# Patient Record
Sex: Female | Born: 1960
Health system: Southern US, Community
[De-identification: ages and names within clinical notes are randomized; demographics above are authoritative.]

## PROBLEM LIST (undated history)

## (undated) DIAGNOSIS — IMO0002 Reserved for concepts with insufficient information to code with codable children: Secondary | ICD-10-CM

## (undated) DIAGNOSIS — F419 Anxiety disorder, unspecified: Secondary | ICD-10-CM

## (undated) DIAGNOSIS — E0520 Thyrotoxicosis with toxic multinodular goiter without thyrotoxic crisis or storm: Secondary | ICD-10-CM

## (undated) DIAGNOSIS — I82409 Acute embolism and thrombosis of unspecified deep veins of unspecified lower extremity: Secondary | ICD-10-CM

## (undated) DIAGNOSIS — T883XXA Malignant hyperthermia due to anesthesia, initial encounter: Secondary | ICD-10-CM

## (undated) HISTORY — PX: ADENOIDECTOMY: SHX5191

---

## 1982-09-20 HISTORY — PX: LAPAROTOMY: SHX154

## 2004-09-25 ENCOUNTER — Ambulatory Visit: Payer: Self-pay | Admitting: Internal Medicine

## 2005-11-01 ENCOUNTER — Ambulatory Visit: Payer: Self-pay | Admitting: Otolaryngology

## 2006-06-21 ENCOUNTER — Ambulatory Visit: Payer: Self-pay | Admitting: Family Medicine

## 2006-11-15 ENCOUNTER — Ambulatory Visit: Payer: Self-pay | Admitting: Internal Medicine

## 2007-10-04 DIAGNOSIS — R102 Pelvic and perineal pain unspecified side: Secondary | ICD-10-CM | POA: Insufficient documentation

## 2007-10-26 ENCOUNTER — Ambulatory Visit: Payer: Self-pay | Admitting: Urology

## 2007-11-01 ENCOUNTER — Ambulatory Visit: Payer: Self-pay | Admitting: Urology

## 2008-02-07 ENCOUNTER — Ambulatory Visit: Payer: Self-pay | Admitting: Family Medicine

## 2008-08-30 ENCOUNTER — Ambulatory Visit: Payer: Self-pay | Admitting: Family Medicine

## 2008-11-17 DIAGNOSIS — G4700 Insomnia, unspecified: Secondary | ICD-10-CM | POA: Insufficient documentation

## 2008-12-16 DIAGNOSIS — K219 Gastro-esophageal reflux disease without esophagitis: Secondary | ICD-10-CM | POA: Insufficient documentation

## 2009-04-07 ENCOUNTER — Ambulatory Visit: Payer: Self-pay | Admitting: Family Medicine

## 2009-06-17 ENCOUNTER — Ambulatory Visit: Payer: Self-pay

## 2009-07-14 ENCOUNTER — Ambulatory Visit: Payer: Self-pay | Admitting: Urology

## 2009-10-22 ENCOUNTER — Ambulatory Visit: Payer: Self-pay | Admitting: Internal Medicine

## 2009-11-23 ENCOUNTER — Emergency Department: Payer: Self-pay | Admitting: Emergency Medicine

## 2010-02-17 DIAGNOSIS — K5900 Constipation, unspecified: Secondary | ICD-10-CM | POA: Insufficient documentation

## 2010-03-24 ENCOUNTER — Ambulatory Visit: Payer: Self-pay | Admitting: Unknown Physician Specialty

## 2010-03-31 ENCOUNTER — Inpatient Hospital Stay: Payer: Self-pay | Admitting: Internal Medicine

## 2010-04-16 DIAGNOSIS — I744 Embolism and thrombosis of arteries of extremities, unspecified: Secondary | ICD-10-CM

## 2010-04-16 DIAGNOSIS — T883XXA Malignant hyperthermia due to anesthesia, initial encounter: Secondary | ICD-10-CM

## 2010-04-16 DIAGNOSIS — E559 Vitamin D deficiency, unspecified: Secondary | ICD-10-CM | POA: Insufficient documentation

## 2010-04-16 DIAGNOSIS — I742 Embolism and thrombosis of arteries of the upper extremities: Secondary | ICD-10-CM | POA: Insufficient documentation

## 2010-04-16 HISTORY — DX: Embolism and thrombosis of arteries of extremities, unspecified: I74.4

## 2010-04-16 HISTORY — DX: Malignant hyperthermia due to anesthesia, initial encounter: T88.3XXA

## 2010-04-20 ENCOUNTER — Emergency Department: Payer: Self-pay | Admitting: Emergency Medicine

## 2010-10-13 ENCOUNTER — Ambulatory Visit: Payer: Self-pay | Admitting: Internal Medicine

## 2010-10-13 ENCOUNTER — Ambulatory Visit: Payer: Self-pay | Admitting: Family Medicine

## 2010-10-29 ENCOUNTER — Ambulatory Visit: Payer: Self-pay | Admitting: Internal Medicine

## 2010-11-19 ENCOUNTER — Ambulatory Visit: Payer: Self-pay | Admitting: Internal Medicine

## 2010-12-05 LAB — CEA: CEA: 1 ng/mL (ref 0.0–4.7)

## 2010-12-05 LAB — CA 125: CA 125: 12.5 U/mL (ref 0.0–34.0)

## 2010-12-20 ENCOUNTER — Ambulatory Visit: Payer: Self-pay | Admitting: Internal Medicine

## 2011-01-13 ENCOUNTER — Ambulatory Visit: Payer: Self-pay | Admitting: Family Medicine

## 2011-01-25 ENCOUNTER — Ambulatory Visit: Payer: Self-pay

## 2012-03-15 ENCOUNTER — Ambulatory Visit: Payer: Self-pay | Admitting: Family Medicine

## 2012-03-15 DIAGNOSIS — M25559 Pain in unspecified hip: Secondary | ICD-10-CM | POA: Insufficient documentation

## 2012-09-06 LAB — HM PAP SMEAR: HM Pap smear: NEGATIVE

## 2012-09-18 ENCOUNTER — Other Ambulatory Visit: Payer: Self-pay | Admitting: Family Medicine

## 2012-09-18 NOTE — Telephone Encounter (Signed)
No paper chart °

## 2012-10-20 ENCOUNTER — Ambulatory Visit: Payer: Self-pay | Admitting: Family Medicine

## 2015-03-14 DIAGNOSIS — E0590 Thyrotoxicosis, unspecified without thyrotoxic crisis or storm: Secondary | ICD-10-CM | POA: Insufficient documentation

## 2015-03-14 DIAGNOSIS — R42 Dizziness and giddiness: Secondary | ICD-10-CM | POA: Insufficient documentation

## 2015-03-14 DIAGNOSIS — F419 Anxiety disorder, unspecified: Secondary | ICD-10-CM | POA: Insufficient documentation

## 2015-03-14 DIAGNOSIS — G7129 Other congenital myopathy: Secondary | ICD-10-CM | POA: Insufficient documentation

## 2015-03-14 DIAGNOSIS — G712 Congenital myopathies: Secondary | ICD-10-CM

## 2015-03-14 DIAGNOSIS — G7100 Muscular dystrophy, unspecified: Secondary | ICD-10-CM | POA: Insufficient documentation

## 2015-03-17 ENCOUNTER — Ambulatory Visit: Payer: Self-pay | Admitting: Physician Assistant

## 2015-03-17 ENCOUNTER — Other Ambulatory Visit: Payer: Self-pay | Admitting: Family Medicine

## 2015-03-17 DIAGNOSIS — F419 Anxiety disorder, unspecified: Secondary | ICD-10-CM

## 2015-03-17 NOTE — Telephone Encounter (Signed)
Printed, please fax or call in to pharmacy. Thank you.   

## 2015-03-25 ENCOUNTER — Telehealth: Payer: Self-pay | Admitting: Family Medicine

## 2015-03-25 DIAGNOSIS — J329 Chronic sinusitis, unspecified: Secondary | ICD-10-CM | POA: Insufficient documentation

## 2015-03-25 MED ORDER — AMOXICILLIN-POT CLAVULANATE 875-125 MG PO TABS: 1.0000 | ORAL_TABLET | Freq: Two times a day (BID) | ORAL | Status: DC

## 2015-03-25 NOTE — Telephone Encounter (Signed)
Pt advised.   Thanks,   -Kalif Kattner  

## 2015-03-25 NOTE — Telephone Encounter (Signed)
Pt is calling wanting something for sinus infection and chest cold.  Husband is at [facility] sick.  She wants to know if you can call her in something.  Her call back is [phone].  She uses [facility].  tp

## 2015-03-25 NOTE — Telephone Encounter (Signed)
Sent in rx. OV if not improved. Thanks.

## 2015-04-15 ENCOUNTER — Other Ambulatory Visit: Payer: Self-pay | Admitting: Family Medicine

## 2015-04-15 DIAGNOSIS — F419 Anxiety disorder, unspecified: Secondary | ICD-10-CM

## 2015-05-10 ENCOUNTER — Other Ambulatory Visit: Payer: Self-pay | Admitting: Family Medicine

## 2015-05-10 DIAGNOSIS — F341 Dysthymic disorder: Secondary | ICD-10-CM

## 2015-05-12 ENCOUNTER — Ambulatory Visit: Payer: Medicare Other | Admitting: Family Medicine

## 2015-05-12 NOTE — Telephone Encounter (Signed)
Last OV 04/2014 (No show 02/2015)  Thanks,   -Mickel Baas

## 2015-05-30 ENCOUNTER — Encounter: Payer: Self-pay | Admitting: Family Medicine

## 2015-05-30 ENCOUNTER — Ambulatory Visit (INDEPENDENT_AMBULATORY_CARE_PROVIDER_SITE_OTHER): Payer: Medicare Other | Admitting: Family Medicine

## 2015-05-30 ENCOUNTER — Ambulatory Visit: Payer: Medicare Other | Admitting: Family Medicine

## 2015-05-30 VITALS — BP 102/64 | HR 64 | Temp 98.4°F | Resp 16 | Ht 62.5 in | Wt 113.0 lb

## 2015-05-30 DIAGNOSIS — F419 Anxiety disorder, unspecified: Secondary | ICD-10-CM | POA: Diagnosis not present

## 2015-05-30 DIAGNOSIS — G4700 Insomnia, unspecified: Secondary | ICD-10-CM | POA: Diagnosis not present

## 2015-05-30 DIAGNOSIS — F341 Dysthymic disorder: Secondary | ICD-10-CM | POA: Diagnosis not present

## 2015-05-30 MED ORDER — CITALOPRAM HYDROBROMIDE 40 MG PO TABS: 40.0000 mg | ORAL_TABLET | Freq: Every day | ORAL | Status: DC

## 2015-05-30 MED ORDER — ALPRAZOLAM 0.5 MG PO TABS: 0.5000 mg | ORAL_TABLET | Freq: Every day | ORAL | Status: DC | PRN

## 2015-05-30 NOTE — Progress Notes (Signed)
Subjective:    Patient ID: Tara Scott, female    DOB: [birth year], 54 y.o.   MRN: [id number]  Depression      The patient presents with depression.  This is a chronic problem.  The current episode started more than 1 year ago.   The problem occurs daily.  The problem has been gradually worsening since onset.  Associated symptoms include decreased concentration and sad.  Associated symptoms include no suicidal ideas.     The symptoms are aggravated by family issues (Pt's husband recently passed away 7 weeks ago after battling liver dx).  Past treatments include SSRIs - Selective serotonin reuptake inhibitors (pt currently taking Celexa 40 mg and Xanax as noted. ).  Compliance with treatment is good.  Previous treatment provided mild relief.  Past medical history includes anxiety and depression.     Pertinent negatives include no bipolar disorder. Anxiety Presents for follow-up visit. Symptoms include decreased concentration. Patient reports no suicidal ideas. Symptoms occur constantly (pt reports she is having to take Xanax qd). The severity of symptoms is interfering with daily activities (pt reports she has not been able to go home after her husband's death. Pt currently staying with family). The symptoms are aggravated by family issues.   Her past medical history is significant for depression. Past treatments include benzodiazephines. The treatment provided mild relief. Compliance with prior treatments has been good. Compliance with medications is 76-100%.    Review of Systems  Psychiatric/Behavioral: Positive for depression and decreased concentration. Negative for suicidal ideas.   BP 102/64 mmHg  Pulse 64  Temp(Src) 98.4 F (36.9 C) (Oral)  Resp 16  Ht 5' 2.5" (1.588 m)  Wt 113 lb (51.256 kg)  BMI 20.33 kg/m2   Patient Active Problem List   Diagnosis Date Noted  . Sinusitis 03/25/2015  . Anxiety 03/14/2015  . 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 Dizziness 03/14/2015  . MD  (muscular dystrophy) 03/14/2015  . Hyperthyroidism, subclinical 03/14/2015  . Arthralgia of hip or thigh 03/15/2012  . Adverse effect of malignant hyperthermia 04/16/2010  . 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 Avitaminosis D 04/16/2010  . CN (constipation) 02/17/2010  . Goiter, nontoxic, multinodular 11/04/2009  . Absence of menstruation 08/18/2009  . Acid reflux 12/16/2008  . Cannot sleep 11/17/2008  . Adnexal pain 10/04/2007  . Depression, neurotic 09/01/2007   No past medical history on file. Current Outpatient Prescriptions on File Prior to Visit  Medication Sig  . ALPRAZolam (XANAX) 0.5 MG tablet TAKE 1 TABLET BY MOUTH EVERY DAY AS NEEDED  . citalopram (CELEXA) 40 MG tablet TAKE 1 TABLET BY MOUTH EVERY DAY FOR DEPRESSION   No current facility-administered medications on file prior to visit.   Allergies  Allergen Reactions  . Anesthetics, Halogenated Other (See Comments)  . Ciprofloxacin     swelling  . Methimazole     hives  . Penicillins Other (See Comments)    RXN AS A CHILD   Past Surgical History  Procedure Laterality Date  . Cesarean section    . Adenoidectomy    . Laparotomy  1984    OVARIAN CYST   Social History   Social History  . Marital Status: Widowed    Spouse Name: N/A  . Number of Children: N/A  . Years of Education: N/A   Occupational History  . Not on file.   Social History Main Topics  . Smoking status: Current Every Day Smoker -- 1.00 packs/day for 30  years    Types: Cigarettes  . Smokeless tobacco: Never Used  . Alcohol Use: No  . Drug Use: No  . Sexual Activity: Not on file   Other Topics Concern  . Not on file   Social History Narrative   Family History  Problem Relation Age of Onset  . CVA Mother   . Hypercholesterolemia Mother   . Depression Mother   . Rheum arthritis Mother   . Deep vein thrombosis Mother   . Bladder Cancer Father   . Diverticulitis Father   . Pulmonary embolism Father   .  Lung cancer Father   . Deep vein thrombosis Sister   . Colon polyps Sister   . Atrial fibrillation Sister       Objective:   Physical Exam  Constitutional: She is oriented to person, place, and time. She appears well-developed and well-nourished.  Neurological: She is alert and oriented to person, place, and time.  Psychiatric: She has a normal mood and affect. Her behavior is normal. Judgment and thought content normal.  BP 102/64 mmHg  Pulse 64  Temp(Src) 98.4 F (36.9 C) (Oral)  Resp 16  Ht 5' 2.5" (1.588 m)  Wt 113 lb (51.256 kg)  BMI 20.33 kg/m2     Assessment & Plan:  1. Anxiety Worsening since death of her husband. Will refill medication and monitor.  Still has not moved back home.  - ALPRAZolam (XANAX) 0.5 MG tablet; Take 1 tablet (0.5 mg total) by mouth daily as needed.  Dispense: 30 tablet; Refill: 5  2. Cannot sleep Will treat as above. Patient instructed to call back if condition worsens or does not improve.     3. Depression, neurotic Stable on current medication. Does not want to see a counselor at this time.  Stressed importance of not focusing on what ifs, but grieving the loss of her husband. Will work on this and on moving back home at some point.  Is currently staying with family. Call if does not continue to slowly improve. Also, consider a counselor.  - citalopram (CELEXA) 40 MG tablet; Take 1 tablet (40 mg total) by mouth daily.  Dispense: 30 tablet; Refill: 5  Margarita Rana, MD

## 2015-06-10 ENCOUNTER — Other Ambulatory Visit: Payer: Self-pay | Admitting: Family Medicine

## 2015-06-10 DIAGNOSIS — F419 Anxiety disorder, unspecified: Secondary | ICD-10-CM

## 2015-07-30 ENCOUNTER — Other Ambulatory Visit: Payer: Self-pay

## 2015-07-30 DIAGNOSIS — F419 Anxiety disorder, unspecified: Secondary | ICD-10-CM

## 2015-07-30 DIAGNOSIS — F341 Dysthymic disorder: Secondary | ICD-10-CM

## 2015-07-30 MED ORDER — CITALOPRAM HYDROBROMIDE 40 MG PO TABS: ORAL_TABLET | ORAL | Status: DC

## 2015-10-04 ENCOUNTER — Other Ambulatory Visit: Payer: Self-pay | Admitting: Family Medicine

## 2015-10-04 DIAGNOSIS — F419 Anxiety disorder, unspecified: Secondary | ICD-10-CM

## 2015-10-06 NOTE — Telephone Encounter (Signed)
Printed, please fax or call in to pharmacy. Thank you.   

## 2015-12-26 ENCOUNTER — Ambulatory Visit (INDEPENDENT_AMBULATORY_CARE_PROVIDER_SITE_OTHER): Payer: PPO | Admitting: Family Medicine

## 2015-12-26 ENCOUNTER — Encounter: Payer: Self-pay | Admitting: Family Medicine

## 2015-12-26 VITALS — BP 110/66 | HR 72 | Temp 98.0°F | Resp 16 | Ht 62.0 in | Wt 117.0 lb

## 2015-12-26 DIAGNOSIS — G250 Essential tremor: Secondary | ICD-10-CM | POA: Insufficient documentation

## 2015-12-26 DIAGNOSIS — Z1211 Encounter for screening for malignant neoplasm of colon: Secondary | ICD-10-CM | POA: Diagnosis not present

## 2015-12-26 DIAGNOSIS — N898 Other specified noninflammatory disorders of vagina: Secondary | ICD-10-CM

## 2015-12-26 DIAGNOSIS — F419 Anxiety disorder, unspecified: Secondary | ICD-10-CM | POA: Diagnosis not present

## 2015-12-26 DIAGNOSIS — L298 Other pruritus: Secondary | ICD-10-CM | POA: Diagnosis not present

## 2015-12-26 DIAGNOSIS — E0590 Thyrotoxicosis, unspecified without thyrotoxic crisis or storm: Secondary | ICD-10-CM

## 2015-12-26 DIAGNOSIS — Z1239 Encounter for other screening for malignant neoplasm of breast: Secondary | ICD-10-CM | POA: Diagnosis not present

## 2015-12-26 DIAGNOSIS — Z0000 Encounter for general adult medical examination without abnormal findings: Secondary | ICD-10-CM | POA: Diagnosis not present

## 2015-12-26 NOTE — Patient Instructions (Signed)
Essential Tremor A tremor is trembling or shaking that you cannot control. Most tremors affect the hands or arms. Tremors can also affect the head, vocal cords, face, and other parts of the body.  Essential tremor is a tremor without a known cause.  CAUSES Essential tremor has no known cause.  RISK FACTORS You may be at greater risk of essential tremor if:   You have a family member with essential tremor.   You are age 56 or older.   You take certain medicines. SIGNS AND SYMPTOMS The main sign of a tremor is uncontrolled and unintentional rhythmic shaking of a body part.  You may have difficulty eating with a spoon or fork.   You may have difficulty writing.   You may nod your head up and down or side to side.   You may have a quivering voice.  Your tremors:  May get worse over time.   May come and go.   May be more noticeable on one side of your body.   May get worse due to stress, fatigue, caffeine, and extreme heat or cold.  DIAGNOSIS Your health care provider can diagnose essential tremor based on your symptoms, medical history, and a physical examination. There is no single test to diagnose an essential tremor. However, your health care provider may perform a variety of tests to rule out other conditions. Tests may include:   Blood and urine tests.   Imaging studies of your brain, such as:   CT scan.   MRI.   A test that measures involuntary muscle movement (electromyogram). TREATMENT Your tremors may go away without treatment. Mild tremors may not need treatment if they do not affect your day-to-day life. Severe tremors may need to be treated using one or a combination of the following options:   Medicines. This may include medicine that is injected.  Lifestyle changes.   Physical therapy.  HOME CARE INSTRUCTIONS  Take medicines only as directed by your health care provider.   Limit alcohol intake to no more than 1 drink per day for  nonpregnant women and 2 drinks per day for men. One drink equals 12 oz of beer, 5 oz of wine, or 1 oz of hard liquor.  Do not use any tobacco products, including cigarettes, chewing tobacco, or electronic cigarettes. If you need help quitting, ask your health care provider.  Take medicines only as directed by your health care provider.   Avoid extreme heat or cold.   Limit the amount of caffeine you consumeas directed by your health care provider.   Try to get eight hours of sleep each night.  Find ways to manage your stress, such as meditation or yoga.  Keep all follow-up visits as directed by your health care provider. This is important. This includes any physical therapy visits. SEEK MEDICAL CARE IF:  You experience any changes in the location or intensity of your tremors.   You start having a tremor after starting a new medicine.   You have tremor with other symptoms such as:   Numbness.   Tingling.   Pain.   Weakness.   Your tremor gets worse.   Your tremor interferes with your daily life.    This information is not intended to replace advice given to you by your health care provider. Make sure you discuss any questions you have with your health care provider.   Document Released: 09/27/2014 Document Reviewed: 09/27/2014 Elsevier Interactive Patient Education 2016 Elsevier Inc.  

## 2015-12-26 NOTE — Progress Notes (Signed)
Patient ID: Tara Scott, female   DOB: [birth year], 55 y.o.   MRN: [id number]       Patient: Tara Scott, Female    DOB: 1961/02/23, 55 y.o.   MRN: [id number] Visit Date: 12/26/2015  Today's Provider: Margarita Rana, MD   Chief Complaint  Patient presents with  . Medicare Wellness   Subjective:    Annual wellness visit Tara Scott is a 55 y.o. female. She feels well. She reports exercising none. She reports she is sleeping poorly.  05/21/15 CPE 09/06/12 Pap-neg 01/13/11 Mammogram-BI-RADS 2  -----------------------------------------------------------   Review of Systems  Constitutional: Positive for activity change.       Crying  HENT: Negative.   Eyes: Positive for photophobia.  Respiratory: Negative.   Cardiovascular: Positive for leg swelling.  Gastrointestinal: Positive for constipation.  Endocrine: Negative.   Genitourinary: Negative.        Itching  Musculoskeletal: Positive for back pain, neck pain and neck stiffness.  Skin: Negative.   Allergic/Immunologic: Negative.   Neurological: Negative.   Hematological: Negative.   Psychiatric/Behavioral: Positive for decreased concentration.    Social History   Social History  . Marital Status: Widowed    Spouse Name: N/A  . Number of Children: N/A  . Years of Education: N/A   Occupational History  . Not on file.   Social History Main Topics  . Smoking status: Current Every Day Smoker -- 1.00 packs/day for 30 years    Types: Cigarettes  . Smokeless tobacco: Never Used  . Alcohol Use: No  . Drug Use: No  . Sexual Activity: Not on file   Other Topics Concern  . Not on file   Social History Narrative    History reviewed. No pertinent past medical history.   Patient Active Problem List   Diagnosis Date Noted  . Sinusitis 03/25/2015  . Anxiety 03/14/2015  . Central core disease ([place]) 03/14/2015  . Dizziness 03/14/2015  . MD (muscular dystrophy) ([place]) 03/14/2015  . Hyperthyroidism, subclinical  03/14/2015  . Arthralgia of hip or thigh 03/15/2012  . Adverse effect of malignant hyperthermia 04/16/2010  . 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 Avitaminosis D 04/16/2010  . CN (constipation) 02/17/2010  . Goiter, nontoxic, multinodular 11/04/2009  . Absence of menstruation 08/18/2009  . Acid reflux 12/16/2008  . Cannot sleep 11/17/2008  . Adnexal pain 10/04/2007  . Depression, neurotic 09/01/2007    Past Surgical History  Procedure Laterality Date  . Cesarean section    . Adenoidectomy    . Laparotomy  1984    OVARIAN CYST    Her family history includes Atrial fibrillation in her sister; Bladder Cancer in her father; CVA in her mother; Colon polyps in her sister; Deep vein thrombosis in her mother and sister; Depression in her mother; Diverticulitis in her father; Hypercholesterolemia in her mother; Lung cancer in her father; Pulmonary embolism in her father; Rheum arthritis in her mother.    Previous Medications   ALPRAZOLAM (XANAX) 0.5 MG TABLET    TAKE 1 TABLET BY MOUTH EVERY DAY AS NEEDED   CITALOPRAM (CELEXA) 40 MG TABLET    Take 1 tablet (40 mg total) by mouth daily.    Patient Care Team: Margarita Rana, MD as PCP - General (Family Medicine)     Objective:   Vitals: BP 110/66 mmHg  Pulse 72  Temp(Src) 98 F (36.7 C) (Oral)  Resp 16  Ht 5\' 2"  (1.575 m)  Wt 117 lb (53.071 kg)  BMI 21.39 kg/m2  SpO2 98%  Physical Exam  Constitutional: She is oriented to person, place, and time. She appears well-developed and well-nourished.  HENT:  Head: Normocephalic and atraumatic.  Right Ear: Tympanic membrane, external ear and ear canal normal.  Left Ear: Tympanic membrane, external ear and ear canal normal.  Nose: Nose normal.  Mouth/Throat: Uvula is midline, oropharynx is clear and moist and mucous membranes are normal.  Eyes: Conjunctivae, EOM and lids are normal. Pupils are equal, round, and reactive to light.  Neck: Trachea normal and normal  range of motion. Neck supple. Carotid bruit is not present. No thyroid mass and no thyromegaly present.  Cardiovascular: Normal rate, regular rhythm and normal heart sounds.   Pulmonary/Chest: Effort normal and breath sounds normal.  Abdominal: Soft. Normal appearance and bowel sounds are normal. There is no hepatosplenomegaly. There is no tenderness.  Genitourinary: No breast swelling, tenderness or discharge. There is erythema in the vagina.  Musculoskeletal: Normal range of motion.  Lymphadenopathy:    She has no cervical adenopathy.    She has no axillary adenopathy.  Neurological: She is alert and oriented to person, place, and time. She has normal strength. No cranial nerve deficit.  Skin: Skin is warm, dry and intact.  Psychiatric: She has a normal mood and affect. Her speech is normal and behavior is normal. Judgment and thought content normal. Cognition and memory are normal.    Activities of Daily Living In your present state of health, do you have any difficulty performing the following activities: 12/26/2015 12/26/2015  Hearing? N N  Vision? Y N  Difficulty concentrating or making decisions? Y N  Walking or climbing stairs? Y N  Dressing or bathing? N N  Doing errands, shopping? N N    Fall Risk Assessment Fall Risk  12/26/2015  Falls in the past year? No     Depression Screen PHQ 2/9 Scores 12/26/2015  PHQ - 2 Score 4  PHQ- 9 Score 13    Cognitive Testing - 6-CIT  Correct? Score   What year is it? yes 0 0 or 4  What month is it? yes 0 0 or 3  Memorize:    Pia Mau,  41,  [address],  [place],      What time is it? (within 1 hour) yes 0 0 or 3  Count backwards from 20 yes 0 0, 2, or 4  Name the months of the year yes 0 0, 2, or 4  Repeat name & address above no 4 0, 2, 4, 6, 8, or 10       TOTAL SCORE  4/28   Interpretation:  Normal  Normal (0-7) Abnormal (8-28)       Assessment & Plan:     Annual Wellness Visit  Reviewed patient's Family Medical  History Reviewed and updated list of patient's medical providers Assessment of cognitive impairment was done Assessed patient's functional ability Established a written schedule for health screening [place] Completed and Reviewed  Exercise Activities and Dietary recommendations Goals    None      Immunization History  Administered Date(s) Administered  . Pneumococcal Polysaccharide-23 07/27/2011       1. Medicare annual wellness visit, subsequent Stable. Patient advised to continue eating healthy and exercise daily.  2. Hyperthyroidism, subclinical - CBC with Differential/Platelet - Comprehensive metabolic panel - TSH  3. Anxiety Stable. Patient advised to continue current medication and plan of care.   4. Benign essential tremor New  problem. Improving.Suspect related to stress.  Patient advised she will follow-up with her neurologist to make sure is correct diagnosis.    5. Breast cancer screening - [facility] DIGITAL SCREENING BILATERAL; Future  6. Colon cancer screening - Ambulatory referral to Gastroenterology  7. Vaginal itching New problem. Worsening. F/U pending lab report. - NuSwab Vaginitis Plus (VG+)     Patient seen and examined by Dr. Jerrell Belfast, and note scribed by Philbert Riser. Dimas, CMA.  I have reviewed the document for accuracy and completeness and I agree with above. Jerrell Belfast, MD   Margarita Rana, MD   ------------------------------------------------------------------------------------------------------------

## 2015-12-27 LAB — COMPREHENSIVE METABOLIC PANEL
ALBUMIN: 4.4 g/dL (ref 3.5–5.5)
ALK PHOS: 80 IU/L (ref 39–117)
ALT: 20 IU/L (ref 0–32)
AST: 23 IU/L (ref 0–40)
Albumin/Globulin Ratio: 1.6 (ref 1.2–2.2)
BUN / CREAT RATIO: 32 — AB (ref 9–23)
BUN: 12 mg/dL (ref 6–24)
Bilirubin Total: 0.4 mg/dL (ref 0.0–1.2)
CO2: 27 mmol/L (ref 18–29)
CREATININE: 0.37 mg/dL — AB (ref 0.57–1.00)
Calcium: 9.5 mg/dL (ref 8.7–10.2)
Chloride: 98 mmol/L (ref 96–106)
GFR calc Af Amer: 139 mL/min/{1.73_m2} (ref >59)
GFR calc non Af Amer: 121 mL/min/{1.73_m2} (ref >59)
Globulin, Total: 2.8 g/dL (ref 1.5–4.5)
Glucose: 74 mg/dL (ref 65–99)
Potassium: 4.3 mmol/L (ref 3.5–5.2)
Sodium: 140 mmol/L (ref 134–144)
Total Protein: 7.2 g/dL (ref 6.0–8.5)

## 2015-12-27 LAB — CBC WITH DIFFERENTIAL/PLATELET
BASOS ABS: 0 10*3/uL (ref 0.0–0.2)
BASOS: 1 %
EOS (ABSOLUTE): 0 10*3/uL (ref 0.0–0.4)
EOS: 1 %
Hematocrit: 41.8 % (ref 34.0–46.6)
Hemoglobin: 14 g/dL (ref 11.1–15.9)
IMMATURE GRANS (ABS): 0 10*3/uL (ref 0.0–0.1)
Immature Granulocytes: 0 %
LYMPHS: 33 %
Lymphocytes Absolute: 1.8 10*3/uL (ref 0.7–3.1)
MCH: 32.3 pg (ref 26.6–33.0)
MCHC: 33.5 g/dL (ref 31.5–35.7)
MCV: 96 fL (ref 79–97)
MONOCYTES: 8 %
Monocytes Absolute: 0.4 10*3/uL (ref 0.1–0.9)
NEUTROS ABS: 3 10*3/uL (ref 1.4–7.0)
Neutrophils: 57 %
PLATELETS: 253 10*3/uL (ref 150–379)
RBC: 4.34 x10E6/uL (ref 3.77–5.28)
RDW: 13.1 % (ref 12.3–15.4)
WBC: 5.3 10*3/uL (ref 3.4–10.8)

## 2015-12-27 LAB — TSH: TSH: 0.016 u[IU]/mL — AB (ref 0.450–4.500)

## 2015-12-29 ENCOUNTER — Telehealth: Payer: Self-pay

## 2015-12-29 LAB — NUSWAB VAGINITIS PLUS (VG+)
Candida albicans, NAA: NEGATIVE
Candida glabrata, NAA: NEGATIVE
Chlamydia trachomatis, NAA: NEGATIVE
Neisseria gonorrhoeae, NAA: NEGATIVE
Trich vag by NAA: NEGATIVE

## 2015-12-29 NOTE — Telephone Encounter (Signed)
-----   Message from Margarita Rana, MD sent at 12/27/2015  2:03 PM EDT ----- Labs stable except for subclinical hyperthyroidism, would recommend recheck TSH and t4 in 6 weeks to make sure not contributing to her tremor.  Thanks.

## 2015-12-29 NOTE — Telephone Encounter (Signed)
-----   Message from Margarita Rana, MD sent at 12/29/2015 10:31 AM EDT ----- Nuswab negative. No cause for itching found. Gyn referral if patient would like. Thanks.

## 2015-12-29 NOTE — Telephone Encounter (Signed)
LMTCB. sd  

## 2015-12-29 NOTE — Telephone Encounter (Signed)
Patient advised as below. Patient reports she will endo and have them follow-up her thyroid and she will call back later on for GYN referral if symptoms persist.

## 2016-01-02 ENCOUNTER — Ambulatory Visit: Payer: Medicare Other | Admitting: Family Medicine

## 2016-01-20 DIAGNOSIS — Z1212 Encounter for screening for malignant neoplasm of rectum: Secondary | ICD-10-CM | POA: Diagnosis not present

## 2016-01-20 DIAGNOSIS — Z1211 Encounter for screening for malignant neoplasm of colon: Secondary | ICD-10-CM | POA: Diagnosis not present

## 2016-01-20 DIAGNOSIS — Z8371 Family history of colonic polyps: Secondary | ICD-10-CM | POA: Diagnosis not present

## 2016-02-13 ENCOUNTER — Encounter: Payer: Medicare Other | Admitting: Family Medicine

## 2016-03-01 DIAGNOSIS — H43813 Vitreous degeneration, bilateral: Secondary | ICD-10-CM | POA: Diagnosis not present

## 2016-03-04 DIAGNOSIS — H2513 Age-related nuclear cataract, bilateral: Secondary | ICD-10-CM | POA: Diagnosis not present

## 2016-03-04 DIAGNOSIS — H43813 Vitreous degeneration, bilateral: Secondary | ICD-10-CM | POA: Diagnosis not present

## 2016-03-04 DIAGNOSIS — H52223 Regular astigmatism, bilateral: Secondary | ICD-10-CM | POA: Diagnosis not present

## 2016-03-04 DIAGNOSIS — H43313 Vitreous membranes and strands, bilateral: Secondary | ICD-10-CM | POA: Diagnosis not present

## 2016-03-05 ENCOUNTER — Encounter: Payer: Self-pay | Admitting: *Deleted

## 2016-03-08 ENCOUNTER — Encounter: Payer: Self-pay | Admitting: Anesthesiology

## 2016-03-08 ENCOUNTER — Ambulatory Visit: Payer: PPO | Admitting: Anesthesiology

## 2016-03-08 ENCOUNTER — Ambulatory Visit
Admission: RE | Admit: 2016-03-08 | Discharge: 2016-03-08 | Disposition: A | Discharge status: Home Health | Payer: PPO | Source: Ambulatory Visit | Attending: Unknown Physician Specialty | Admitting: Unknown Physician Specialty

## 2016-03-08 ENCOUNTER — Encounter
Admission: RE | Disposition: A | Discharge status: Home Health | Payer: Self-pay | Source: Ambulatory Visit | Attending: Unknown Physician Specialty

## 2016-03-08 DIAGNOSIS — K5790 Diverticulosis of intestine, part unspecified, without perforation or abscess without bleeding: Secondary | ICD-10-CM | POA: Diagnosis not present

## 2016-03-08 DIAGNOSIS — Z86718 Personal history of other venous thrombosis and embolism: Secondary | ICD-10-CM | POA: Insufficient documentation

## 2016-03-08 DIAGNOSIS — D128 Benign neoplasm of rectum: Secondary | ICD-10-CM | POA: Diagnosis not present

## 2016-03-08 DIAGNOSIS — D123 Benign neoplasm of transverse colon: Secondary | ICD-10-CM | POA: Diagnosis not present

## 2016-03-08 DIAGNOSIS — F419 Anxiety disorder, unspecified: Secondary | ICD-10-CM | POA: Insufficient documentation

## 2016-03-08 DIAGNOSIS — F329 Major depressive disorder, single episode, unspecified: Secondary | ICD-10-CM | POA: Insufficient documentation

## 2016-03-08 DIAGNOSIS — K621 Rectal polyp: Secondary | ICD-10-CM | POA: Diagnosis not present

## 2016-03-08 DIAGNOSIS — K635 Polyp of colon: Secondary | ICD-10-CM | POA: Diagnosis not present

## 2016-03-08 DIAGNOSIS — F418 Other specified anxiety disorders: Secondary | ICD-10-CM | POA: Diagnosis not present

## 2016-03-08 DIAGNOSIS — K640 First degree hemorrhoids: Secondary | ICD-10-CM | POA: Insufficient documentation

## 2016-03-08 DIAGNOSIS — G709 Myoneural disorder, unspecified: Secondary | ICD-10-CM | POA: Diagnosis not present

## 2016-03-08 DIAGNOSIS — E0590 Thyrotoxicosis, unspecified without thyrotoxic crisis or storm: Secondary | ICD-10-CM | POA: Diagnosis not present

## 2016-03-08 DIAGNOSIS — K5730 Diverticulosis of large intestine without perforation or abscess without bleeding: Secondary | ICD-10-CM | POA: Diagnosis not present

## 2016-03-08 DIAGNOSIS — F17210 Nicotine dependence, cigarettes, uncomplicated: Secondary | ICD-10-CM | POA: Diagnosis not present

## 2016-03-08 DIAGNOSIS — Z1211 Encounter for screening for malignant neoplasm of colon: Secondary | ICD-10-CM | POA: Insufficient documentation

## 2016-03-08 DIAGNOSIS — K219 Gastro-esophageal reflux disease without esophagitis: Secondary | ICD-10-CM | POA: Insufficient documentation

## 2016-03-08 DIAGNOSIS — Z880 Allergy status to penicillin: Secondary | ICD-10-CM | POA: Diagnosis not present

## 2016-03-08 DIAGNOSIS — Z8371 Family history of colonic polyps: Secondary | ICD-10-CM | POA: Diagnosis not present

## 2016-03-08 DIAGNOSIS — Z79899 Other long term (current) drug therapy: Secondary | ICD-10-CM | POA: Insufficient documentation

## 2016-03-08 DIAGNOSIS — E0520 Thyrotoxicosis with toxic multinodular goiter without thyrotoxic crisis or storm: Secondary | ICD-10-CM | POA: Diagnosis not present

## 2016-03-08 HISTORY — DX: Malignant hyperthermia due to anesthesia, initial encounter: T88.3XXA

## 2016-03-08 HISTORY — DX: Reserved for concepts with insufficient information to code with codable children: IMO0002

## 2016-03-08 HISTORY — PX: COLONOSCOPY WITH PROPOFOL: SHX5780

## 2016-03-08 HISTORY — DX: Anxiety disorder, unspecified: F41.9

## 2016-03-08 HISTORY — DX: Acute embolism and thrombosis of unspecified deep veins of unspecified lower extremity: I82.409

## 2016-03-08 HISTORY — DX: Thyrotoxicosis with toxic multinodular goiter without thyrotoxic crisis or storm: E05.20

## 2016-03-08 LAB — SURGICAL PATHOLOGY

## 2016-03-08 SURGERY — COLONOSCOPY WITH PROPOFOL
Anesthesia: General

## 2016-03-08 MED ORDER — PROPOFOL 500 MG/50ML IV EMUL
INTRAVENOUS | Status: DC | PRN
  Administered 2016-03-08: 50 ug/kg/min via INTRAVENOUS

## 2016-03-08 MED ORDER — SODIUM CHLORIDE 0.9 % IV SOLN: INTRAVENOUS | Status: DC

## 2016-03-08 MED ORDER — FENTANYL CITRATE (PF) 100 MCG/2ML IJ SOLN
INTRAMUSCULAR | Status: DC | PRN
  Administered 2016-03-08: 50 ug via INTRAVENOUS

## 2016-03-08 MED ORDER — MIDAZOLAM HCL 5 MG/5ML IJ SOLN
INTRAMUSCULAR | Status: AC
  Administered 2016-03-08 (×2): 1 mg via INTRAVENOUS
  Filled 2016-03-08: qty 5

## 2016-03-08 MED ORDER — SODIUM CHLORIDE 0.9 % IV SOLN
INTRAVENOUS | Status: DC
Start: 2016-03-08 — End: 2016-03-08
  Administered 2016-03-08: 13:00:00 via INTRAVENOUS

## 2016-03-08 MED ORDER — PROPOFOL 10 MG/ML IV BOLUS
INTRAVENOUS | Status: DC | PRN
  Administered 2016-03-08: 10 mg via INTRAVENOUS
  Administered 2016-03-08: 30 mg via INTRAVENOUS

## 2016-03-08 MED ORDER — LIDOCAINE HCL (PF) 2 % IJ SOLN
INTRAMUSCULAR | Status: DC | PRN
  Administered 2016-03-08: 50 mg

## 2016-03-08 NOTE — Anesthesia Preprocedure Evaluation (Addendum)
Anesthesia Evaluation  Patient identified by MRN, date of birth, ID band Patient awake    Reviewed: Allergy & Precautions, NPO status , Patient's Chart, lab work & pertinent test results, reviewed documented beta blocker date and time   Airway Mallampati: II  TM Distance: >3 FB     Dental  (+) Chipped   Pulmonary Current Smoker,           Cardiovascular      Neuro/Psych PSYCHIATRIC DISORDERS Anxiety Depression  Neuromuscular disease    GI/Hepatic GERD  ,  Endo/Other  Hyperthyroidism   Renal/GU      Musculoskeletal   Abdominal   Peds  Hematology   Anesthesia Other Findings Neck movement OK. MH. Discussed with pt. Obviously will avoid triggering agents.  Reproductive/Obstetrics                            Anesthesia Physical Anesthesia Plan  ASA: III  Anesthesia Plan: General   Post-op Pain Management:    Induction: Intravenous  Airway Management Planned: Nasal Cannula  Additional Equipment:   Intra-op Plan:   Post-operative Plan:   Informed Consent: I have reviewed the patients History and Physical, chart, labs and discussed the procedure including the risks, benefits and alternatives for the proposed anesthesia with the patient or authorized representative who has indicated his/her understanding and acceptance.     Plan Discussed with: CRNA  Anesthesia Plan Comments:         Anesthesia Quick Evaluation

## 2016-03-08 NOTE — Transfer of Care (Signed)
Immediate Anesthesia Transfer of Care Note  Patient: Tara Scott  Procedure(s) Performed: Procedure(s): COLONOSCOPY WITH PROPOFOL (N/A)  Patient Location: PACU  Anesthesia Type:General  Level of Consciousness: sedated  Airway & Oxygen Therapy: Patient Spontanous Breathing and Patient connected to nasal cannula oxygen  Post-op Assessment: Report given to RN and Post -op Vital signs reviewed and stable  Post vital signs: Reviewed and stable  Last Vitals:  Filed Vitals:   03/08/16 1244  BP: 140/70  Pulse: 73  Temp: 36.4 C  Resp: 20    Last Pain: There were no vitals filed for this visit.       Complications: No apparent anesthesia complications

## 2016-03-08 NOTE — H&P (Signed)
   Primary Care Physician:  Margarita Rana, MD Primary Gastroenterologist:  Dr. Vira Agar  Pre-Procedure History & Physical: HPI:  Tara Scott is a 55 y.o. female is here for an colonoscopy.   Past Medical History  Diagnosis Date  . Anxiety   . Malignant hyperthermia   . DVT (deep venous thrombosis) ([place])   . Toxic multinodular goiter   . 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 Cesarean section    . Adenoidectomy    . Laparotomy  1984    OVARIAN CYST    Prior to Admission medications   Medication Sig Start Date End Date Taking? Authorizing Provider  ALPRAZolam Duanne Moron) 0.5 MG tablet TAKE 1 TABLET BY MOUTH EVERY DAY AS NEEDED 10/06/15  Yes Margarita Rana, MD  citalopram (CELEXA) 40 MG tablet Take 1 tablet (40 mg total) by mouth daily. 05/30/15  Yes Margarita Rana, MD    Allergies as of 02/19/2016 - Review Complete 12/27/2015  Allergen Reaction Noted  . Anesthetics, halogenated Other (See Comments) 05/30/2015  . Ciprofloxacin  03/14/2015  . Methimazole  03/14/2015  . Penicillins Other (See Comments) 05/30/2015    Family History  Problem Relation Age of Onset  . CVA Mother   . Hypercholesterolemia Mother   . Depression Mother   . Rheum arthritis Mother   . Deep vein thrombosis Mother   . Bladder Cancer Father   . Diverticulitis Father   . Pulmonary embolism Father   . Lung cancer Father   . Deep vein thrombosis Sister   . Colon polyps Sister   . Atrial fibrillation Sister     Social History   Social History  . Marital Status: Widowed    Spouse Name: N/A  . Number of Children: N/A  . Years of Education: N/A   Occupational History  . Not on file.   Social History Main Topics  . Smoking status: Current Every Day Smoker -- 1.00 packs/day for 30 years    Types: Cigarettes  . Smokeless tobacco: Never Used  . Alcohol Use: No  . Drug Use: No  . Sexual Activity: Not  on file   Other Topics Concern  . Not on file   Social History Narrative    Review of Systems: See HPI, otherwise negative ROS  Physical Exam: BP 140/70 mmHg  Pulse 73  Temp(Src) 97.6 F (36.4 C) (Tympanic)  Resp 20  Ht 5\' 2"  (1.575 m)  Wt 52.164 kg (115 lb)  BMI 21.03 kg/m2  SpO2 100% General:   Alert,  pleasant and cooperative in NAD Head:  Normocephalic and atraumatic. Neck:  Supple; no masses or thyromegaly. Lungs:  Clear throughout to auscultation.    Heart:  Regular rate and rhythm. Abdomen:  Soft, nontender and nondistended. Normal bowel sounds, without guarding, and without rebound.   Neurologic:  Alert and  oriented x4;  grossly normal neurologically.  Impression/Plan: Tara Scott is here for an colonoscopy to be performed for screening  Risks, benefits, limitations, and alternatives regarding  colonoscopy have been reviewed with the patient.  Questions have been answered.  All parties agreeable.   Gaylyn Cheers, MD  03/08/2016, 1:10 PM

## 2016-03-08 NOTE — Anesthesia Postprocedure Evaluation (Signed)
Anesthesia Post Note  Patient: Tara Scott  Procedure(s) Performed: Procedure(s) (LRB): COLONOSCOPY WITH PROPOFOL (N/A)  Patient location during evaluation: Endoscopy Anesthesia Type: General Level of consciousness: awake and alert Pain management: pain level controlled Vital Signs Assessment: post-procedure vital signs reviewed and stable Respiratory status: spontaneous breathing, nonlabored ventilation, respiratory function stable and patient connected to nasal cannula oxygen Cardiovascular status: blood pressure returned to baseline and stable Postop Assessment: no signs of nausea or vomiting Anesthetic complications: no    Last Vitals:  Filed Vitals:   03/08/16 1407 03/08/16 1417  BP: 137/96 135/64  Pulse: 51 55  Temp:    Resp: 16 14    Last Pain: There were no vitals filed for this visit.               Joyelle Siedlecki S

## 2016-03-08 NOTE — Op Note (Addendum)
[facility] Gastroenterology Patient Name: Tara Scott Procedure Date: 03/08/2016 1:14 PM MRN: [id number] Account #: [id number] Date of Birth: 1960-12-25 Admit Type: Outpatient Age: 55 Room: [facility] ENDO ROOM 1 Gender: Female Note Status: Finalized Procedure:            Colonoscopy Indications:          Colon cancer screening in patient at increased risk:                        Family history of 1st-degree relative with colon polyps Providers:            Manya Silvas, MD Referring MD:         Jerrell Belfast, MD (Referring MD) Medicines:            Propofol per Anesthesia Complications:        No immediate complications. Procedure:            Pre-Anesthesia Assessment:                       - After reviewing the risks and benefits, the patient                        was deemed in satisfactory condition to undergo the                        procedure.                       After obtaining informed consent, the colonoscope was                        passed under direct vision. Throughout the procedure,                        the patient's blood pressure, pulse, and oxygen                        saturations were monitored continuously. The                        Colonoscope was introduced through the anus and                        advanced to the the cecum, identified by appendiceal                        orifice and ileocecal valve. The colonoscopy was                        performed without difficulty. The patient tolerated the                        procedure well. The quality of the bowel preparation                        was excellent. Findings:      A diminutive polyp was found in the proximal transverse colon. The polyp       was sessile. The polyp was removed with a jumbo cold forceps. Resection       and retrieval were complete.  Three sessile polyps were found in the rectum. The polyps were       diminutive in size. These polyps were removed  with a jumbo cold forceps.       Resection and retrieval were complete.      A small polyp was found in the rectum. The polyp was sessile. The polyp       was removed with a cold snare. Resection and retrieval were complete.      Internal hemorrhoids were found during endoscopy. The hemorrhoids were       small and Grade I (internal hemorrhoids that do not prolapse).      A few small-mouthed diverticula were found in the sigmoid colon.      The exam was otherwise without abnormality. Impression:           - One diminutive polyp in the proximal transverse                        colon, removed with a jumbo cold forceps. Resected and                        retrieved.                       - Three diminutive polyps in the rectum, removed with a                        jumbo cold forceps. Resected and retrieved.                       - One small polyp in the rectum, removed with a cold                        snare. Resected and retrieved.                       - The examination was otherwise normal.                       - Internal hemorrhoids. Recommendation:       - Await pathology results. Manya Silvas, MD 03/08/2016 1:49:19 PM This report has been signed electronically. Number of Addenda: 0 Note Initiated On: 03/08/2016 1:14 PM Scope Withdrawal Time: 0 hours 19 minutes 42 seconds  Total Procedure Duration: 0 hours 27 minutes 27 seconds       [facility]

## 2016-03-09 ENCOUNTER — Encounter: Payer: Self-pay | Admitting: Unknown Physician Specialty

## 2016-03-15 ENCOUNTER — Other Ambulatory Visit: Payer: Self-pay | Admitting: Family Medicine

## 2016-03-15 DIAGNOSIS — F419 Anxiety disorder, unspecified: Secondary | ICD-10-CM

## 2016-05-18 DIAGNOSIS — H43813 Vitreous degeneration, bilateral: Secondary | ICD-10-CM | POA: Diagnosis not present

## 2016-05-22 ENCOUNTER — Other Ambulatory Visit: Payer: Self-pay | Admitting: Family Medicine

## 2016-05-22 DIAGNOSIS — F419 Anxiety disorder, unspecified: Secondary | ICD-10-CM

## 2016-05-25 NOTE — Telephone Encounter (Signed)
Called in to pharmacy. Emily Drozdowski, CMA  

## 2016-06-14 ENCOUNTER — Ambulatory Visit (INDEPENDENT_AMBULATORY_CARE_PROVIDER_SITE_OTHER): Payer: PPO | Admitting: Physician Assistant

## 2016-06-14 ENCOUNTER — Encounter: Payer: Self-pay | Admitting: Physician Assistant

## 2016-06-14 VITALS — BP 140/80 | HR 64 | Temp 97.9°F | Resp 16 | Wt 119.0 lb

## 2016-06-14 DIAGNOSIS — E042 Nontoxic multinodular goiter: Secondary | ICD-10-CM

## 2016-06-14 DIAGNOSIS — G7100 Muscular dystrophy, unspecified: Secondary | ICD-10-CM

## 2016-06-14 DIAGNOSIS — T883XXS Malignant hyperthermia due to anesthesia, sequela: Secondary | ICD-10-CM | POA: Diagnosis not present

## 2016-06-14 DIAGNOSIS — M545 Low back pain, unspecified: Secondary | ICD-10-CM

## 2016-06-14 DIAGNOSIS — E0590 Thyrotoxicosis, unspecified without thyrotoxic crisis or storm: Secondary | ICD-10-CM | POA: Diagnosis not present

## 2016-06-14 MED ORDER — TRAMADOL HCL 50 MG PO TABS: 50.0000 mg | ORAL_TABLET | Freq: Three times a day (TID) | ORAL | 3 refills | Status: DC | PRN

## 2016-06-14 NOTE — Progress Notes (Signed)
Patient: Tara Scott Female    DOB: [birth year]   55 y.o.   MRN: [id number] Visit Date: 06/14/2016  Today's Provider: Mar Daring, PA-C   Chief Complaint  Patient presents with  . Hypothyroidism   Subjective:    HPI  Hyperthyroid, follow-up:  TSH  Date Value Ref Range Status  12/26/2015 0.016 (L) 0.450 - 4.500 uIU/mL Final   Wt Readings from Last 3 Encounters:  06/14/16 119 lb (54 kg)  03/08/16 115 lb (52.2 kg)  12/26/15 117 lb (53.1 kg)    She was last seen for hypothyroid 5 months ago.  Management since that visit includes check labs. She reports no medication.  She is not exercising. She is experiencing change in energy level, heat / cold intolerance, nervousness and palpitations She denies diarrhea and weight changes Weight trend: increasing steadily Patient is requesting to be referred back to Dr. Gabriel Carina. She has seen her in the past, last seeing her in 2015. In 2011 she was diagnosed with malignant hyperthermia and hyperthyroid. They wanted to remove her thyroid at that time. She had been in the operating room to have her thyroid removed and she coded thus they were unable to remove. She was then followed until 2015.  ------------------------------------------------------------------------  Patient reports low back pain and upper thigh pain that is worsening with increasing weakness. She feels this is secondary to her MD. She had imaging of her lumbar and thoracic spine in 2014 and 2015 but none recently.  Patient is requesting to be referred to neurology in [place] for [facility] clinic. Her twin sister goes there and she has heard good things.     Allergies  Allergen Reactions  . Anesthetics, Halogenated Other (See Comments)  . Ciprofloxacin     swelling  . Methimazole     hives  . Penicillins Other (See Comments)    RXN AS A CHILD     Current Outpatient Prescriptions:  .  ALPRAZolam (XANAX) 0.5 MG tablet, TAKE 1 TABLET BY MOUTH EVERY DAY AS  NEEDED, Disp: 30 tablet, Rfl: 5 .  citalopram (CELEXA) 40 MG tablet, TAKE 1 TABLET BY MOUTH EVERY DAY FOR DEPRESSION, Disp: 90 tablet, Rfl: 1  Review of Systems  Constitutional: Positive for appetite change.  HENT: Negative.   Respiratory: Negative.   Cardiovascular: Positive for palpitations. Negative for chest pain and leg swelling.  Gastrointestinal: Negative.   Endocrine: Positive for heat intolerance.  Genitourinary: Negative.   Musculoskeletal: Positive for back pain and myalgias. Negative for gait problem (stiffness when going from sitting to standing), joint swelling, neck pain and neck stiffness.  Skin: Negative.   Neurological: Negative.   Psychiatric/Behavioral: Negative.     Social History  Substance Use Topics  . Smoking status: Current Every Day Smoker    Packs/day: 1.00    Years: 30.00    Types: Cigarettes  . Smokeless tobacco: Never Used  . Alcohol use No   Objective:   BP 140/80 (BP Location: Left Arm, Patient Position: Sitting, Cuff Size: Normal)   Pulse 64   Temp 97.9 F (36.6 C)   Resp 16   Wt 119 lb (54 kg)   SpO2 97%   BMI 21.77 kg/m   Physical Exam  Constitutional: She appears well-developed and well-nourished. No distress.  Neck: Normal range of motion. Neck supple. No JVD present. No tracheal deviation present. No thyromegaly present.  Cardiovascular: Normal rate, regular rhythm and normal heart sounds.  Exam reveals no gallop  and no friction rub.   No murmur heard. Pulmonary/Chest: Effort normal and breath sounds normal. No respiratory distress. She has no wheezes. She has no rales.  Musculoskeletal:       Right hip: She exhibits decreased range of motion, decreased strength and tenderness. She exhibits no bony tenderness, no swelling, no crepitus, no deformity and no laceration.       Left hip: She exhibits decreased range of motion, decreased strength and tenderness. She exhibits no bony tenderness, no swelling, no crepitus, no deformity and no  laceration.       Lumbar back: She exhibits decreased range of motion. She exhibits no tenderness, no bony tenderness and no spasm.  When doing a leg lift with knee bent at 90 degree she could barely raise them 10 degress off the bed. Strength 3/5 with hip flexion  Lymphadenopathy:    She has no cervical adenopathy.  Skin: She is not diaphoretic.  Vitals reviewed.     Assessment & Plan:     1. Hyperthyroidism, subclinical Previous thyroid was 0.016 in April 2017. She never returned to have her labs rechecked. She reports having increasing heat intolerance and would like to get reestablished with Dr. Gabriel Carina. Referral has been placed as below. I did advise her that if it is going to take her greater than 1 month to get in with Dr. Gabriel Carina for her to call the office and we will order labs and neck ultrasound. - Ambulatory referral to Endocrinology  2. Goiter, nontoxic, multinodular See above medical treatment plan. - Ambulatory referral to Endocrinology  3. Adverse effect of malignant hyperthermia, sequela See above medical treatment plan. - Ambulatory referral to Endocrinology  4. MD (muscular dystrophy) [facility]) Will refer to the neurology office that she requested at [facility] for the muscular dystrophy clinic. I will give tramadol as below for pain. She has been using Tylenol with some relief. She is to call if pain worsens in the meantime and we will consider obtaining lumbar spine x-rays. - Ambulatory referral to Neurology - traMADol (ULTRAM) 50 MG tablet; Take 1 tablet (50 mg total) by mouth every 8 (eight) hours as needed.  Dispense: 90 tablet; Refill: 3  5. Bilateral low back pain without sciatica See above medical treatment plan for # 4. - traMADol (ULTRAM) 50 MG tablet; Take 1 tablet (50 mg total) by mouth every 8 (eight) hours as needed.  Dispense: 90 tablet; Refill: [place], PA-C  [facility]

## 2016-06-14 NOTE — Patient Instructions (Addendum)
Back Exercises The following exercises strengthen the muscles that help to support the back. They also help to keep the lower back flexible. Doing these exercises can help to prevent back pain or lessen existing pain. If you have back pain or discomfort, try doing these exercises 2-3 times each day or as told by your health care provider. When the pain goes away, do them once each day, but increase the number of times that you repeat the steps for each exercise (do more repetitions). If you do not have back pain or discomfort, do these exercises once each day or as told by your health care provider. EXERCISES Single Knee to Chest Repeat these steps 3-5 times for each leg: 1. Lie on your back on a firm bed or the floor with your legs extended. 2. Bring one knee to your chest. Your other leg should stay extended and in contact with the floor. 3. Hold your knee in place by grabbing your knee or thigh. 4. Pull on your knee until you feel a gentle stretch in your lower back. 5. Hold the stretch for 10-30 seconds. 6. Slowly release and straighten your leg. Pelvic Tilt Repeat these steps 5-10 times: 1. Lie on your back on a firm bed or the floor with your legs extended. 2. Bend your knees so they are pointing toward the ceiling and your feet are flat on the floor. 3. Tighten your lower abdominal muscles to press your lower back against the floor. This motion will tilt your pelvis so your tailbone points up toward the ceiling instead of pointing to your feet or the floor. 4. With gentle tension and even breathing, hold this position for 5-10 seconds. Cat-Cow Repeat these steps until your lower back becomes more flexible: 1. Get into a hands-and-knees position on a firm surface. Keep your hands under your shoulders, and keep your knees under your hips. You may place padding under your knees for comfort. 2. Let your head hang down, and point your tailbone toward the floor so your lower back becomes  rounded like the back of a cat. 3. Hold this position for 5 seconds. 4. Slowly lift your head and point your tailbone up toward the ceiling so your back forms a sagging arch like the back of a cow. 5. Hold this position for 5 seconds. Press-Ups Repeat these steps 5-10 times: 1. Lie on your abdomen (face-down) on the floor. 2. Place your palms near your head, about shoulder-width apart. 3. While you keep your back as relaxed as possible and keep your hips on the floor, slowly straighten your arms to raise the top half of your body and lift your shoulders. Do not use your back muscles to raise your upper torso. You may adjust the placement of your hands to make yourself more comfortable. 4. Hold this position for 5 seconds while you keep your back relaxed. 5. Slowly return to lying flat on the floor. Bridges Repeat these steps 10 times: 1. Lie on your back on a firm surface. 2. Bend your knees so they are pointing toward the ceiling and your feet are flat on the floor. 3. Tighten your buttocks muscles and lift your buttocks off of the floor until your waist is at almost the same height as your knees. You should feel the muscles working in your buttocks and the back of your thighs. If you do not feel these muscles, slide your feet 1-2 inches farther away from your buttocks. 4. Hold this position for 3-5   seconds. 5. Slowly lower your hips to the starting position, and allow your buttocks muscles to relax completely. If this exercise is too easy, try doing it with your arms crossed over your chest. Abdominal Crunches Repeat these steps 5-10 times: 1. Lie on your back on a firm bed or the floor with your legs extended. 2. Bend your knees so they are pointing toward the ceiling and your feet are flat on the floor. 3. Cross your arms over your chest. 4. Tip your chin slightly toward your chest without bending your neck. 5. Tighten your abdominal muscles and slowly raise your trunk (torso) high  enough to lift your shoulder blades a tiny bit off of the floor. Avoid raising your torso higher than that, because it can put too much stress on your low back and it does not help to strengthen your abdominal muscles. 6. Slowly return to your starting position. Back Lifts Repeat these steps 5-10 times: 1. Lie on your abdomen (face-down) with your arms at your sides, and rest your forehead on the floor. 2. Tighten the muscles in your legs and your buttocks. 3. Slowly lift your chest off of the floor while you keep your hips pressed to the floor. Keep the back of your head in line with the curve in your back. Your eyes should be looking at the floor. 4. Hold this position for 3-5 seconds. 5. Slowly return to your starting position. SEEK MEDICAL CARE IF:  Your back pain or discomfort gets much worse when you do an exercise.  Your back pain or discomfort does not lessen within 2 hours after you exercise. If you have any of these problems, stop doing these exercises right away. Do not do them again unless your health care provider says that you can. SEEK IMMEDIATE MEDICAL CARE IF:  You develop sudden, severe back pain. If this happens, stop doing the exercises right away. Do not do them again unless your health care provider says that you can.   This information is not intended to replace advice given to you by your health care provider. Make sure you discuss any questions you have with your health care provider.  Tramadol tablets What is this medicine? TRAMADOL (TRA ma dole) is a pain reliever. It is used to treat moderate to severe pain in adults. This medicine may be used for other purposes; ask your health care provider or pharmacist if you have questions. What should I tell my health care provider before I take this medicine? They need to know if you have any of these conditions: -brain tumor -depression -drug abuse or addiction -head injury -if you frequently drink alcohol containing  drinks -kidney disease or trouble passing urine -liver disease -lung disease, asthma, or breathing problems -seizures or epilepsy -suicidal thoughts, plans, or attempt; a previous suicide attempt by you or a family member -an unusual or allergic reaction to tramadol, codeine, other medicines, foods, dyes, or preservatives -pregnant or trying to get pregnant -breast-feeding How should I use this medicine? Take this medicine by mouth with a full glass of water. Follow the directions on the prescription label. If the medicine upsets your stomach, take it with food or milk. Do not take more medicine than you are told to take. Talk to your pediatrician regarding the use of this medicine in children. Special care may be needed. Overdosage: If you think you have taken too much of this medicine contact a poison control center or emergency room at once. NOTE: This medicine  is only for you. Do not share this medicine with others. What if I miss a dose? If you miss a dose, take it as soon as you can. If it is almost time for your next dose, take only that dose. Do not take double or extra doses. What may interact with this medicine? Do not take this medicine with any of the following medications: -MAOIs like Carbex, Eldepryl, Marplan, Nardil, and Parnate This medicine may also interact with the following medications: -alcohol or medicines that contain alcohol -antihistamines -benzodiazepines -bupropion -carbamazepine or oxcarbazepine -clozapine -cyclobenzaprine -digoxin -furazolidone -linezolid -medicines for depression, anxiety, or psychotic disturbances -medicines for migraine headache like almotriptan, eletriptan, frovatriptan, naratriptan, rizatriptan, sumatriptan, zolmitriptan -medicines for pain like pentazocine, buprenorphine, butorphanol, meperidine, nalbuphine, and propoxyphene -medicines for sleep -muscle relaxants -naltrexone -phenobarbital -phenothiazines like perphenazine,  thioridazine, chlorpromazine, mesoridazine, fluphenazine, prochlorperazine, promazine, and trifluoperazine -procarbazine -warfarin This list may not describe all possible interactions. Give your health care provider a list of all the medicines, herbs, non-prescription drugs, or dietary supplements you use. Also tell them if you smoke, drink alcohol, or use illegal drugs. Some items may interact with your medicine. What should I watch for while using this medicine? Tell your doctor or health care professional if your pain does not go away, if it gets worse, or if you have new or a different type of pain. You may develop tolerance to the medicine. Tolerance means that you will need a higher dose of the medicine for pain relief. Tolerance is normal and is expected if you take this medicine for a long time. Do not suddenly stop taking your medicine because you may develop a severe reaction. Your body becomes used to the medicine. This does NOT mean you are addicted. Addiction is a behavior related to getting and using a drug for a non-medical reason. If you have pain, you have a medical reason to take pain medicine. Your doctor will tell you how much medicine to take. If your doctor wants you to stop the medicine, the dose will be slowly lowered over time to avoid any side effects. You may get drowsy or dizzy. Do not drive, use machinery, or do anything that needs mental alertness until you know how this medicine affects you. Do not stand or sit up quickly, especially if you are an older patient. This reduces the risk of dizzy or fainting spells. Alcohol can increase or decrease the effects of this medicine. Avoid alcoholic drinks. You may have constipation. Try to have a bowel movement at least every 2 to 3 days. If you do not have a bowel movement for 3 days, call your doctor or health care professional. Your mouth may get dry. Chewing sugarless gum or sucking hard candy, and drinking plenty of water may  help. Contact your doctor if the problem does not go away or is severe. What side effects may I notice from receiving this medicine? Side effects that you should report to your doctor or health care professional as soon as possible: -allergic reactions like skin rash, itching or hives, swelling of the face, lips, or tongue -breathing difficulties, wheezing -confusion -itching -light headedness or fainting spells -redness, blistering, peeling or loosening of the skin, including inside the mouth -seizures Side effects that usually do not require medical attention (report to your doctor or health care professional if they continue or are bothersome): -constipation -dizziness -drowsiness -headache -nausea, vomiting This list may not describe all possible side effects. Call your doctor for medical advice about  side effects. You may report side effects to FDA at 1-800-FDA-1088. Where should I keep my medicine? Keep out of the reach of children. This medicine may cause accidental overdose and death if it taken by other adults, children, or pets. Mix any unused medicine with a substance like cat litter or coffee grounds. Then throw the medicine away in a sealed container like a sealed bag or a coffee can with a lid. Do not use the medicine after the expiration date. Store at room temperature between 15 and 30 degrees C (59 and 86 degrees F). NOTE: This sheet is a summary. It may not cover all possible information. If you have questions about this medicine, talk to your doctor, pharmacist, or health care provider.    2016, Elsevier/Gold Standard. (2013-11-02 15:42:09)    Document Released: 10/14/2004 Document Revised: 05/28/2015 Document Reviewed: 10/31/2014 Elsevier Interactive Patient Education [employer].

## 2016-07-22 ENCOUNTER — Ambulatory Visit: Payer: PPO | Admitting: Physician Assistant

## 2016-07-23 ENCOUNTER — Telehealth: Payer: Self-pay

## 2016-07-23 ENCOUNTER — Ambulatory Visit
Admission: RE | Admit: 2016-07-23 | Discharge: 2016-07-23 | Disposition: A | Discharge status: Home / Self Care | Payer: PPO | Source: Ambulatory Visit | Attending: Physician Assistant | Admitting: Physician Assistant

## 2016-07-23 ENCOUNTER — Ambulatory Visit (INDEPENDENT_AMBULATORY_CARE_PROVIDER_SITE_OTHER): Payer: PPO | Admitting: Physician Assistant

## 2016-07-23 VITALS — BP 118/60 | HR 72 | Temp 98.5°F | Resp 14 | Wt 118.0 lb

## 2016-07-23 DIAGNOSIS — G7129 Other congenital myopathy: Secondary | ICD-10-CM

## 2016-07-23 DIAGNOSIS — M545 Low back pain: Secondary | ICD-10-CM

## 2016-07-23 DIAGNOSIS — G712 Congenital myopathies: Secondary | ICD-10-CM | POA: Diagnosis not present

## 2016-07-23 DIAGNOSIS — G8929 Other chronic pain: Secondary | ICD-10-CM

## 2016-07-23 DIAGNOSIS — Z23 Encounter for immunization: Secondary | ICD-10-CM | POA: Diagnosis not present

## 2016-07-23 DIAGNOSIS — M48061 Spinal stenosis, lumbar region without neurogenic claudication: Secondary | ICD-10-CM | POA: Insufficient documentation

## 2016-07-23 DIAGNOSIS — R1032 Left lower quadrant pain: Secondary | ICD-10-CM

## 2016-07-23 DIAGNOSIS — M5136 Other intervertebral disc degeneration, lumbar region: Secondary | ICD-10-CM | POA: Insufficient documentation

## 2016-07-23 DIAGNOSIS — E042 Nontoxic multinodular goiter: Secondary | ICD-10-CM | POA: Diagnosis not present

## 2016-07-23 DIAGNOSIS — E0590 Thyrotoxicosis, unspecified without thyrotoxic crisis or storm: Secondary | ICD-10-CM | POA: Diagnosis not present

## 2016-07-23 NOTE — Telephone Encounter (Signed)
[id number] 

## 2016-07-23 NOTE — Telephone Encounter (Signed)
-----   Message from Trinna Post, [place] sent at 07/23/2016  3:47 PM EDT ----- Lumbar spine xray came back revealing some constipation and disc space narrowing, but no acute bony abnormalities or severe degenerative disc disease. Patient should continue with OTC pain relief or if that is not helping she may try tramadol.

## 2016-07-23 NOTE — Progress Notes (Signed)
Patient: Tara Scott Female    DOB: Jan 10, 1961   55 y.o.   MRN: [id number] Visit Date: 07/23/2016  Today's Provider: Trinna Post, PA-C   Chief Complaint  Patient presents with  . Back Pain   Subjective:    HPI  Patient is a 55 y/o female here with history of central core myopathy seen in the past by [facility] neurology, history of bladder tumor removal, and chronic back pain ongoing for 15 years who is here to discuss back pain she is having. She reports a flare starting two 2 days ago with. Pain located in the lower back both sides, she is having tingling sensation in the left leg, some weakness, no radiation to her feet. She has muscular dystrophy and is not sure if some of this coming from this issue also. She denies bowel or bladder incontinence. She denies active cancer or IVDU. She has tried nothing for the pain. She did not take Tramadol that Fenton Malling, [place] gave her in September, she was nervous to take it and if she has a reaction. 3/10 today and at it's worst "off the chart." Patient has neurology appointment with [facility] on 09/21/2016. Patient refuses physical therapy.   She has also noticed pain in the LLQ of the abdomen/groin area ongoing for three weeks. She still has her uterus and ovaries. She denies vaginal bleeding. She denies nausea, diarrhea, vomiting. She denies melena or hematochezia. She denies dysuria, frequency, hematuria. She denies any trauma to the area. She does report that in the remote past she had a bladder tumor removed, and the doctor told her this was a "muscle tumor" and she wasn't sure if it was supposed to come back.  Additionally, patient was followed by Dr. Gabriel Carina in endocrinology for a multinodular goiter in 2015. She was lost to follow up due to illness and the death of her husband. Before that, she was unable to tolerate methimazole and PTU. Thyroid surgery removal was attempted but patient experienced malignant hyperthermia in the Or, and thus  attempt was abandoned. Per Dr. Joycie Peek last night, patient was supposed to follow up in one year to get Tsh, T4, T3 and thyroid ultrasound. Patient has not done this.     Allergies  Allergen Reactions  . Anesthetics, Halogenated Other (See Comments)  . Ciprofloxacin     swelling  . Methimazole     hives  . Penicillins Other (See Comments)    RXN AS A CHILD     Current Outpatient Prescriptions:  .  ALPRAZolam (XANAX) 0.5 MG tablet, TAKE 1 TABLET BY MOUTH EVERY DAY AS NEEDED, Disp: 30 tablet, Rfl: 5 .  citalopram (CELEXA) 40 MG tablet, TAKE 1 TABLET BY MOUTH EVERY DAY FOR DEPRESSION, Disp: 90 tablet, Rfl: 1 .  traMADol (ULTRAM) 50 MG tablet, Take 1 tablet (50 mg total) by mouth every 8 (eight) hours as needed. (Patient not taking: Reported on 07/23/2016), Disp: 90 tablet, Rfl: 3  Review of Systems  Respiratory: Negative.   Cardiovascular: Negative.   Musculoskeletal: Positive for arthralgias, back pain, gait problem, myalgias and neck stiffness.  Neurological: Positive for tremors (in her neck), weakness and numbness.    Social History  Substance Use Topics  . Smoking status: Current Every Day Smoker    Packs/day: 1.00    Years: 30.00    Types: Cigarettes  . Smokeless tobacco: Never Used  . Alcohol use No   Objective:   BP 118/60   Pulse  72   Temp 98.5 F (36.9 C)   Resp 14   Wt 118 lb (53.5 kg)   BMI 21.58 kg/m   Physical Exam  Constitutional: She is oriented to person, place, and time. She appears well-developed and well-nourished. No distress.  Eyes: Conjunctivae are normal. Right eye exhibits no discharge. Left eye exhibits no discharge.  Neck: Thyroid mass present.  Hard nodule palpated on left aspect of thyroid.   Cardiovascular: Normal rate and regular rhythm.   Pulmonary/Chest: Effort normal and breath sounds normal.  Abdominal: Soft. Bowel sounds are normal. She exhibits no distension and no mass. There is no tenderness. There is no rebound and no guarding.    Musculoskeletal:  Patient has marked difficulty standing up from seated position.   Neurological: She is alert and oriented to person, place, and time. She displays no tremor. No cranial nerve deficit or sensory deficit. Coordination normal.  4/5 strength in bilateral lower extremities: hip flexors, wuads, dorsiflexion, plantarflexion. There is weakness noted in her bilateral lower extremities. Able to move against resistance but less than normal. Patient needs to use side rails to stand up from seated position.  Skin: Skin is warm and dry. She is not diaphoretic.  Psychiatric: She has a normal mood and affect. Her behavior is normal.        Assessment & Plan:      Problem List Items Addressed This Visit      Endocrine   Goiter, nontoxic, multinodular - Primary   Relevant Orders   TSH+T4F+T3Free   US THYROID   Hyperthyroidism, subclinical     Musculoskeletal and Integument   Central core disease ([place])    Other Visit Diagnoses    Chronic low back pain, unspecified back pain laterality, with sciatica presence unspecified       Relevant Orders   DG Lumbar Spine Complete (Completed)   LLQ pain       Relevant Orders   US Pelvis Complete   US Transvaginal Non-OB   Need for influenza vaccination       Relevant Orders   Flu Vaccine QUAD 36+ mos PF IM (Fluarix & Fluzone Quad PF) (Completed)      Chronic Back Pain  Chronic ongoing for 15 years. Patient has not tried any sort of pain relief. L-spine xray showed mild degenerative disc disease. Advised patient to try Pt, patient refuses. Advised patient to try OTC medication, states she is scared of reactions. Advised patient to try OTC medications and tramadol if these don't work. Patient will see neurology in January 2018.   Goiter   History of multinodular goiter, lost to follow up with Dr. Gabriel Carina. Has been re-referred. Will get labs that Dr. Gabriel Carina wanted at follow up visit.   LLQ Pain  Counseled patient on benign causes of  abdominal pain, such as constipation, muscle strain, etc. Patient extremely concerned about recurrence of prior bladder tumor. Will get ultrasound to investigate.  I have spent 25 minutes with this patient, >50% of which was spent on counseling and coordination of care.  Return if symptoms worsen or fail to improve.   Patient Instructions  Back Pain, Adult Back pain is very common in adults.The cause of back pain is rarely dangerous and the pain often gets better over time.The cause of your back pain may not be known. Some common causes of back pain include:  Strain of the muscles or ligaments supporting the spine.  Wear and tear (degeneration) of the spinal disks.  Arthritis.  Direct injury to the back. For many people, back pain may return. Since back pain is rarely dangerous, most people can learn to manage this condition on their own. HOME CARE INSTRUCTIONS Watch your back pain for any changes. The following actions may help to lessen any discomfort you are feeling:  Remain active. It is stressful on your back to sit or stand in one place for long periods of time. Do not sit, drive, or stand in one place for more than 30 minutes at a time. Take short walks on even surfaces as soon as you are able.Try to increase the length of time you walk each day.  Exercise regularly as directed by your health care provider. Exercise helps your back heal faster. It also helps avoid future injury by keeping your muscles strong and flexible.  Do not stay in bed.Resting more than 1-2 days can delay your recovery.  Pay attention to your body when you bend and lift. The most comfortable positions are those that put less stress on your recovering back. Always use proper lifting techniques, including:  Bending your knees.  Keeping the load close to your body.  Avoiding twisting.  Find a comfortable position to sleep. Use a firm mattress and lie on your side with your knees slightly bent. If you  lie on your back, put a pillow under your knees.  Avoid feeling anxious or stressed.Stress increases muscle tension and can worsen back pain.It is important to recognize when you are anxious or stressed and learn ways to manage it, such as with exercise.  Take medicines only as directed by your health care provider. Over-the-counter medicines to reduce pain and inflammation are often the most helpful.Your health care provider may prescribe muscle relaxant drugs.These medicines help dull your pain so you can more quickly return to your normal activities and healthy exercise.  Apply ice to the injured area:  Put ice in a plastic bag.  Place a towel between your skin and the bag.  Leave the ice on for 20 minutes, 2-3 times a day for the first 2-3 days. After that, ice and heat may be alternated to reduce pain and spasms.  Maintain a healthy weight. Excess weight puts extra stress on your back and makes it difficult to maintain good posture. SEEK MEDICAL CARE IF:  You have pain that is not relieved with rest or medicine.  You have increasing pain going down into the legs or buttocks.  You have pain that does not improve in one week.  You have night pain.  You lose weight.  You have a fever or chills. SEEK IMMEDIATE MEDICAL CARE IF:   You develop new bowel or bladder control problems.  You have unusual weakness or numbness in your arms or legs.  You develop nausea or vomiting.  You develop abdominal pain.  You feel faint.   This information is not intended to replace advice given to you by your health care provider. Make sure you discuss any questions you have with your health care provider.   Document Released: 09/06/2005 Document Revised: 09/27/2014 Document Reviewed: 01/08/2014 Elsevier Interactive Patient Education [employer].   The entirety of the information documented in the History of Present Illness, Review of Systems and Physical Exam were personally  obtained by me. Portions of this information were initially documented by [facility] and reviewed by me for thoroughness and accuracy.            Trinna Post, PA-C  [facility]  [place]

## 2016-07-23 NOTE — Patient Instructions (Signed)

## 2016-07-24 LAB — TSH+T4F+T3FREE
Free T4: 1.4 ng/dL (ref 0.82–1.77)
T3, Free: 5.1 pg/mL — ABNORMAL HIGH (ref 2.0–4.4)
TSH: <0.006 u[IU]/mL — ABNORMAL LOW (ref 0.450–4.500)

## 2016-07-26 ENCOUNTER — Telehealth: Payer: Self-pay

## 2016-07-26 NOTE — Telephone Encounter (Signed)
-----   Message from Trinna Post, [place] sent at 07/26/2016  8:14 AM EST ----- TSH lower than last lab draw. T3 high, T4 normal. Patient has multinodular goiter and was lost to follow up with Dr. Gabriel Carina. Was recently re-referred. Please encourage patient to schedule her appointment with endocrinology to discuss treatment options.

## 2016-07-26 NOTE — Telephone Encounter (Signed)
[id number] 

## 2016-07-26 NOTE — Telephone Encounter (Signed)
Patient has been advised. KW 

## 2016-07-29 ENCOUNTER — Ambulatory Visit: Payer: PPO

## 2016-10-09 ENCOUNTER — Other Ambulatory Visit: Payer: Self-pay | Admitting: Family Medicine

## 2016-10-09 DIAGNOSIS — F419 Anxiety disorder, unspecified: Secondary | ICD-10-CM

## 2016-10-26 DIAGNOSIS — F419 Anxiety disorder, unspecified: Secondary | ICD-10-CM | POA: Diagnosis not present

## 2016-10-26 DIAGNOSIS — G712 Congenital myopathies: Secondary | ICD-10-CM | POA: Diagnosis not present

## 2016-10-26 DIAGNOSIS — E0590 Thyrotoxicosis, unspecified without thyrotoxic crisis or storm: Secondary | ICD-10-CM | POA: Diagnosis not present

## 2017-01-14 ENCOUNTER — Other Ambulatory Visit: Payer: Self-pay | Admitting: Physician Assistant

## 2017-01-14 DIAGNOSIS — F419 Anxiety disorder, unspecified: Secondary | ICD-10-CM

## 2017-01-14 NOTE — Telephone Encounter (Signed)
rx called in-aa 

## 2017-01-19 DIAGNOSIS — A499 Bacterial infection, unspecified: Secondary | ICD-10-CM | POA: Diagnosis not present

## 2017-01-19 DIAGNOSIS — L509 Urticaria, unspecified: Secondary | ICD-10-CM | POA: Diagnosis not present

## 2017-01-19 DIAGNOSIS — N390 Urinary tract infection, site not specified: Secondary | ICD-10-CM | POA: Diagnosis not present

## 2017-02-02 ENCOUNTER — Ambulatory Visit (INDEPENDENT_AMBULATORY_CARE_PROVIDER_SITE_OTHER): Payer: PPO

## 2017-02-02 VITALS — BP 122/60 | HR 72 | Temp 98.7°F | Ht 62.0 in | Wt 123.4 lb

## 2017-02-02 DIAGNOSIS — Z1159 Encounter for screening for other viral diseases: Secondary | ICD-10-CM | POA: Diagnosis not present

## 2017-02-02 DIAGNOSIS — Z114 Encounter for screening for human immunodeficiency virus [HIV]: Secondary | ICD-10-CM

## 2017-02-02 DIAGNOSIS — Z0000 Encounter for general adult medical examination without abnormal findings: Secondary | ICD-10-CM

## 2017-02-02 NOTE — Patient Instructions (Signed)
Tara Scott , Thank you for taking time to come for your Medicare Wellness Visit. I appreciate your ongoing commitment to your health goals. Please review the following plan we discussed and let me know if I can assist you in the future.   Screening recommendations/referrals: Colonoscopy: completed 03/11/16, due 02/2026 Mammogram: declined, would like to speak with PCP about this 02/03/17 Bone Density: N/A Recommended yearly ophthalmology/optometry visit for glaucoma screening and checkup Recommended yearly dental visit for hygiene and checkup  Vaccinations: Influenza vaccine: up to date, due 05/2017 Pneumococcal vaccine: N/A Tdap vaccine: declined Shingles vaccine: N/A  Advanced directives: Advance directive discussed with you today. I have provided a copy for you to complete at home and have notarized. Once this is complete please bring a copy in to our office so we can scan it into your chart.  Conditions/risks identified: Fall risk; Recommend increasing water intake to 4 glasses a day.  Next appointment: None, need to schedule follow up with PCP and 1 year AWV.  Preventive Care 40-64 Years, Female Preventive care refers to lifestyle choices and visits with your health care provider that can promote health and wellness. What does preventive care include?  A yearly physical exam. This is also called an annual well check.  Dental exams once or twice a year.  Routine eye exams. Ask your health care provider how often you should have your eyes checked.  Personal lifestyle choices, including:  Daily care of your teeth and gums.  Regular physical activity.  Eating a healthy diet.  Avoiding tobacco and drug use.  Limiting alcohol use.  Practicing safe sex.  Taking low-dose aspirin daily starting at age 17.  Taking vitamin and mineral supplements as recommended by your health care provider. What happens during an annual well check? The services and screenings done by your  health care provider during your annual well check will depend on your age, overall health, lifestyle risk factors, and family history of disease. Counseling  Your health care provider may ask you questions about your:  Alcohol use.  Tobacco use.  Drug use.  Emotional well-being.  Home and relationship well-being.  Sexual activity.  Eating habits.  Work and work Statistician.  Method of birth control.  Menstrual cycle.  Pregnancy history. Screening  You may have the following tests or measurements:  Height, weight, and BMI.  Blood pressure.  Lipid and cholesterol levels. These may be checked every 5 years, or more frequently if you are over 64 years old.  Skin check.  Lung cancer screening. You may have this screening every year starting at age 85 if you have a 30-pack-year history of smoking and currently smoke or have quit within the past 15 years.  Fecal occult blood test (FOBT) of the stool. You may have this test every year starting at age 74.  Flexible sigmoidoscopy or colonoscopy. You may have a sigmoidoscopy every 5 years or a colonoscopy every 10 years starting at age 10.  Hepatitis C blood test.  Hepatitis B blood test.  Sexually transmitted disease (STD) testing.  Diabetes screening. This is done by checking your blood sugar (glucose) after you have not eaten for a while (fasting). You may have this done every 1-3 years.  Mammogram. This may be done every 1-2 years. Talk to your health care provider about when you should start having regular mammograms. This may depend on whether you have a family history of breast cancer.  BRCA-related cancer screening. This may be done if you have  a family history of breast, ovarian, tubal, or peritoneal cancers.  Pelvic exam and Pap test. This may be done every 3 years starting at age 16. Starting at age 39, this may be done every 5 years if you have a Pap test in combination with an HPV test.  Bone density scan.  This is done to screen for osteoporosis. You may have this scan if you are at high risk for osteoporosis. Discuss your test results, treatment options, and if necessary, the need for more tests with your health care provider. Vaccines  Your health care provider may recommend certain vaccines, such as:  Influenza vaccine. This is recommended every year.  Tetanus, diphtheria, and acellular pertussis (Tdap, Td) vaccine. You may need a Td booster every 10 years.  Zoster vaccine. You may need this after age 68.  Pneumococcal 13-valent conjugate (PCV13) vaccine. You may need this if you have certain conditions and were not previously vaccinated.  Pneumococcal polysaccharide (PPSV23) vaccine. You may need one or two doses if you smoke cigarettes or if you have certain conditions. Talk to your health care provider about which screenings and vaccines you need and how often you need them. This information is not intended to replace advice given to you by your health care provider. Make sure you discuss any questions you have with your health care provider. Document Released: 10/03/2015 Document Revised: 05/26/2016 Document Reviewed: 07/08/2015 Elsevier Interactive Patient Education  2017 [place] Prevention in the Home Falls can cause injuries. They can happen to people of all ages. There are many things you can do to make your home safe and to help prevent falls. What can I do on the outside of my home?  Regularly fix the edges of walkways and driveways and fix any cracks.  Remove anything that might make you trip as you walk through a door, such as a raised step or threshold.  Trim any bushes or trees on the path to your home.  Use bright outdoor lighting.  Clear any walking paths of anything that might make someone trip, such as rocks or tools.  Regularly check to see if handrails are loose or broken. Make sure that both sides of any steps have handrails.  Any raised decks  and porches should have guardrails on the edges.  Have any leaves, snow, or ice cleared regularly.  Use sand or salt on walking paths during winter.  Clean up any spills in your garage right away. This includes oil or grease spills. What can I do in the bathroom?  Use night lights.  Install grab bars by the toilet and in the tub and shower. Do not use towel bars as grab bars.  Use non-skid mats or decals in the tub or shower.  If you need to sit down in the shower, use a plastic, non-slip stool.  Keep the floor dry. Clean up any water that spills on the floor as soon as it happens.  Remove soap buildup in the tub or shower regularly.  Attach bath mats securely with double-sided non-slip rug tape.  Do not have throw rugs and other things on the floor that can make you trip. What can I do in the bedroom?  Use night lights.  Make sure that you have a light by your bed that is easy to reach.  Do not use any sheets or blankets that are too big for your bed. They should not hang down onto the floor.  Have a  firm chair that has side arms. You can use this for support while you get dressed.  Do not have throw rugs and other things on the floor that can make you trip. What can I do in the kitchen?  Clean up any spills right away.  Avoid walking on wet floors.  Keep items that you use a lot in easy-to-reach places.  If you need to reach something above you, use a strong step stool that has a grab bar.  Keep electrical cords out of the way.  Do not use floor polish or wax that makes floors slippery. If you must use wax, use non-skid floor wax.  Do not have throw rugs and other things on the floor that can make you trip. What can I do with my stairs?  Do not leave any items on the stairs.  Make sure that there are handrails on both sides of the stairs and use them. Fix handrails that are broken or loose. Make sure that handrails are as long as the stairways.  Check any  carpeting to make sure that it is firmly attached to the stairs. Fix any carpet that is loose or worn.  Avoid having throw rugs at the top or bottom of the stairs. If you do have throw rugs, attach them to the floor with carpet tape.  Make sure that you have a light switch at the top of the stairs and the bottom of the stairs. If you do not have them, ask someone to add them for you. What else can I do to help prevent falls?  Wear shoes that:  Do not have high heels.  Have rubber bottoms.  Are comfortable and fit you well.  Are closed at the toe. Do not wear sandals.  If you use a stepladder:  Make sure that it is fully opened. Do not climb a closed stepladder.  Make sure that both sides of the stepladder are locked into place.  Ask someone to hold it for you, if possible.  Clearly mark and make sure that you can see:  Any grab bars or handrails.  First and last steps.  Where the edge of each step is.  Use tools that help you move around (mobility aids) if they are needed. These include:  Canes.  Walkers.  Scooters.  Crutches.  Turn on the lights when you go into a dark area. Replace any light bulbs as soon as they burn out.  Set up your furniture so you have a clear path. Avoid moving your furniture around.  If any of your floors are uneven, fix them.  If there are any pets around you, be aware of where they are.  Review your medicines with your doctor. Some medicines can make you feel dizzy. This can increase your chance of falling. Ask your doctor what other things that you can do to help prevent falls. This information is not intended to replace advice given to you by your health care provider. Make sure you discuss any questions you have with your health care provider. Document Released: 07/03/2009 Document Revised: 02/12/2016 Document Reviewed: 10/11/2014 Elsevier Interactive Patient Education  2017 [employer].

## 2017-02-02 NOTE — Progress Notes (Signed)
Subjective:   Tara Scott is a 56 y.o. female who presents for Medicare Annual (Subsequent) preventive examination.  Review of Systems:  N/A  Cardiac Risk Factors include: advanced age (>53men, >82 women);smoking/ tobacco exposure     Objective:     Vitals: BP 122/60 (BP Location: Right Arm)   Pulse 72   Temp 98.7 F (37.1 C) (Oral)   Ht 5\' 2"  (1.575 m)   Wt 123 lb 6.4 oz (56 kg)   BMI 22.57 kg/m   Body mass index is 22.57 kg/m.   Tobacco History  Smoking Status  . Current Every Day Smoker  . Packs/day: 1.00  . Years: 30.00  . Types: Cigarettes  Smokeless Tobacco  . Never Used     Ready to quit: Yes Counseling given: No   Past Medical History:  Diagnosis Date  . Anxiety   . 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 DVT (deep venous thrombosis) ([place])   . Malignant hyperthermia   . Toxic multinodular goiter    Past Surgical History:  Procedure Laterality Date  . ADENOIDECTOMY    . CESAREAN SECTION    . COLONOSCOPY WITH PROPOFOL N/A 03/08/2016   Procedure: COLONOSCOPY WITH PROPOFOL;  Surgeon: Manya Silvas, MD;  Location: [facility] ENDOSCOPY;  Service: Endoscopy;  Laterality: N/A;  . LAPAROTOMY  1984   OVARIAN CYST   Family History  Problem Relation Age of Onset  . CVA Mother   . Hypercholesterolemia Mother   . Depression Mother   . Rheum arthritis Mother   . Deep vein thrombosis Mother   . Bladder Cancer Father   . Diverticulitis Father   . Pulmonary embolism Father   . Lung cancer Father   . Deep vein thrombosis Sister   . Colon polyps Sister   . Atrial fibrillation Sister   . Muscular dystrophy Sister   . Diabetes Son        type 2   History  Sexual Activity  . Sexual activity: Not on file    Outpatient Encounter Prescriptions as of 02/02/2017  Medication Sig  . ALPRAZolam (XANAX) 0.5 MG tablet TAKE 1 TABLET BY MOUTH EVERY DAY AS NEEDED  . citalopram (CELEXA) 40 MG tablet TAKE 1 TABLET BY  MOUTH EVERY DAY FOR DEPRESSION  . traMADol (ULTRAM) 50 MG tablet Take 1 tablet (50 mg total) by mouth every 8 (eight) hours as needed. (Patient not taking: Reported on 07/23/2016)   No facility-administered encounter medications on file as of 02/02/2017.     Activities of Daily Living In your present state of health, do you have any difficulty performing the following activities: 02/02/2017  Hearing? Y  Vision? Y  Difficulty concentrating or making decisions? N  Walking or climbing stairs? Y  Dressing or bathing? N  Doing errands, shopping? Y  Preparing Food and eating ? N  Using the Toilet? N  In the past six months, have you accidently leaked urine? N  Managing your Medications? N  Managing your Finances? N  Housekeeping or managing your Housekeeping? N  Some recent data might be hidden    Patient Care Team: Rubye Beach as PCP - General (Family Medicine) Agapito Games as Consulting Physician (Optometry)    Assessment:     Exercise Activities and Dietary recommendations Current Exercise Habits: The patient does not participate in regular exercise at present, Exercise limited by: None identified  Goals    . Increase  water intake          Recommend increasing water intake to 4 glasses a day.      Fall Risk Fall Risk  02/02/2017 12/26/2015  Falls in the past year? Yes No  Number falls in past yr: 2 or more -  Injury with Fall? No -  Risk for fall due to : Other (Comment) -  Risk for fall due to (comments): muscular dystrophy -   Depression Screen PHQ 2/9 Scores 02/02/2017 12/26/2015  PHQ - 2 Score 6 4  PHQ- 9 Score 14 13     Cognitive Function     6CIT Screen 02/02/2017  What Year? 0 points  What month? 0 points  What time? 0 points  Count back from 20 0 points  Months in reverse 0 points  Repeat phrase 0 points  Total Score 0    Immunization History  Administered Date(s) Administered  . Influenza,inj,Quad PF,36+ Mos 07/23/2016  .  Pneumococcal Polysaccharide-23 07/27/2011   Screening Tests Health Maintenance  Topic Date Due  . MAMMOGRAM  09/11/2010  . PAP SMEAR  09/07/2015  . TETANUS/TDAP  09/20/2026 (Originally 09/12/1979)  . INFLUENZA VACCINE  04/20/2017  . COLONOSCOPY  03/11/2021  . Hepatitis C Screening  Completed  . HIV Screening  Completed      Plan:  I have personally reviewed and addressed the Medicare Annual Wellness questionnaire and have noted the following in the patient's chart:  A. Medical and social history B. Use of alcohol, tobacco or illicit drugs  C. Current medications and supplements D. Functional ability and status E.  Nutritional status F.  Physical activity G. Advance directives H. List of other physicians I.  Hospitalizations, surgeries, and ER visits in previous 12 months J.  [place] such as hearing and vision if needed, cognitive and depression L. Referrals and appointments - none  In addition, I have reviewed and discussed with patient certain preventive protocols, quality metrics, and best practice recommendations. A written personalized care plan for preventive services as well as general preventive health recommendations were provided to patient.  See attached scanned questionnaire for additional information.   Signed,  Fabio Neighbors, LPN Nurse Health Advisor   MD Recommendations: Pt declined tetanus vaccine today. Pt would like to speak with PCP tomorrow at [facility] about mammogram and pap smear.   I have reviewed the documentation and information obtained by Fabio Neighbors, LPN in the above chart and agree as above. I was available for consultation if any questions or issues arose.  Fenton Malling, PA-C

## 2017-02-03 ENCOUNTER — Ambulatory Visit: Payer: PPO | Admitting: Physician Assistant

## 2017-02-08 ENCOUNTER — Ambulatory Visit (INDEPENDENT_AMBULATORY_CARE_PROVIDER_SITE_OTHER): Payer: PPO | Admitting: Family Medicine

## 2017-02-08 ENCOUNTER — Encounter: Payer: Self-pay | Admitting: Family Medicine

## 2017-02-08 VITALS — BP 108/70 | HR 62 | Temp 98.3°F | Resp 16 | Wt 123.2 lb

## 2017-02-08 DIAGNOSIS — E042 Nontoxic multinodular goiter: Secondary | ICD-10-CM | POA: Diagnosis not present

## 2017-02-08 DIAGNOSIS — L509 Urticaria, unspecified: Secondary | ICD-10-CM

## 2017-02-08 NOTE — Progress Notes (Signed)
Subjective:     Patient ID: Tara Scott, female   DOB: 1961-06-29, 56 y.o.   MRN: [id number]  HPI  Chief Complaint  Patient presents with  . Urticaria    Patient comes in office today with concerns of hives on her body for the past 4 weeks. Patient states that she was previously seen and treated for hived at [facility] in. Patient does admit to being under emotional stress with the passing of her mother in Jan. and husband passing away. Patient reports that hives are on her arms, legs and trunk, patient stated that she does have a history of overactive thyroid and had read that it can cause hives as well.   Reports she took prednisone and hydroxyzine without much improvement. Remains compliant with citalopram and Xanax (one daily). States she thinks about her husband daily who died of liver failure last year. States she currently has resolving cold sx with clear sinus drainage.   Review of Systems     Objective:   Physical Exam  Constitutional: She appears well-developed and well-nourished. No distress.  Pulmonary/Chest: Breath sounds normal. She has no wheezes.  Skin:  A few scattered papules on her upper extremities.       Assessment:    1. Urticaria of unknown origin - Ambulatory referral to Allergy  2. Goiter, nontoxic, multinodular - Ambulatory referral to Endocrinology    Plan:    Start loratadine daily.

## 2017-02-08 NOTE — Patient Instructions (Signed)
Try Claritin daily. We will call you about the referrals.

## 2017-03-11 DIAGNOSIS — E0590 Thyrotoxicosis, unspecified without thyrotoxic crisis or storm: Secondary | ICD-10-CM | POA: Diagnosis not present

## 2017-03-28 ENCOUNTER — Other Ambulatory Visit: Payer: Self-pay | Admitting: Internal Medicine

## 2017-03-28 DIAGNOSIS — E0520 Thyrotoxicosis with toxic multinodular goiter without thyrotoxic crisis or storm: Secondary | ICD-10-CM

## 2017-03-28 DIAGNOSIS — E0590 Thyrotoxicosis, unspecified without thyrotoxic crisis or storm: Secondary | ICD-10-CM

## 2017-04-13 ENCOUNTER — Other Ambulatory Visit: Payer: Self-pay | Admitting: Physician Assistant

## 2017-04-13 DIAGNOSIS — F419 Anxiety disorder, unspecified: Secondary | ICD-10-CM

## 2017-04-28 DIAGNOSIS — R300 Dysuria: Secondary | ICD-10-CM | POA: Diagnosis not present

## 2017-04-28 DIAGNOSIS — N390 Urinary tract infection, site not specified: Secondary | ICD-10-CM | POA: Diagnosis not present

## 2017-06-21 ENCOUNTER — Encounter: Payer: Self-pay | Admitting: Physician Assistant

## 2017-06-21 ENCOUNTER — Telehealth: Payer: Self-pay

## 2017-06-21 NOTE — Telephone Encounter (Signed)
Patient contacted to schedule CPE Appt 07/15/17 @2pm 

## 2017-07-15 ENCOUNTER — Encounter: Payer: Self-pay | Admitting: Physician Assistant

## 2017-08-08 ENCOUNTER — Other Ambulatory Visit: Payer: Self-pay | Admitting: Physician Assistant

## 2017-08-08 DIAGNOSIS — F419 Anxiety disorder, unspecified: Secondary | ICD-10-CM

## 2017-08-08 NOTE — Telephone Encounter (Signed)
Pharmacy requesting refills. Thanks!  

## 2017-10-05 ENCOUNTER — Ambulatory Visit (INDEPENDENT_AMBULATORY_CARE_PROVIDER_SITE_OTHER): Payer: PPO | Admitting: Physician Assistant

## 2017-10-05 ENCOUNTER — Encounter: Payer: Self-pay | Admitting: Physician Assistant

## 2017-10-05 VITALS — BP 128/72 | HR 72 | Temp 98.2°F | Ht 62.0 in | Wt 129.6 lb

## 2017-10-05 DIAGNOSIS — F341 Dysthymic disorder: Secondary | ICD-10-CM | POA: Diagnosis not present

## 2017-10-05 DIAGNOSIS — Z01411 Encounter for gynecological examination (general) (routine) with abnormal findings: Secondary | ICD-10-CM | POA: Diagnosis not present

## 2017-10-05 DIAGNOSIS — Z23 Encounter for immunization: Secondary | ICD-10-CM | POA: Diagnosis not present

## 2017-10-05 DIAGNOSIS — Z124 Encounter for screening for malignant neoplasm of cervix: Secondary | ICD-10-CM

## 2017-10-05 DIAGNOSIS — E0590 Thyrotoxicosis, unspecified without thyrotoxic crisis or storm: Secondary | ICD-10-CM | POA: Diagnosis not present

## 2017-10-05 DIAGNOSIS — R102 Pelvic and perineal pain: Secondary | ICD-10-CM | POA: Diagnosis not present

## 2017-10-05 DIAGNOSIS — Z0000 Encounter for general adult medical examination without abnormal findings: Secondary | ICD-10-CM

## 2017-10-05 DIAGNOSIS — G7100 Muscular dystrophy, unspecified: Secondary | ICD-10-CM | POA: Diagnosis not present

## 2017-10-05 DIAGNOSIS — R350 Frequency of micturition: Secondary | ICD-10-CM | POA: Diagnosis not present

## 2017-10-05 DIAGNOSIS — E559 Vitamin D deficiency, unspecified: Secondary | ICD-10-CM | POA: Diagnosis not present

## 2017-10-05 DIAGNOSIS — E042 Nontoxic multinodular goiter: Secondary | ICD-10-CM

## 2017-10-05 DIAGNOSIS — Z1231 Encounter for screening mammogram for malignant neoplasm of breast: Secondary | ICD-10-CM | POA: Diagnosis not present

## 2017-10-05 DIAGNOSIS — Z1239 Encounter for other screening for malignant neoplasm of breast: Secondary | ICD-10-CM

## 2017-10-05 LAB — POCT URINALYSIS DIPSTICK
BILIRUBIN UA: NEGATIVE
Glucose, UA: NEGATIVE
Ketones, UA: NEGATIVE
Nitrite, UA: NEGATIVE
Protein, UA: NEGATIVE
UROBILINOGEN UA: 0.2 U/dL
pH, UA: 6 (ref 5.0–8.0)

## 2017-10-05 NOTE — Progress Notes (Signed)
Patient: Tara Scott, Female    DOB: October 17, 1960, 57 y.o.   MRN: [id number] Visit Date: 10/05/2017  Today's Provider: Mar Daring, PA-C   Chief Complaint  Patient presents with  . Annual Exam   Subjective:    Annual physical exam Tara Scott is a 57 y.o. female who presents today for health maintenance and complete physical. She feels fairly well. She reports exercising none. She reports she is sleeping well when she stays at your sister's house, but poorly when home alone. ----------------------------------------------------------------- Patient had a Medicare AWE on 02/02/17 Colonoscopy: 03/11/2016 Mammogram- Due- ordered today Flu vaccine- today    Review of Systems  HENT: Positive for ear pain and hearing loss.   Eyes: Negative.   Respiratory: Negative.   Cardiovascular: Negative.   Gastrointestinal: Positive for abdominal pain and constipation.  Endocrine: Negative.   Genitourinary: Positive for dysuria, frequency and pelvic pain.  Musculoskeletal: Positive for back pain and myalgias.  Skin: Negative.   Allergic/Immunologic: Negative.   Neurological: Positive for weakness.  Hematological: Negative.   Psychiatric/Behavioral: Positive for decreased concentration. The patient is nervous/anxious.     Social History      She  reports that she has been smoking cigarettes.  She has a 7.50 pack-year smoking history. she has never used smokeless tobacco. She reports that she does not drink alcohol or use drugs.       Social History   Socioeconomic History  . Marital status: Widowed    Spouse name: None  . Number of children: None  . Years of education: None  . Highest education level: None  Social Needs  . Financial resource strain: None  . Food insecurity - worry: None  . Food insecurity - inability: None  . Transportation needs - medical: None  . Transportation needs - non-medical: None  Occupational History  . None  Tobacco Use  . Smoking  status: Current Every Day Smoker    Packs/day: 0.25    Years: 30.00    Pack years: 7.50    Types: Cigarettes  . Smokeless tobacco: Never Used  Substance and Sexual Activity  . Alcohol use: No    Alcohol/week: 0.0 oz  . Drug use: No  . Sexual activity: None  Other Topics Concern  . None  Social History Narrative  . None    Past Medical History:  Diagnosis Date  . Anxiety   . 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 DVT (deep venous thrombosis) ([place])   . Malignant hyperthermia   . Toxic multinodular goiter      Patient Active Problem List   Diagnosis Date Noted  . Benign essential tremor 12/26/2015  . Sinusitis 03/25/2015  . Anxiety 03/14/2015  . 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 Dizziness 03/14/2015  . MD (muscular dystrophy) 03/14/2015  . Hyperthyroidism 03/14/2015  . Arthralgia of hip or thigh 03/15/2012  . Adverse effect of malignant hyperthermia 04/16/2010  . 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 Avitaminosis D 04/16/2010  . Goiter, nontoxic, multinodular 11/04/2009  . Absence of menstruation 08/18/2009  . Acid reflux 12/16/2008  . Cannot sleep 11/17/2008  . Adnexal pain 10/04/2007  . Depression, neurotic 09/01/2007    Past Surgical History:  Procedure Laterality Date  . ADENOIDECTOMY    . CESAREAN SECTION    . COLONOSCOPY WITH PROPOFOL N/A 03/08/2016   Procedure: COLONOSCOPY WITH PROPOFOL;  Surgeon: Herbie Baltimore  Federico Flake, MD;  Location: [facility] ENDOSCOPY;  Service: Endoscopy;  Laterality: N/A;  . LAPAROTOMY  1984   OVARIAN CYST    Family History        Family Status  Relation Name Status  . Mother  Alive  . Father  Deceased  . Sister  Alive  . Brother  Alive  . Son  Alive        Her family history includes Atrial fibrillation in her sister; Bladder Cancer in her father; CVA in her mother; Colon polyps in her sister; Deep vein thrombosis in her mother and sister; Depression in  her mother; Diabetes in her son; Diverticulitis in her father; Hypercholesterolemia in her mother; Lung cancer in her father; Muscular dystrophy in her sister; Pulmonary embolism in her father; Rheum arthritis in her mother.     Allergies  Allergen Reactions  . Anesthetics, Halogenated Other (See Comments)    Other reaction(s): Other (See Comments)  . Ciprofloxacin     swelling  . Methimazole     hives  . Penicillins Other (See Comments)    RXN AS A CHILD     Current Outpatient Medications:  .  ALPRAZolam (XANAX) 0.5 MG tablet, TAKE 1 TABLET BY MOUTH EVERY DAY AS NEEDED, Disp: 30 tablet, Rfl: 5 .  citalopram (CELEXA) 40 MG tablet, TAKE 1 TABLET BY MOUTH EVERY DAY FOR DEPRESSION, Disp: 90 tablet, Rfl: 1   Patient Care Team: Mar Daring, PA-C as PCP - General (Family Medicine) Agapito Games as Consulting Physician (Optometry)      Objective:   Vitals: BP 128/72 (BP Location: Left Arm, Patient Position: Sitting, Cuff Size: Normal)   Pulse 72   Temp 98.2 F (36.8 C) (Oral)   Ht 5\' 2"  (1.575 m)   Wt 129 lb 9.6 oz (58.8 kg)   SpO2 99%   BMI 23.70 kg/m     Physical Exam  Constitutional: She is oriented to person, place, and time. She appears well-developed and well-nourished. No distress.  HENT:  Head: Normocephalic and atraumatic.  Right Ear: Hearing, tympanic membrane, external ear and ear canal normal.  Left Ear: Hearing, tympanic membrane, external ear and ear canal normal.  Nose: Nose normal.  Mouth/Throat: Uvula is midline, oropharynx is clear and moist and mucous membranes are normal. No oropharyngeal exudate.  Eyes: Conjunctivae and EOM are normal. Pupils are equal, round, and reactive to light. Right eye exhibits no discharge. Left eye exhibits no discharge. No scleral icterus.  Neck: Normal range of motion. Neck supple. No JVD present. Carotid bruit is not present. No tracheal deviation present. No thyromegaly present.  Cardiovascular: Normal rate,  regular rhythm, normal heart sounds and intact distal pulses. Exam reveals no gallop and no friction rub.  No murmur heard. Pulmonary/Chest: Effort normal and breath sounds normal. No respiratory distress. She has no wheezes. She has no rales. She exhibits no tenderness. Right breast exhibits no inverted nipple, no mass, no nipple discharge, no skin change and no tenderness. Left breast exhibits no inverted nipple, no mass, no nipple discharge, no skin change and no tenderness. Breasts are symmetrical.  Abdominal: Soft. Bowel sounds are normal. She exhibits no distension and no mass. There is no tenderness. There is no rebound and no guarding. Hernia confirmed negative in the right inguinal area and confirmed negative in the left inguinal area.  Genitourinary: Rectum normal, vagina normal and uterus normal. No breast swelling, tenderness, discharge or bleeding. Pelvic exam was performed with patient supine. There is no  rash, tenderness, lesion or injury on the right labia. There is no rash, tenderness, lesion or injury on the left labia. Cervix exhibits no motion tenderness, no discharge and no friability. Right adnexum displays no mass, no tenderness and no fullness. Left adnexum displays no mass, no tenderness and no fullness. No erythema, tenderness or bleeding in the vagina. No signs of injury around the vagina. No vaginal discharge found.  Musculoskeletal: Normal range of motion. She exhibits no edema or tenderness.  Lymphadenopathy:    She has no cervical adenopathy.       Right: No inguinal adenopathy present.       Left: No inguinal adenopathy present.  Neurological: She is alert and oriented to person, place, and time. She has normal reflexes. No cranial nerve deficit. Coordination normal.  Skin: Skin is warm and dry. No rash noted. She is not diaphoretic.  Psychiatric: She has a normal mood and affect. Her behavior is normal. Judgment and thought content normal.  Vitals  reviewed.    Depression Screen PHQ 2/9 Scores 10/05/2017 02/02/2017 12/26/2015  PHQ - 2 Score 5 6 4   PHQ- 9 Score 14 14 13       Assessment & Plan:     Routine Health Maintenance and Physical Exam  Exercise Activities and Dietary recommendations Goals    None      Immunization History  Administered Date(s) Administered  . Influenza Split 07/22/2008, 08/18/2009, 07/27/2011, 08/02/2012  . Influenza,inj,Quad PF,6+ Mos 09/05/2013, 07/23/2016, 10/05/2017  . Pneumococcal Polysaccharide-23 07/27/2011    Health Maintenance  Topic Date Due  . MAMMOGRAM  09/11/2010  . PAP SMEAR  09/07/2015  . TETANUS/TDAP  09/20/2026 (Originally 09/12/1979)  . COLONOSCOPY  03/11/2021  . INFLUENZA VACCINE  Completed  . Hepatitis C Screening  Completed  . HIV Screening  Completed     Discussed health benefits of physical activity, and encouraged her to engage in regular exercise appropriate for her age and condition.   1. Annual physical exam Normal physical exam today. Will check labs as below and f/u pending lab results. If labs are stable and WNL she will not need to have these rechecked for one year at her next annual physical exam. She is to call the office in the meantime if she has any acute issue, questions or concerns. - Lipid Profile  2. Breast cancer screening Breast exam today was normal. There is no family history of breast cancer. She does perform regular self breast exams. Mammogram was ordered as below. Information for [facility] Breast clinic was given to patient so she may schedule her mammogram at her convenience. - MM Digital Screening; Future  3. Cervical cancer screening Pap collected today. Will send as below and f/u pending results. - Pap IG and HPV (high risk) DNA detection  4. Urinary frequency UA normal. Will send for culture as below to see if anything grows. If so I will treat.  - POCT Urinalysis Dipstick - CBC w/Diff/Platelet - Comprehensive Metabolic Panel  (CMET) - Urine Culture  5. Goiter, nontoxic, multinodular H/O this. Reports had attempted thyroidectomy but patient coded due to complications from anesthesia with her MD. She has not had thyroid checked since that time. Patient also reports to being intolerant to methimazole. Will check labs as below and f/u pending results. May consider [place] to recheck nodules if labs abnormal.  - Lipid Profile - Comprehensive Metabolic Panel (CMET) - TSH  6. Hyperthyroidism See above medical treatment plan. - Lipid Profile - Comprehensive Metabolic Panel (CMET) -  TSH  7. Avitaminosis D H/O this. Will check labs as below and f/u pending results. Labs were significantly low. Will start high dose Rx supplement as below.  - CBC w/Diff/Platelet - Vitamin D (25 hydroxy) - Vitamin D, Ergocalciferol, (DRISDOL) 50000 units CAPS capsule; Take 1 capsule (50,000 Units total) by mouth every 7 (seven) days.  Dispense: 12 capsule; Refill: 1  8. Depression, neurotic Stable. Continue citalopram 40mg . Xanax 0.5mg  prn.   9. MD (muscular dystrophy) Stable. Will check labs as below and f/u pending results. - Lipid Profile - CBC w/Diff/Platelet - Comprehensive Metabolic Panel (CMET)  10. Pelvic pain Patient does have h/o having an external bladder tumor that was found on pelvic US remotely in the past. She states pelvic pain feels similar and is interested in [place] to see if any tumors have returned. I feel her pelvic pain is from pelvic floor dysfunction as patient was extremely tight and had a lot of pain on pelvic exam even with pediatric speculum. She is however not interested in pelvic floor therapy at this time.  - US Transvaginal Non-OB; Future - US Pelvis Complete; Future  11. Need for influenza vaccination Flu vaccine given today without complication. Patient sat upright for 15 minutes to check for adverse reaction before being released. - Flu Vaccine QUAD 6+ mos PF IM (Fluarix Quad  PF)  --------------------------------------------------------------------    Mar Daring, PA-C  [facility]

## 2017-10-05 NOTE — Patient Instructions (Signed)
The Female Pelvic Floor  The pelvic floor consists of several layers of muscles that cover the bottom of the pelvic cavity. These muscles have several distinct roles:  1. To support the pelvic organs, the bladder, uterus and colon within the pelvis. 2. To assist in stopping and starting the flow of urine or the passage of gas or stool.To aid in sexual appreciation.  How to Locate the Pelvic Floor Muscles  The Urine Stop Test . At the midstream of your urine flow, squeeze the pelvic floor muscles. You should feel the sensation of the openings close and the muscles pulling up and into the pelvic cavity.  If you have strong muscles you will slow or stop the stream of urine. . Stop or slow the flow of urine without tensing the muscles of your legs, buttocks. . Do this only to locate the muscles, not as a daily exercise. Feeling the Muscle . Place a fingertip on the anal opening. Contract and lift the muscles as though you are holding back gas or stool. You will feel your anal opening tighten. . Insert 1 or 2 fingers into the vagina to feel the contraction and lifting of the muscles. You should feel the opening of the vagina tighten around your finger. Watching the Muscle Contract . Begin by lying on a flat surface.  Position yourself with your knees apart and bent with your head elevated and supported on several pillows. Use a mirror to look at the anal and vaginal openings and the perineal body (the area between the two openings).  . Contract or tighten the muscles around the openings and watch for a lifting of the perineal body and closure of the openings.   . If you see a bulge or feel tissues coming out of your openings, this is an incorrect contraction and you should notify your health care provider for more instructions.  2007, [employer] Doc.11  

## 2017-10-06 ENCOUNTER — Telehealth: Payer: Self-pay

## 2017-10-06 LAB — COMPREHENSIVE METABOLIC PANEL
ALT: 31 IU/L (ref 0–32)
AST: 25 IU/L (ref 0–40)
Albumin/Globulin Ratio: 1.7 (ref 1.2–2.2)
Albumin: 4.5 g/dL (ref 3.5–5.5)
Alkaline Phosphatase: 88 IU/L (ref 39–117)
BUN / CREAT RATIO: 38 — AB (ref 9–23)
BUN: 15 mg/dL (ref 6–24)
Bilirubin Total: <0.2 mg/dL (ref 0.0–1.2)
CO2: 26 mmol/L (ref 20–29)
Calcium: 9.6 mg/dL (ref 8.7–10.2)
Chloride: 102 mmol/L (ref 96–106)
Creatinine, Ser: 0.4 mg/dL — ABNORMAL LOW (ref 0.57–1.00)
GFR calc Af Amer: 134 mL/min/{1.73_m2} (ref >59)
GFR calc non Af Amer: 116 mL/min/{1.73_m2} (ref >59)
GLOBULIN, TOTAL: 2.7 g/dL (ref 1.5–4.5)
Glucose: 73 mg/dL (ref 65–99)
Potassium: 4.1 mmol/L (ref 3.5–5.2)
SODIUM: 142 mmol/L (ref 134–144)
Total Protein: 7.2 g/dL (ref 6.0–8.5)

## 2017-10-06 LAB — CBC WITH DIFFERENTIAL/PLATELET
BASOS: 1 %
Basophils Absolute: 0 10*3/uL (ref 0.0–0.2)
EOS (ABSOLUTE): 0.1 10*3/uL (ref 0.0–0.4)
Eos: 2 %
HEMATOCRIT: 42 % (ref 34.0–46.6)
HEMOGLOBIN: 13.7 g/dL (ref 11.1–15.9)
IMMATURE GRANULOCYTES: 0 %
Immature Grans (Abs): 0 10*3/uL (ref 0.0–0.1)
Lymphocytes Absolute: 2 10*3/uL (ref 0.7–3.1)
Lymphs: 34 %
MCH: 31.5 pg (ref 26.6–33.0)
MCHC: 32.6 g/dL (ref 31.5–35.7)
MCV: 97 fL (ref 79–97)
MONOCYTES: 11 %
Monocytes Absolute: 0.7 10*3/uL (ref 0.1–0.9)
Neutrophils Absolute: 3.1 10*3/uL (ref 1.4–7.0)
Neutrophils: 52 %
Platelets: 274 10*3/uL (ref 150–379)
RBC: 4.35 x10E6/uL (ref 3.77–5.28)
RDW: 13.1 % (ref 12.3–15.4)
WBC: 5.9 10*3/uL (ref 3.4–10.8)

## 2017-10-06 LAB — LIPID PANEL
CHOLESTEROL TOTAL: 197 mg/dL (ref 100–199)
Chol/HDL Ratio: 3 ratio (ref 0.0–4.4)
HDL: 65 mg/dL (ref >39)
LDL Calculated: 108 mg/dL — ABNORMAL HIGH (ref 0–99)
Triglycerides: 120 mg/dL (ref 0–149)
VLDL Cholesterol Cal: 24 mg/dL (ref 5–40)

## 2017-10-06 LAB — VITAMIN D 25 HYDROXY (VIT D DEFICIENCY, FRACTURES): Vit D, 25-Hydroxy: 13.6 ng/mL — ABNORMAL LOW (ref 30.0–100.0)

## 2017-10-06 LAB — TSH: TSH: <0.006 u[IU]/mL — ABNORMAL LOW (ref 0.450–4.500)

## 2017-10-06 MED ORDER — VITAMIN D (ERGOCALCIFEROL) 1.25 MG (50000 UNIT) PO CAPS: 50000.0000 [IU] | ORAL_CAPSULE | ORAL | 1 refills | Status: DC

## 2017-10-06 NOTE — Telephone Encounter (Signed)
Advised patient of results. Patient reports that she is going to call her "thyriod doctor" and schedule and appt. She was supposed to have several tests done and she did not go. She will keep [place] updated. Just FYI. She also had several questions about her pelvic pain. She scheduled another appt to discuss with you on 10/10/17.

## 2017-10-06 NOTE — Telephone Encounter (Signed)
-----   Message from Mar Daring, PA-C sent at 10/06/2017  8:10 AM EST ----- Vit D is low at 13.6. Would recommend to start a high dose prescription strength Vit D that you take once weekly that I will send in for you. Thyroid hormone is still overactive. We could get another US of the nodules since it has been a while to see if they are stable then go from there. Cholesterol WNL. Blood count normal. Kidney and liver function normal.

## 2017-10-06 NOTE — Telephone Encounter (Signed)
Noted  

## 2017-10-07 LAB — URINE CULTURE: ORGANISM ID, BACTERIA: NO GROWTH

## 2017-10-07 LAB — PAP IG AND HPV HIGH-RISK
HPV, high-risk: NEGATIVE
PAP Smear Comment: 0

## 2017-10-10 ENCOUNTER — Other Ambulatory Visit: Payer: Self-pay | Admitting: Physician Assistant

## 2017-10-10 ENCOUNTER — Ambulatory Visit: Payer: PPO | Admitting: Physician Assistant

## 2017-10-10 ENCOUNTER — Telehealth: Payer: Self-pay

## 2017-10-10 ENCOUNTER — Ambulatory Visit
Admission: RE | Admit: 2017-10-10 | Discharge: 2017-10-10 | Disposition: A | Discharge status: Home / Self Care | Payer: PPO | Source: Ambulatory Visit | Attending: Physician Assistant | Admitting: Physician Assistant

## 2017-10-10 DIAGNOSIS — F419 Anxiety disorder, unspecified: Secondary | ICD-10-CM

## 2017-10-10 DIAGNOSIS — R102 Pelvic and perineal pain: Secondary | ICD-10-CM | POA: Insufficient documentation

## 2017-10-10 MED ORDER — CITALOPRAM HYDROBROMIDE 40 MG PO TABS: ORAL_TABLET | ORAL | 1 refills | Status: DC

## 2017-10-10 NOTE — Telephone Encounter (Signed)
[facility] pharmacy faxed a refill request for a 90-days supply for the following medication. Thanks CC ° °citalopram (CELEXA) 40 MG tablet  ° °

## 2017-10-10 NOTE — Telephone Encounter (Signed)
-----   Message from Mar Daring, [place] sent at 10/10/2017  3:02 PM EST ----- Urine culture was negative. Pap unfortunately was unsatisfactory but HPV negative. Can repeat at her convenience at no charge, or we can get next year at CPE since HPV negative. Patient preference.

## 2017-10-10 NOTE — Telephone Encounter (Signed)
LMTCB  Thanks,  -Joseline 

## 2017-10-10 NOTE — Progress Notes (Deleted)
       Patient: Tara Scott Female    DOB: October 01, 1960   57 y.o.   MRN: [id number] Visit Date: 10/10/2017  Today's Provider: Mar Daring, PA-C   No chief complaint on file.  Subjective:    HPI Patient is here today with c/o pelvic pain     Allergies  Allergen Reactions  . Anesthetics, Halogenated Other (See Comments)    Other reaction(s): Other (See Comments)  . Ciprofloxacin     swelling  . Methimazole     hives  . Penicillins Other (See Comments)    RXN AS A CHILD     Current Outpatient Medications:  .  ALPRAZolam (XANAX) 0.5 MG tablet, TAKE 1 TABLET BY MOUTH EVERY DAY AS NEEDED, Disp: 30 tablet, Rfl: 5 .  citalopram (CELEXA) 40 MG tablet, TAKE 1 TABLET BY MOUTH EVERY DAY FOR DEPRESSION, Disp: 90 tablet, Rfl: 1 .  Vitamin D, Ergocalciferol, (DRISDOL) 50000 units CAPS capsule, Take 1 capsule (50,000 Units total) by mouth every 7 (seven) days., Disp: 12 capsule, Rfl: 1  Review of Systems  Social History   Tobacco Use  . Smoking status: Current Every Day Smoker    Packs/day: 0.25    Years: 30.00    Pack years: 7.50    Types: Cigarettes  . Smokeless tobacco: Never Used  Substance Use Topics  . Alcohol use: No    Alcohol/week: 0.0 oz   Objective:   There were no vitals taken for this visit.   Physical Exam      Assessment & Plan:           Mar Daring, PA-C  [facility]

## 2017-10-10 NOTE — Telephone Encounter (Signed)
-----   Message from Mar Daring, [place] sent at 10/10/2017  3:01 PM EST ----- Pelvic US was normal.

## 2017-10-11 NOTE — Telephone Encounter (Signed)
Patient advised as below.  

## 2017-11-30 DIAGNOSIS — J019 Acute sinusitis, unspecified: Secondary | ICD-10-CM | POA: Diagnosis not present

## 2018-04-05 ENCOUNTER — Telehealth: Payer: Self-pay

## 2018-04-05 ENCOUNTER — Ambulatory Visit: Payer: PPO

## 2018-04-05 NOTE — Telephone Encounter (Signed)
LM for pt to CB. Pt n/s 2:00 AWV today. -MM

## 2018-04-13 ENCOUNTER — Other Ambulatory Visit: Payer: Self-pay | Admitting: Physician Assistant

## 2018-04-13 DIAGNOSIS — E559 Vitamin D deficiency, unspecified: Secondary | ICD-10-CM

## 2018-04-14 ENCOUNTER — Other Ambulatory Visit: Payer: Self-pay | Admitting: Physician Assistant

## 2018-04-14 DIAGNOSIS — F419 Anxiety disorder, unspecified: Secondary | ICD-10-CM

## 2018-04-26 NOTE — Telephone Encounter (Signed)
Note left on upcoming apt on 04/26/18, requesting someone to schedule an AWV for her next apt.

## 2018-04-28 ENCOUNTER — Ambulatory Visit (INDEPENDENT_AMBULATORY_CARE_PROVIDER_SITE_OTHER): Payer: PPO | Admitting: Physician Assistant

## 2018-04-28 ENCOUNTER — Encounter: Payer: Self-pay | Admitting: Physician Assistant

## 2018-04-28 ENCOUNTER — Other Ambulatory Visit: Payer: Self-pay | Admitting: Physician Assistant

## 2018-04-28 VITALS — BP 118/66 | HR 70 | Temp 98.1°F | Resp 16 | Ht 62.0 in | Wt 127.0 lb

## 2018-04-28 DIAGNOSIS — E0590 Thyrotoxicosis, unspecified without thyrotoxic crisis or storm: Secondary | ICD-10-CM

## 2018-04-28 DIAGNOSIS — E042 Nontoxic multinodular goiter: Secondary | ICD-10-CM

## 2018-04-28 DIAGNOSIS — F4381 Prolonged grief disorder: Secondary | ICD-10-CM | POA: Insufficient documentation

## 2018-04-28 DIAGNOSIS — F4321 Adjustment disorder with depressed mood: Secondary | ICD-10-CM | POA: Diagnosis not present

## 2018-04-28 DIAGNOSIS — E559 Vitamin D deficiency, unspecified: Secondary | ICD-10-CM

## 2018-04-28 DIAGNOSIS — F4329 Adjustment disorder with other symptoms: Secondary | ICD-10-CM

## 2018-04-28 DIAGNOSIS — G7100 Muscular dystrophy, unspecified: Secondary | ICD-10-CM

## 2018-04-28 DIAGNOSIS — G479 Sleep disorder, unspecified: Secondary | ICD-10-CM | POA: Diagnosis not present

## 2018-04-28 DIAGNOSIS — F419 Anxiety disorder, unspecified: Secondary | ICD-10-CM

## 2018-04-28 MED ORDER — TRAZODONE HCL 50 MG PO TABS: 25.0000 mg | ORAL_TABLET | Freq: Every evening | ORAL | 1 refills | Status: DC | PRN

## 2018-04-28 NOTE — Progress Notes (Signed)
Patient: Tara Scott Female    DOB: 1961-08-22   57 y.o.   MRN: [id number] Visit Date: 04/28/2018  Today's Provider: Mar Daring, PA-C   Chief Complaint  Patient presents with  . Alopecia   Subjective:    HPI Patient here today requesting to have labs checked for Vitamin D and thyroid checked. Patient reports worsening concentration, hair loss and loss off appetite. Patient reports that symptoms have been going one since her husbands death, about 3 years ago. Patient is here today with her sister. Patient reports she misses some days taking citalopram.   Hyperthyroid: Patient has known hyperthyroidism. Her mother had underwent an ablation therapy and then had a stroke following so they have always been worried of adverse events. Her sister also has hyperthyroidism and is currently treated with methimazole. She has previously had work up in 2011 initially and they attempted a thyroidectomy after failure of methimazole and PTU (developed severe rash after both per patient). During initial anesthesia she developed malignant hyperthermia and the procedure was stopped. She was then re-evaluated last year by Dr. Gabriel Carina and it was recommended to undergo iodine ablation. She had said she would call to schedule but never called back.   Depression screen [facility] 2/9 04/28/2018 10/05/2017 02/02/2017 12/26/2015  Decreased Interest 3 3 3 2   Down, Depressed, Hopeless 3 2 3 2   PHQ - 2 Score 6 5 6 4   Altered sleeping 2 2 2 3   Tired, decreased energy 2 1 2 2   Change in appetite 3 2 1  0  Feeling bad or failure about yourself  3 2 1  0  Trouble concentrating 2 2 0 3  Moving slowly or fidgety/restless 1 0 0 1  Suicidal thoughts 0 0 2 0  PHQ-9 Score 19 14 14 13   Difficult doing work/chores Somewhat difficult Very difficult Very difficult Somewhat difficult      Allergies  Allergen Reactions  . Anesthetics, Halogenated Other (See Comments)    Other reaction(s): Other (See Comments)  .  Ciprofloxacin     swelling  . Methimazole     hives  . Penicillins Other (See Comments)    RXN AS A CHILD     Current Outpatient Medications:  .  ALPRAZolam (XANAX) 0.5 MG tablet, TAKE 1 TABLET BY MOUTH EVERY DAY AS NEEDED, Disp: 30 tablet, Rfl: 5 .  citalopram (CELEXA) 40 MG tablet, TAKE 1 TABLET BY MOUTH EVERY DAY FOR DEPRESSION, Disp: 90 tablet, Rfl: 1 .  Vitamin D, Ergocalciferol, (DRISDOL) 50000 units CAPS capsule, TAKE 1 CAPSULE (50,000 UNITS TOTAL) BY MOUTH EVERY 7 (SEVEN) DAYS., Disp: 12 capsule, Rfl: 1  Review of Systems  Constitutional: Positive for activity change, appetite change and fatigue.  Respiratory: Negative.   Cardiovascular: Negative.   Gastrointestinal: Positive for nausea.  Neurological: Positive for weakness (MD).  Psychiatric/Behavioral: Positive for decreased concentration, dysphoric mood and sleep disturbance. The patient is nervous/anxious.     Social History   Tobacco Use  . Smoking status: Current Every Day Smoker    Packs/day: 0.25    Years: 30.00    Pack years: 7.50    Types: Cigarettes  . Smokeless tobacco: Never Used  Substance Use Topics  . Alcohol use: No    Alcohol/week: 0.0 standard drinks   Objective:   BP 118/66 (BP Location: Left Arm, Patient Position: Sitting, Cuff Size: Normal)   Pulse 70   Temp 98.1 F (36.7 C) (Oral)   Resp 16  Ht 5\' 2"  (1.575 m)   Wt 127 lb (57.6 kg)   SpO2 98%   BMI 23.23 kg/m  Vitals:   04/28/18 1341  BP: 118/66  Pulse: 70  Resp: 16  Temp: 98.1 F (36.7 C)  TempSrc: Oral  SpO2: 98%  Weight: 127 lb (57.6 kg)  Height: 5\' 2"  (1.575 m)     Physical Exam  Constitutional: She appears well-developed and well-nourished. No distress.  Neck: Normal range of motion. Neck supple. No JVD present. No tracheal deviation present. Thyromegaly present.  Cardiovascular: Normal rate, regular rhythm and normal heart sounds. Exam reveals no gallop and no friction rub.  No murmur heard. Pulmonary/Chest:  Effort normal and breath sounds normal. No respiratory distress. She has no wheezes. She has no rales.  Lymphadenopathy:    She has no cervical adenopathy.  Skin: She is not diaphoretic.  Psychiatric: Her speech is normal. Judgment normal. Her mood appears anxious. She is slowed. Cognition and memory are normal. She exhibits a depressed mood. She expresses no homicidal and no suicidal ideation.  Vitals reviewed.     Assessment & Plan:     1. Hyperthyroidism I do feel most of the patient's symptoms are secondary to her uncontrolled anxiety and depression from the prolonged bereavement she has had for her husband that passed 3 years ago, but also likely that symptoms are being exacerbated by uncontrolled hyperthyroidism. Patient feels she is in a better place at this time and is willing to want to get better, where before she had no desire. Will check labs as below and f/u pending results. Referral placed back to Dr. Gabriel Carina to see if iodine ablation is still an option for her and to schedule. She is in agreement to go. She is to call the office in the meantime if symptoms worsen.  - CBC w/Diff/Platelet - T4 AND TSH - Ambulatory referral to Endocrinology  2. Goiter, nontoxic, multinodular See above medical treatment plan. - CBC w/Diff/Platelet - T4 AND TSH - Ambulatory referral to Endocrinology  3. Avitaminosis D Patient does have vitamin d def and is suppose to be taking vit d 50,000IU weekly. She is unsure if she is truly taking this medication regularly. Will check labs as below and f/u pending results. - CBC w/Diff/Platelet - T4 AND TSH - Vitamin D (25 hydroxy)  4. MD (muscular dystrophy) ([place]) Mild. Stable.  - CBC w/Diff/Platelet - T4 AND TSH  5. Difficulty sleeping Patient reports having a hard time falling asleep. She will lie down and lie awake for hours, sometimes until dawn before falling asleep. Then she will sleep all day. Discussed sleep hygiene and sleep meditation  techniques. Also will add trazodone as below for her to try. I will see her back in 4-6 weeks to see how she is doing with this medication.  - traZODone (DESYREL) 50 MG tablet; Take 0.5-1 tablets (25-50 mg total) by mouth at bedtime as needed for sleep.  Dispense: 30 tablet; Refill: 1  6. Grief reaction with prolonged bereavement Not taking citalopram regularly. Advised to get a pill organizer and place in so she can keep up with whether she has taken the medication or not. After she has started to take this more regularly we will see how her symptoms are doing at the 4-6 week f/u. If still no improvement may change therapy as she has been on this for many years. Also discussed counseling but patient declines, stating "I have done this already."  Mar Daring, PA-C  [facility]

## 2018-04-28 NOTE — Patient Instructions (Signed)
Trazodone tablets What is this medicine? TRAZODONE (TRAZ oh done) is used to treat depression. This medicine may be used for other purposes; ask your health care provider or pharmacist if you have questions. COMMON BRAND NAME(S): Desyrel What should I tell my health care provider before I take this medicine? They need to know if you have any of these conditions: -attempted suicide or thinking about it -bipolar disorder -bleeding problems -glaucoma -heart disease, or previous heart attack -irregular heart beat -kidney or liver disease -low levels of sodium in the blood -an unusual or allergic reaction to trazodone, other medicines, foods, dyes or preservatives -pregnant or trying to get pregnant -breast-feeding How should I use this medicine? Take this medicine by mouth with a glass of water. Follow the directions on the prescription label. Take this medicine shortly after a meal or a light snack. Take your medicine at regular intervals. Do not take your medicine more often than directed. Do not stop taking this medicine suddenly except upon the advice of your doctor. Stopping this medicine too quickly may cause serious side effects or your condition may worsen. A special MedGuide will be given to you by the pharmacist with each prescription and refill. Be sure to read this information carefully each time. Talk to your pediatrician regarding the use of this medicine in children. Special care may be needed. Overdosage: If you think you have taken too much of this medicine contact a poison control center or emergency room at once. NOTE: This medicine is only for you. Do not share this medicine with others. What if I miss a dose? If you miss a dose, take it as soon as you can. If it is almost time for your next dose, take only that dose. Do not take double or extra doses. What may interact with this medicine? Do not take this medicine with any of the following medications: -certain medicines  for fungal infections like fluconazole, itraconazole, ketoconazole, posaconazole, voriconazole -cisapride -dofetilide -dronedarone -linezolid -MAOIs like Carbex, Eldepryl, Marplan, Nardil, and Parnate -mesoridazine -methylene blue (injected into a vein) -pimozide -saquinavir -thioridazine -ziprasidone This medicine may also interact with the following medications: -alcohol -antiviral medicines for HIV or AIDS -aspirin and aspirin-like medicines -barbiturates like phenobarbital -certain medicines for blood pressure, heart disease, irregular heart beat -certain medicines for depression, anxiety, or psychotic disturbances -certain medicines for migraine headache like almotriptan, eletriptan, frovatriptan, naratriptan, rizatriptan, sumatriptan, zolmitriptan -certain medicines for seizures like carbamazepine and phenytoin -certain medicines for sleep -certain medicines that treat or prevent blood clots like dalteparin, enoxaparin, warfarin -digoxin -fentanyl -lithium -NSAIDS, medicines for pain and inflammation, like ibuprofen or naproxen -other medicines that prolong the QT interval (cause an abnormal heart rhythm) -rasagiline -supplements like St. John's wort, kava kava, valerian -tramadol -tryptophan This list may not describe all possible interactions. Give your health care provider a list of all the medicines, herbs, non-prescription drugs, or dietary supplements you use. Also tell them if you smoke, drink alcohol, or use illegal drugs. Some items may interact with your medicine. What should I watch for while using this medicine? Tell your doctor if your symptoms do not get better or if they get worse. Visit your doctor or health care professional for regular checks on your progress. Because it may take several weeks to see the full effects of this medicine, it is important to continue your treatment as prescribed by your doctor. Patients and their families should watch out for new  or worsening thoughts of suicide or depression. Also   watch out for sudden changes in feelings such as feeling anxious, agitated, panicky, irritable, hostile, aggressive, impulsive, severely restless, overly excited and hyperactive, or not being able to sleep. If this happens, especially at the beginning of treatment or after a change in dose, call your health care professional. You may get drowsy or dizzy. Do not drive, use machinery, or do anything that needs mental alertness until you know how this medicine affects you. Do not stand or sit up quickly, especially if you are an older patient. This reduces the risk of dizzy or fainting spells. Alcohol may interfere with the effect of this medicine. Avoid alcoholic drinks. This medicine may cause dry eyes and blurred vision. If you wear contact lenses you may feel some discomfort. Lubricating drops may help. See your eye doctor if the problem does not go away or is severe. Your mouth may get dry. Chewing sugarless gum, sucking hard candy and drinking plenty of water may help. Contact your doctor if the problem does not go away or is severe. What side effects may I notice from receiving this medicine? Side effects that you should report to your doctor or health care professional as soon as possible: -allergic reactions like skin rash, itching or hives, swelling of the face, lips, or tongue -elevated mood, decreased need for sleep, racing thoughts, impulsive behavior -confusion -fast, irregular heartbeat -feeling faint or lightheaded, falls -feeling agitated, angry, or irritable -loss of balance or coordination -painful or prolonged erections -restlessness, pacing, inability to keep still -suicidal thoughts or other mood changes -tremors -trouble sleeping -seizures -unusual bleeding or bruising Side effects that usually do not require medical attention (report to your doctor or health care professional if they continue or are bothersome): -change in  sex drive or performance -change in appetite or weight -constipation -headache -muscle aches or pains -nausea This list may not describe all possible side effects. Call your doctor for medical advice about side effects. You may report side effects to FDA at 1-800-FDA-1088. Where should I keep my medicine? Keep out of the reach of children. Store at room temperature between 15 and 30 degrees C (59 to 86 degrees F). Protect from light. Keep container tightly closed. Throw away any unused medicine after the expiration date. NOTE: This sheet is a summary. It may not cover all possible information. If you have questions about this medicine, talk to your doctor, pharmacist, or health care provider.  2018 Elsevier/Gold Standard (2016-02-05 16:57:05)  

## 2018-04-29 LAB — T4 AND TSH
T4, Total: 9.6 ug/dL (ref 4.5–12.0)
TSH: <0.006 u[IU]/mL — ABNORMAL LOW (ref 0.450–4.500)

## 2018-04-29 LAB — CBC WITH DIFFERENTIAL/PLATELET
BASOS: 0 %
Basophils Absolute: 0 10*3/uL (ref 0.0–0.2)
EOS (ABSOLUTE): 0.1 10*3/uL (ref 0.0–0.4)
Eos: 2 %
Hematocrit: 40.2 % (ref 34.0–46.6)
Hemoglobin: 13.6 g/dL (ref 11.1–15.9)
IMMATURE GRANS (ABS): 0 10*3/uL (ref 0.0–0.1)
Immature Granulocytes: 0 %
LYMPHS ABS: 1.6 10*3/uL (ref 0.7–3.1)
LYMPHS: 35 %
MCH: 32.4 pg (ref 26.6–33.0)
MCHC: 33.8 g/dL (ref 31.5–35.7)
MCV: 96 fL (ref 79–97)
Monocytes Absolute: 0.4 10*3/uL (ref 0.1–0.9)
Monocytes: 9 %
NEUTROS ABS: 2.5 10*3/uL (ref 1.4–7.0)
Neutrophils: 54 %
Platelets: 247 10*3/uL (ref 150–450)
RBC: 4.2 x10E6/uL (ref 3.77–5.28)
RDW: 13 % (ref 12.3–15.4)
WBC: 4.5 10*3/uL (ref 3.4–10.8)

## 2018-04-29 LAB — VITAMIN D 25 HYDROXY (VIT D DEFICIENCY, FRACTURES): Vit D, 25-Hydroxy: 46.2 ng/mL (ref 30.0–100.0)

## 2018-05-01 ENCOUNTER — Telehealth: Payer: Self-pay

## 2018-05-01 ENCOUNTER — Telehealth: Payer: Self-pay | Admitting: Family Medicine

## 2018-05-01 DIAGNOSIS — R112 Nausea with vomiting, unspecified: Secondary | ICD-10-CM

## 2018-05-01 MED ORDER — ONDANSETRON HCL 4 MG PO TABS: 4.0000 mg | ORAL_TABLET | Freq: Three times a day (TID) | ORAL | 0 refills | Status: DC | PRN

## 2018-05-01 NOTE — Telephone Encounter (Signed)
lmtcb

## 2018-05-01 NOTE — Telephone Encounter (Signed)
LMTCB  Thanks,  -Joseline 

## 2018-05-01 NOTE — Telephone Encounter (Signed)
I can send in some medication for her for nausea and vomiting. Does she have a fever or any blood noted in the emesis? If so she may need to be seen sooner.  If no improvements she should call the office.

## 2018-05-01 NOTE — Telephone Encounter (Signed)
-----   Message from Mar Daring, [place] sent at 05/01/2018 12:59 PM EDT ----- Vit D is normal. Blood count is normal. Thyroid however continues to be in an overactive pattern as expected. Continue with referral to endocrinology for further treatment options as discussed.

## 2018-05-01 NOTE — Telephone Encounter (Signed)
Pt started getting sick this morning with nausea, throwing up and stomach pains.  She thinks it may be food poison but not sure.  Please advise   CB # [phone]  thanks Con Memos

## 2018-05-02 NOTE — Telephone Encounter (Signed)
Wonderful. 

## 2018-05-02 NOTE — Telephone Encounter (Signed)
Patient reports she is feeling much better today. Patient has been able to eat and drink this morning.

## 2018-05-02 NOTE — Telephone Encounter (Signed)
Patient advised as below.  

## 2018-05-10 DIAGNOSIS — E0520 Thyrotoxicosis with toxic multinodular goiter without thyrotoxic crisis or storm: Secondary | ICD-10-CM | POA: Diagnosis not present

## 2018-05-15 ENCOUNTER — Other Ambulatory Visit: Payer: Self-pay | Admitting: Internal Medicine

## 2018-05-15 DIAGNOSIS — E0520 Thyrotoxicosis with toxic multinodular goiter without thyrotoxic crisis or storm: Secondary | ICD-10-CM

## 2018-05-16 ENCOUNTER — Encounter
Admission: RE | Admit: 2018-05-16 | Discharge: 2018-05-16 | Disposition: A | Discharge status: Home / Self Care | Payer: PPO | Source: Ambulatory Visit | Attending: Internal Medicine | Admitting: Internal Medicine

## 2018-05-16 DIAGNOSIS — E0520 Thyrotoxicosis with toxic multinodular goiter without thyrotoxic crisis or storm: Secondary | ICD-10-CM

## 2018-05-16 MED ORDER — SODIUM IODIDE I-123 7.4 MBQ CAPS
135.6830 | ORAL_CAPSULE | Freq: Once | ORAL | Status: AC
  Administered 2018-05-16: 135.683 via ORAL

## 2018-05-17 ENCOUNTER — Encounter
Admission: RE | Admit: 2018-05-17 | Discharge: 2018-05-17 | Disposition: A | Discharge status: Home / Self Care | Payer: PPO | Source: Ambulatory Visit | Attending: Internal Medicine | Admitting: Internal Medicine

## 2018-05-17 DIAGNOSIS — E0520 Thyrotoxicosis with toxic multinodular goiter without thyrotoxic crisis or storm: Secondary | ICD-10-CM | POA: Diagnosis not present

## 2018-05-18 ENCOUNTER — Other Ambulatory Visit: Payer: Self-pay | Admitting: Internal Medicine

## 2018-05-18 DIAGNOSIS — E0520 Thyrotoxicosis with toxic multinodular goiter without thyrotoxic crisis or storm: Secondary | ICD-10-CM

## 2018-05-19 ENCOUNTER — Encounter
Admission: RE | Admit: 2018-05-19 | Discharge: 2018-05-19 | Disposition: A | Discharge status: Home / Self Care | Payer: PPO | Source: Ambulatory Visit | Attending: Internal Medicine | Admitting: Internal Medicine

## 2018-05-19 DIAGNOSIS — E0520 Thyrotoxicosis with toxic multinodular goiter without thyrotoxic crisis or storm: Secondary | ICD-10-CM | POA: Diagnosis not present

## 2018-05-19 DIAGNOSIS — E042 Nontoxic multinodular goiter: Secondary | ICD-10-CM | POA: Diagnosis not present

## 2018-05-19 MED ORDER — SODIUM IODIDE I 131 CAPSULE
18.0000 | Freq: Once | INTRAVENOUS | Status: AC | PRN
  Administered 2018-05-19: 18.1 via ORAL

## 2018-05-20 ENCOUNTER — Other Ambulatory Visit: Payer: Self-pay | Admitting: Physician Assistant

## 2018-05-20 DIAGNOSIS — G479 Sleep disorder, unspecified: Secondary | ICD-10-CM

## 2018-05-23 ENCOUNTER — Other Ambulatory Visit: Payer: PPO

## 2018-05-24 ENCOUNTER — Other Ambulatory Visit: Payer: PPO

## 2018-05-24 NOTE — Telephone Encounter (Signed)
Tried to contact pt on 5 different occasions and pt has not CB. Note left on 05/26/18 apt requesting pt to schedule the AWV at that apt. FYI.[name] -MM

## 2018-05-26 ENCOUNTER — Ambulatory Visit: Payer: Self-pay | Admitting: Physician Assistant

## 2018-06-16 DIAGNOSIS — E0520 Thyrotoxicosis with toxic multinodular goiter without thyrotoxic crisis or storm: Secondary | ICD-10-CM | POA: Diagnosis not present

## 2018-07-28 DIAGNOSIS — E0520 Thyrotoxicosis with toxic multinodular goiter without thyrotoxic crisis or storm: Secondary | ICD-10-CM | POA: Diagnosis not present

## 2018-08-24 ENCOUNTER — Ambulatory Visit: Payer: Self-pay | Admitting: Family Medicine

## 2018-08-25 ENCOUNTER — Ambulatory Visit: Payer: Self-pay | Admitting: Family Medicine

## 2018-08-30 ENCOUNTER — Emergency Department
Admission: EM | Admit: 2018-08-30 | Discharge: 2018-08-31 | Disposition: A | Discharge status: Home / Self Care | Payer: PPO | Attending: Emergency Medicine | Admitting: Emergency Medicine

## 2018-08-30 ENCOUNTER — Other Ambulatory Visit: Payer: Self-pay

## 2018-08-30 ENCOUNTER — Emergency Department: Payer: PPO

## 2018-08-30 DIAGNOSIS — G501 Atypical facial pain: Secondary | ICD-10-CM | POA: Diagnosis not present

## 2018-08-30 DIAGNOSIS — R51 Headache: Secondary | ICD-10-CM

## 2018-08-30 DIAGNOSIS — Z79899 Other long term (current) drug therapy: Secondary | ICD-10-CM | POA: Diagnosis not present

## 2018-08-30 DIAGNOSIS — R519 Headache, unspecified: Secondary | ICD-10-CM

## 2018-08-30 DIAGNOSIS — F17210 Nicotine dependence, cigarettes, uncomplicated: Secondary | ICD-10-CM | POA: Diagnosis not present

## 2018-08-30 DIAGNOSIS — H9202 Otalgia, left ear: Secondary | ICD-10-CM | POA: Diagnosis not present

## 2018-08-30 DIAGNOSIS — R6884 Jaw pain: Secondary | ICD-10-CM | POA: Diagnosis not present

## 2018-08-30 MED ORDER — DEXAMETHASONE SODIUM PHOSPHATE 10 MG/ML IJ SOLN
10.0000 mg | Freq: Once | INTRAMUSCULAR | Status: AC
  Administered 2018-08-30: 10 mg via INTRAVENOUS
  Filled 2018-08-30: qty 1

## 2018-08-30 MED ORDER — KETOROLAC TROMETHAMINE 30 MG/ML IJ SOLN
15.0000 mg | Freq: Once | INTRAMUSCULAR | Status: AC
  Administered 2018-08-30: 15 mg via INTRAVENOUS
  Filled 2018-08-30: qty 1

## 2018-08-30 MED ORDER — OXYCODONE-ACETAMINOPHEN 5-325 MG PO TABS
1.0000 | ORAL_TABLET | Freq: Once | ORAL | Status: AC
  Administered 2018-08-30: 1 via ORAL
  Filled 2018-08-30: qty 1

## 2018-08-30 NOTE — ED Provider Notes (Signed)
[facility] Emergency Department Provider Note   ____________________________________________   First MD Initiated Contact with Patient 08/30/18 2304     (approximate)  I have reviewed the triage vital signs and the nursing notes.   HISTORY  Chief Complaint Headache    HPI Tara Scott is a 57 y.o. female who presents to the ED from home with a chief complaint of left ear/facial pain.  Patient reports onset of symptoms 5 days ago.  Complains of sporadic and irregular sharp pain mostly to her left ear but also to her left teeth and jaw.  Denies headache.  Denies feeling of lightning bolts or electric shocks.  Denies vision changes or neck pain.  She was seen at acute care this morning and prescribed prednisone, tramadol and doxycycline.  Reports no relief from tramadol.  Has not yet started her prednisone.  Denies associated fever, chills, chest pain, shortness of breath, abdominal pain, nausea or vomiting.  Denies recent travel or trauma.   Past Medical History:  Diagnosis Date  . Anxiety   . 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 DVT (deep venous thrombosis) ([place])   . Malignant hyperthermia   . Toxic multinodular goiter     Patient Active Problem List   Diagnosis Date Noted  . Grief reaction with prolonged bereavement 04/28/2018  . Difficulty sleeping 04/28/2018  . Benign essential tremor 12/26/2015  . Sinusitis 03/25/2015  . Anxiety 03/14/2015  . 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 Dizziness 03/14/2015  . MD (muscular dystrophy) ([place]) 03/14/2015  . Hyperthyroidism 03/14/2015  . Arthralgia of hip or thigh 03/15/2012  . Adverse effect of malignant hyperthermia 04/16/2010  . 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 Avitaminosis D 04/16/2010  . Goiter, nontoxic, multinodular 11/04/2009  . Absence of menstruation 08/18/2009  . Acid reflux 12/16/2008  . Cannot sleep  11/17/2008  . Adnexal pain 10/04/2007  . Depression, neurotic 09/01/2007    Past Surgical History:  Procedure Laterality Date  . ADENOIDECTOMY    . CESAREAN SECTION    . COLONOSCOPY WITH PROPOFOL N/A 03/08/2016   Procedure: COLONOSCOPY WITH PROPOFOL;  Surgeon: Manya Silvas, MD;  Location[facility] ENDOSCOPY;  Service: Endoscopy;  Laterality: N/A;  . LAPAROTOMY  1984   OVARIAN CYST    Prior to Admission medications   Medication Sig Start Date End Date Taking? Authorizing Provider  ALPRAZolam Duanne Moron) 0.5 MG tablet TAKE 1 TABLET BY MOUTH EVERY DAY AS NEEDED 04/28/18   Mar Daring, PA-C  citalopram (CELEXA) 40 MG tablet TAKE 1 TABLET BY MOUTH EVERY DAY FOR DEPRESSION 04/14/18   Mar Daring, PA-C  ondansetron (ZOFRAN) 4 MG tablet Take 1 tablet (4 mg total) by mouth every 8 (eight) hours as needed for nausea or vomiting. 05/01/18   Mar Daring, PA-C  traZODone (DESYREL) 50 MG tablet TAKE 1/2 TO 1 TABLET BY MOUTH AT BEDTIME AS NEEDED FOR SLEEP 05/23/18   Mar Daring, PA-C  Vitamin D, Ergocalciferol, (DRISDOL) 50000 units CAPS capsule TAKE 1 CAPSULE (50,000 UNITS TOTAL) BY MOUTH EVERY 7 (SEVEN) DAYS. 04/13/18   Mar Daring, PA-C    Allergies Anesthetics, halogenated; Ciprofloxacin; Methimazole; and Penicillins  Family History  Problem Relation Age of Onset  . CVA Mother   . Hypercholesterolemia Mother   . Depression Mother   . Rheum arthritis Mother   . Deep vein thrombosis Mother   . Bladder  Cancer Father   . Diverticulitis Father   . Pulmonary embolism Father   . Lung cancer Father   . Deep vein thrombosis Sister   . Colon polyps Sister   . Atrial fibrillation Sister   . Muscular dystrophy Sister   . Diabetes Son        type 2    Social History Social History   Tobacco Use  . Smoking status: Current Every Day Smoker    Packs/day: 0.25    Years: 30.00    Pack years: 7.50    Types: Cigarettes  . Smokeless tobacco: Never Used    Substance Use Topics  . Alcohol use: No    Alcohol/week: 0.0 standard drinks  . Drug use: No    Review of Systems  Constitutional: No fever/chills Eyes: No visual changes. ENT: Positive for left ear, face and jaw pain.  No sore throat. Cardiovascular: Denies chest pain. Respiratory: Denies shortness of breath. Gastrointestinal: No abdominal pain.  No nausea, no vomiting.  No diarrhea.  No constipation. Genitourinary: Negative for dysuria. Musculoskeletal: Negative for back pain. Skin: Negative for rash. Neurological: Negative for headaches, focal weakness or numbness.   ____________________________________________   PHYSICAL EXAM:  VITAL SIGNS: ED Triage Vitals [08/30/18 2119]  Enc Vitals Group     BP (!) 152/73     Pulse Rate 78     Resp 18     Temp 98 F (36.7 C)     Temp Source Oral     SpO2 100 %     Weight 130 lb (59 kg)     Height 5\' 2"  (1.575 m)     Head Circumference      Peak Flow      Pain Score 8     Pain Loc      Pain Edu?      Excl. in [place]?     Constitutional: Alert and oriented. Well appearing and in mild acute distress.  Grimaces with waves of pain. Eyes: Conjunctivae are normal. PERRL. EOMI. Head: Atraumatic. Ears: Right TM within normal limits.  Left mastoid not tender to palpation.  Left outer ear is sensitive to touch.  Left external canal within normal limits.  Left TM mildly erythematous with mild fluid. Nose: No congestion/rhinnorhea. Mouth/Throat: Mucous membranes are moist.  Oropharynx non-erythematous.  Left upper and lower teeth not tender to palpation with tongue blade.  No intra-or extraoral swelling.  TMJ not tender to palpation.  No trismus. Neck: No stridor.  Supple neck without meningismus. Cardiovascular: Normal rate, regular rhythm. Grossly normal heart sounds.  Good peripheral circulation. Respiratory: Normal respiratory effort.  No retractions. Lungs CTAB. Gastrointestinal: Soft and nontender. No distention. No abdominal  bruits. No CVA tenderness. Musculoskeletal: No lower extremity tenderness nor edema.  No joint effusions. Neurologic:  Normal speech and language. No gross focal neurologic deficits are appreciated. No gait instability. Skin:  Skin is warm, dry and intact. No rash noted.  No petechiae. Psychiatric: Mood and affect are normal. Speech and behavior are normal.  ____________________________________________   LABS (all labs ordered are listed, but only abnormal results are displayed)  Labs Reviewed  BASIC METABOLIC PANEL - Abnormal; Notable for the following components:      Result Value   Creatinine, Ser 0.37 (*)    All other components within normal limits  CBC WITH DIFFERENTIAL/PLATELET  SEDIMENTATION RATE   ____________________________________________  EKG  None ____________________________________________  RADIOLOGY  ED MD interpretation: Unremarkable CT temporal bone  Official radiology report(s):  Ct Temporal Bones Wo Contrast  Result Date: 08/31/2018 CLINICAL DATA:  57 y/o  F; left ear and jaw pain. EXAM: CT TEMPORAL BONES WITHOUT CONTRAST TECHNIQUE: Axial and coronal plane CT imaging of the petrous temporal bones was performed with thin-collimation image reconstruction. No intravenous contrast was administered. Multiplanar CT image reconstructions were also generated. COMPARISON:  None. FINDINGS: Right ear: Normal external auditory canal. Mild thickening of the tympanic membrane. No blunting of the scutum. The ossicles are intact without gross erosion. Normal course of the facial nerve. Normal semicircular canals and vestibulocochlear apparatus. Normally aerated mastoid air cells and middle ear. Left ear: Normal external auditory canal. Normal tympanic membrane. No blunting of the scutum. The ossicles are intact without gross erosion. Normal course of the facial nerve. Normal semicircular canals and vestibulocochlear apparatus. Normally aerated mastoid air cells and middle ear.  Other: Paranasal sinuses are normally aerated. No acute process identified in the intracranial compartment. Visualized orbits are unremarkable. 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 IMPRESSION: No acute process identified. Mild thickening of the right tympanic membrane. Otherwise normal CT of the temporal bones. Electronically Signed   By: Kristine Garbe M.D.   On: 08/31/2018 00:14    ____________________________________________   PROCEDURES  Procedure(s) performed: None  Procedures  Critical Care performed: No  ____________________________________________   INITIAL IMPRESSION / ASSESSMENT AND PLAN / ED COURSE  As part of my medical decision making, I reviewed the following data within the [place] History obtained from family, Nursing notes reviewed and incorporated, Labs reviewed, Radiograph reviewed and Notes from prior ED visits   57 year old female who presents with left ear/face pain.  Differential diagnosis includes but is not limited to otitis media, mastoiditis, TMJ, dental infection/abscess, trigeminal neuralgia, etc.  Will obtain basic lab work including sed rate, administer IV Toradol and Percocet for pain, IV Decadron for inflammation and reassess.  Clinical Course as of Aug 31 53  Thu Aug 31, 2018  0052 Patient improved after medicines.  Strict return precautions given.  Patient and sister verbalized understanding and agree with plan of care.   [JS]    Clinical Course User Index [JS] Paulette Blanch, MD     ____________________________________________   FINAL CLINICAL IMPRESSION(S) / ED DIAGNOSES  Final diagnoses:  Left ear pain  Left facial pressure and pain     ED Discharge Orders    None       Note:  This document was prepared using Dragon voice recognition software and may include unintentional dictation errors.    Paulette Blanch, MD 08/31/18 [phone]

## 2018-08-30 NOTE — ED Triage Notes (Signed)
Pt in with co left earache that has spread to left side of face and head. Went to walk in clinic was told it was not earache.

## 2018-08-30 NOTE — ED Notes (Signed)
Pt leaving for CT.  

## 2018-08-30 NOTE — ED Notes (Signed)
Pt c/o L jaw/ear/tooth pain. Denies CP, weakness outside of baseline, dizziness, HA.

## 2018-08-31 DIAGNOSIS — R6884 Jaw pain: Secondary | ICD-10-CM | POA: Diagnosis not present

## 2018-08-31 LAB — BASIC METABOLIC PANEL
Anion gap: 7 (ref 5–15)
BUN: 17 mg/dL (ref 6–20)
CHLORIDE: 104 mmol/L (ref 98–111)
CO2: 28 mmol/L (ref 22–32)
Calcium: 9.4 mg/dL (ref 8.9–10.3)
Creatinine, Ser: 0.37 mg/dL — ABNORMAL LOW (ref 0.44–1.00)
GLUCOSE: 91 mg/dL (ref 70–99)
Potassium: 3.9 mmol/L (ref 3.5–5.1)
Sodium: 139 mmol/L (ref 135–145)

## 2018-08-31 LAB — CBC WITH DIFFERENTIAL/PLATELET
Abs Immature Granulocytes: 0.01 10*3/uL (ref 0.00–0.07)
Basophils Absolute: 0.1 10*3/uL (ref 0.0–0.1)
Basophils Relative: 1 %
EOS ABS: 0.1 10*3/uL (ref 0.0–0.5)
EOS PCT: 2 %
HEMATOCRIT: 42.6 % (ref 36.0–46.0)
Hemoglobin: 13.9 g/dL (ref 12.0–15.0)
Immature Granulocytes: 0 %
LYMPHS PCT: 26 %
Lymphs Abs: 1.7 10*3/uL (ref 0.7–4.0)
MCH: 32.1 pg (ref 26.0–34.0)
MCHC: 32.6 g/dL (ref 30.0–36.0)
MCV: 98.4 fL (ref 80.0–100.0)
Monocytes Absolute: 0.6 10*3/uL (ref 0.1–1.0)
Monocytes Relative: 9 %
NEUTROS ABS: 4.2 10*3/uL (ref 1.7–7.7)
Neutrophils Relative %: 62 %
Platelets: 260 10*3/uL (ref 150–400)
RBC: 4.33 MIL/uL (ref 3.87–5.11)
RDW: 12.6 % (ref 11.5–15.5)
WBC: 6.6 10*3/uL (ref 4.0–10.5)
nRBC: 0 % (ref 0.0–0.2)

## 2018-08-31 LAB — SEDIMENTATION RATE: Sed Rate: 19 mm/hr (ref 0–30)

## 2018-08-31 MED ORDER — OXYCODONE-ACETAMINOPHEN 5-325 MG PO TABS: 1.0000 | ORAL_TABLET | ORAL | 0 refills | Status: DC | PRN

## 2018-08-31 MED ORDER — LIDOCAINE HCL (PF) 1 % IJ SOLN
2.0000 mL | Freq: Once | INTRAMUSCULAR | Status: AC
  Administered 2018-08-31: 2 mL
  Filled 2018-08-31: qty 5

## 2018-08-31 NOTE — Discharge Instructions (Signed)
Continue and finish steroid and antibiotic as directed by your doctor.  Please follow-up with the ENT specialist.  You may also take Percocet as needed for more severe pain.  Do not take this at the same time as tramadol.  Return to the ER for worsening symptoms, persistent vomiting, fever, lethargy, difficulty breathing or other concerns.

## 2018-09-04 ENCOUNTER — Telehealth: Payer: Self-pay | Admitting: Family Medicine

## 2018-09-04 NOTE — Telephone Encounter (Signed)
I called and left a message asking the pt to call and schedule AWV w/ [facility] McKenzie. VDM (DD)

## 2018-09-08 ENCOUNTER — Encounter: Payer: Self-pay | Admitting: Family Medicine

## 2018-09-08 ENCOUNTER — Ambulatory Visit (INDEPENDENT_AMBULATORY_CARE_PROVIDER_SITE_OTHER): Payer: PPO | Admitting: Family Medicine

## 2018-09-08 VITALS — BP 130/78 | HR 79 | Temp 98.3°F | Resp 16 | Wt 129.0 lb

## 2018-09-08 DIAGNOSIS — H9202 Otalgia, left ear: Secondary | ICD-10-CM | POA: Diagnosis not present

## 2018-09-08 MED ORDER — OXYCODONE-ACETAMINOPHEN 5-325 MG PO TABS: 1.0000 | ORAL_TABLET | Freq: Four times a day (QID) | ORAL | 0 refills | Status: DC | PRN

## 2018-09-08 NOTE — Progress Notes (Signed)
Patient: Tara Scott Female    DOB: 11-08-1960   57 y.o.   MRN: [id number] Visit Date: 09/08/2018  Today's Provider: Vernie Murders, PA   Chief Complaint  Patient presents with  . Hospitalization Follow-up   Subjective:     HPI    Follow up ER visit  Patient was seen in ER for left ear/facial pain on 08/30/2018. She was treated for left ear/facial pain. Treatment for this included labs and CT scan done. Given IV Toradol, Percocet, and Decadron. Also given rx for doxycycline.  She reports good compliance with treatment. She reports this condition is Unchanged.  ---------------------------------------------------------------  Patient states she has completed medications given to her at ER visit. Patient states she is still in severe pain. Patient has scheduled an appointment with a Dentist however she cannot be seen until September 27, 2018.   Past Medical History:  Diagnosis Date  . Anxiety   . 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 DVT (deep venous thrombosis) ([place])   . Malignant hyperthermia   . Toxic multinodular goiter    Past Surgical History:  Procedure Laterality Date  . ADENOIDECTOMY    . CESAREAN SECTION    . COLONOSCOPY WITH PROPOFOL N/A 03/08/2016   Procedure: COLONOSCOPY WITH PROPOFOL;  Surgeon: Manya Silvas, MD;  Location: [facility] ENDOSCOPY;  Service: Endoscopy;  Laterality: N/A;  . LAPAROTOMY  1984   OVARIAN CYST   Family History  Problem Relation Age of Onset  . CVA Mother   . Hypercholesterolemia Mother   . Depression Mother   . Rheum arthritis Mother   . Deep vein thrombosis Mother   . Bladder Cancer Father   . Diverticulitis Father   . Pulmonary embolism Father   . Lung cancer Father   . Deep vein thrombosis Sister   . Colon polyps Sister   . Atrial fibrillation Sister   . Muscular dystrophy Sister   . Diabetes Son        type 2   Allergies  Allergen Reactions  . Anesthetics,  Halogenated Other (See Comments)    Other reaction(s): Other (See Comments)  . Ciprofloxacin     swelling  . Methimazole     hives  . Penicillins Other (See Comments)    RXN AS A CHILD    Current Outpatient Medications:  .  ALPRAZolam (XANAX) 0.5 MG tablet, TAKE 1 TABLET BY MOUTH EVERY DAY AS NEEDED, Disp: 30 tablet, Rfl: 5 .  citalopram (CELEXA) 40 MG tablet, TAKE 1 TABLET BY MOUTH EVERY DAY FOR DEPRESSION, Disp: 90 tablet, Rfl: 1 .  levothyroxine (SYNTHROID, LEVOTHROID) 50 MCG tablet, Take by mouth., Disp: , Rfl:  .  ondansetron (ZOFRAN) 4 MG tablet, Take 1 tablet (4 mg total) by mouth every 8 (eight) hours as needed for nausea or vomiting. (Patient not taking: Reported on 09/08/2018), Disp: 20 tablet, Rfl: 0 .  oxyCODONE-acetaminophen (PERCOCET/ROXICET) 5-325 MG tablet, Take 1 tablet by mouth every 4 (four) hours as needed for severe pain. (Patient not taking: Reported on 09/08/2018), Disp: 20 tablet, Rfl: 0 .  traZODone (DESYREL) 50 MG tablet, TAKE 1/2 TO 1 TABLET BY MOUTH AT BEDTIME AS NEEDED FOR SLEEP (Patient not taking: Reported on 09/08/2018), Disp: 90 tablet, Rfl: 1 .  Vitamin D, Ergocalciferol, (DRISDOL) 50000 units CAPS capsule, TAKE 1 CAPSULE (50,000 UNITS TOTAL) BY MOUTH EVERY 7 (SEVEN) DAYS. (Patient not taking: Reported on 09/08/2018), Disp: 12  capsule, Rfl: 1  Review of Systems  Constitutional: Negative for appetite change, chills, fatigue and fever.  Respiratory: Negative for chest tightness and shortness of breath.   Cardiovascular: Negative for chest pain and palpitations.  Gastrointestinal: Negative for abdominal pain, nausea and vomiting.  Neurological: Negative for dizziness and weakness.   Social History   Tobacco Use  . Smoking status: Current Every Day Smoker    Packs/day: 0.25    Years: 30.00    Pack years: 7.50    Types: Cigarettes  . Smokeless tobacco: Never Used  Substance Use Topics  . Alcohol use: No    Alcohol/week: 0.0 standard drinks       Objective:   BP 130/78 (BP Location: Left Arm, Patient Position: Sitting, Cuff Size: Normal)   Pulse 79   Temp 98.3 F (36.8 C) (Oral)   Resp 16   Wt 129 lb (58.5 kg)   SpO2 95%   BMI 23.59 kg/m  Vitals:   09/08/18 1421  BP: 130/78  Pulse: 79  Resp: 16  Temp: 98.3 F (36.8 C)  TempSrc: Oral  SpO2: 95%  Weight: 129 lb (58.5 kg)   Physical Exam Constitutional:      General: She is not in acute distress.    Appearance: She is well-developed.  HENT:     Head: Normocephalic and atraumatic.     Comments: Severe pain in front of the left ear radiating along the jaw. No click or locking of TMJ. Some tenderness and swollen sensation without actual edema. No hearing changes or drainage from left ear.     Right Ear: Hearing, tympanic membrane and ear canal normal.     Left Ear: Hearing, tympanic membrane and ear canal normal.     Nose: Nose normal.  Eyes:     General: Lids are normal. No scleral icterus.       Right eye: No discharge.        Left eye: No discharge.     Conjunctiva/sclera: Conjunctivae normal.  Pulmonary:     Effort: Pulmonary effort is normal. No respiratory distress.  Musculoskeletal: Normal range of motion.  Skin:    Findings: No lesion or rash.  Neurological:     Mental Status: She is alert and oriented to person, place, and time.  Psychiatric:        Speech: Speech normal.        Behavior: Behavior normal.        Thought Content: Thought content normal.       Assessment & Plan    1. Left ear pain Onset 08-26-18 without any injury. Went to [facility] and was given Tramadol without relief on 08-30-18. Pain intensified and she went to the ER. CT scan was negative for ear infection or mastoiditis. Was given Percocet which helped take the edge off the severe sharp pain. Continues to have intense and persistent pain return when Percocet wears off. Scheduled to see a dentis to evaluate for abscess or TMJ syndrome. Refilled the Percocet and she will  contact dentist Monday. May apply moist heat or ice packs prn. Symptoms seem similar to Trigeminal Neuralgia but the CT scan showed normal facial nerves. May need referral to a neurologist and trial of Neurontin. - oxyCODONE-acetaminophen (PERCOCET/ROXICET) 5-325 MG tablet; Take 1 tablet by mouth every 6 (six) hours as needed for severe pain.  Dispense: 30 tablet; Refill: [place], PA  [facility]

## 2018-09-18 ENCOUNTER — Telehealth: Payer: Self-pay

## 2018-09-18 NOTE — Telephone Encounter (Signed)
Patient advised. She states the medication was Trazodone.

## 2018-09-18 NOTE — Telephone Encounter (Signed)
Patient reports that she accidentally took one of her "sleeping pills' instead of her thyroid medication earlier today. She is wanting to know if she can take her pain medication tonight? Or does she have to wait? Please advise. Contact info is correct. Thanks!

## 2018-09-18 NOTE — Telephone Encounter (Signed)
By sleeping pill I assume she needs trazodone.  If that is the case, it is fine for her to take this with pain medication later in the day.

## 2018-09-21 ENCOUNTER — Encounter: Payer: Self-pay | Admitting: Physician Assistant

## 2018-09-21 ENCOUNTER — Ambulatory Visit (INDEPENDENT_AMBULATORY_CARE_PROVIDER_SITE_OTHER): Payer: PPO | Admitting: Physician Assistant

## 2018-09-21 VITALS — BP 136/73 | HR 61 | Temp 98.1°F | Resp 16 | Wt 131.0 lb

## 2018-09-21 DIAGNOSIS — H9202 Otalgia, left ear: Secondary | ICD-10-CM

## 2018-09-21 DIAGNOSIS — R51 Headache: Secondary | ICD-10-CM

## 2018-09-21 DIAGNOSIS — L0291 Cutaneous abscess, unspecified: Secondary | ICD-10-CM | POA: Diagnosis not present

## 2018-09-21 DIAGNOSIS — R6884 Jaw pain: Secondary | ICD-10-CM

## 2018-09-21 DIAGNOSIS — R519 Headache, unspecified: Secondary | ICD-10-CM

## 2018-09-21 MED ORDER — OXYCODONE-ACETAMINOPHEN 5-325 MG PO TABS: 1.0000 | ORAL_TABLET | Freq: Four times a day (QID) | ORAL | 0 refills | Status: DC | PRN

## 2018-09-21 NOTE — Patient Instructions (Signed)

## 2018-09-21 NOTE — Progress Notes (Signed)
Patient: Tara Scott Female    DOB: 1960-10-22   58 y.o.   MRN: [id number] Visit Date: 09/29/2018  Today's Provider: Trinna Post, PA-C   Chief Complaint  Patient presents with  . Facial Pain  . Rash   Subjective:     HPI  Patient here today c/o recurrent persistent ear pain and facial pain. She had episode of left sided facial pain and was seen in the ED on 08/30/2018. CT temporal bones showed mild thickening of right tympanic membranes but otherwise normal. She was treated with pain medicine with improvement. She describes pain as sometimes on her left, sometimes on her right side of her face. She describes the pain as severe and constant. Denies headache, vision loss. Patient reports that pain is on her right side and reports that pain medication helps some with pain. Patient reports that she has an appt with dentist on 09/27/18 as Vernie Murders, PA-C advised. She was seen in this clinic for the same issue on 09/08/2018. Patient reports that she would like to be seen by them before going to neurology.   Patient also c/o lesion on side of vaginal area x's 6 days. Patient reports the lesion was very painful and swollen. Patient reports that she did have some discharge from the lesion and its not as painful since then. Denies new sexual partners.    Allergies  Allergen Reactions  . Anesthetics, Halogenated Other (See Comments)    Other reaction(s): Other (See Comments)  . Ciprofloxacin     swelling  . Methimazole     hives  . Penicillins Other (See Comments)    RXN AS A CHILD     Current Outpatient Medications:  .  ALPRAZolam (XANAX) 0.5 MG tablet, TAKE 1 TABLET BY MOUTH EVERY DAY AS NEEDED, Disp: 30 tablet, Rfl: 5 .  citalopram (CELEXA) 40 MG tablet, TAKE 1 TABLET BY MOUTH EVERY DAY FOR DEPRESSION, Disp: 90 tablet, Rfl: 1 .  levothyroxine (SYNTHROID, LEVOTHROID) 50 MCG tablet, Take by mouth., Disp: , Rfl:  .  oxyCODONE-acetaminophen (PERCOCET/ROXICET) 5-325 MG  tablet, Take 1 tablet by mouth every 6 (six) hours as needed for severe pain., Disp: 20 tablet, Rfl: 0  Review of Systems  Constitutional: Negative.   HENT: Positive for ear pain.   Respiratory: Negative.   Cardiovascular: Negative.   Skin: Positive for rash.    Social History   Tobacco Use  . Smoking status: Current Every Day Smoker    Packs/day: 0.25    Years: 30.00    Pack years: 7.50    Types: Cigarettes  . Smokeless tobacco: Never Used  Substance Use Topics  . Alcohol use: No    Alcohol/week: 0.0 standard drinks      Objective:   BP 136/73 (BP Location: Left Arm, Patient Position: Sitting, Cuff Size: Normal)   Pulse 61   Temp 98.1 F (36.7 C) (Oral)   Resp 16   Wt 131 lb (59.4 kg)   BMI 23.96 kg/m  Vitals:   09/21/18 1122  BP: 136/73  Pulse: 61  Resp: 16  Temp: 98.1 F (36.7 C)  TempSrc: Oral  Weight: 131 lb (59.4 kg)     Physical Exam Constitutional:      Appearance: Normal appearance.  Cardiovascular:     Rate and Rhythm: Normal rate and regular rhythm.     Heart sounds: Normal heart sounds.  Genitourinary:   Skin:    General: Skin is warm and dry.  Neurological:     General: No focal deficit present.     Mental Status: She is alert and oriented to person, place, and time. Mental status is at baseline.     Cranial Nerves: No cranial nerve deficit.     Motor: Weakness present.     Comments: Baseline weakness from muscular dystrophy.   Psychiatric:        Mood and Affect: Mood normal.        Behavior: Behavior normal.         Assessment & Plan    1. Abscess  Healing abscess on left labia majora. Does not need any intervention. If cyst, make reoccur and may need referral to OBGYN for removal.   2. Jaw pain  Unclear etiology. She still describes extremely painful episodes of pain. Could be trigeminal neuralgia, CT scan was negative in ER. I have reviewed the CT scan personally and agree with findings. She has an upcoming dentist  appointment but we discussed referral to neurology as this may take some days to get in. She is agreeable to this. Do not intend to refill pain medication. Have reviewed [place].   3. Facial pain  - Ambulatory referral to Neurology  4. Left ear pain  - oxyCODONE-acetaminophen (PERCOCET/ROXICET) 5-325 MG tablet; Take 1 tablet by mouth every 6 (six) hours as needed for severe pain.  Dispense: 20 tablet; Refill: 0  Return if symptoms worsen or fail to improve.  The entirety of the information documented in the History of Present Illness, Review of Systems and Physical Exam were personally obtained by me. Portions of this information were initially documented by Lynford Humphrey, CMA and reviewed by me for thoroughness and accuracy.   I have spent 25 minutes with this patient, >50% of which was spent on counseling and coordination of care.      Trinna Post, PA-C  [facility]

## 2018-10-09 ENCOUNTER — Ambulatory Visit (INDEPENDENT_AMBULATORY_CARE_PROVIDER_SITE_OTHER): Payer: PPO | Admitting: Physician Assistant

## 2018-10-09 DIAGNOSIS — Z23 Encounter for immunization: Secondary | ICD-10-CM

## 2018-10-09 DIAGNOSIS — R6884 Jaw pain: Secondary | ICD-10-CM | POA: Diagnosis not present

## 2018-10-09 DIAGNOSIS — E890 Postprocedural hypothyroidism: Secondary | ICD-10-CM | POA: Diagnosis not present

## 2018-11-14 ENCOUNTER — Telehealth: Payer: Self-pay | Admitting: Family Medicine

## 2018-11-14 NOTE — Telephone Encounter (Signed)
If she needs a root canal, this is a very specific infection deep inside of her teeth due to years of decay and disease. This is different from the type infection like an abscess. Her specific infection would not be helped by antibiotics that we usually give. It likely needs dental intervention. She can of course have an appointment so we can assess for abscesses and need for antibiotics, but she needs a dentist. If she is having trouble affording the procedure, she might try contacting [employer] to see if she can get the procedure there at a lower cost in the student run clinic.

## 2018-11-14 NOTE — Telephone Encounter (Signed)
Pt is needing something to get rid of infection in gums and dentist is telling her is needing a root canal.  However, pt doesn't have dental insurance to have this done or can afford the treatment. Pt is in a lot of pain and not able to eat.   [id number] - [place], [place] - [address] [phone] (Phone) [phone] (Fax)   Please advise.  Thanks, [employer]

## 2018-11-15 NOTE — Telephone Encounter (Signed)
Agree with Adriana.

## 2018-11-15 NOTE — Telephone Encounter (Signed)
Patient advised. She has a appointment on Tuesday for a extraction.

## 2018-12-07 ENCOUNTER — Other Ambulatory Visit: Payer: Self-pay | Admitting: Physician Assistant

## 2018-12-07 DIAGNOSIS — F419 Anxiety disorder, unspecified: Secondary | ICD-10-CM

## 2018-12-14 DIAGNOSIS — E890 Postprocedural hypothyroidism: Secondary | ICD-10-CM | POA: Diagnosis not present

## 2019-01-15 ENCOUNTER — Encounter: Payer: Self-pay | Admitting: Family Medicine

## 2019-01-15 ENCOUNTER — Other Ambulatory Visit: Payer: Self-pay

## 2019-01-15 ENCOUNTER — Ambulatory Visit (INDEPENDENT_AMBULATORY_CARE_PROVIDER_SITE_OTHER): Payer: PPO | Admitting: Family Medicine

## 2019-01-15 VITALS — BP 124/72 | HR 70 | Temp 99.6°F | Wt 132.0 lb

## 2019-01-15 DIAGNOSIS — K047 Periapical abscess without sinus: Secondary | ICD-10-CM

## 2019-01-15 MED ORDER — OXYCODONE-ACETAMINOPHEN 5-325 MG PO TABS: 1.0000 | ORAL_TABLET | Freq: Four times a day (QID) | ORAL | 0 refills | Status: DC | PRN

## 2019-01-15 MED ORDER — CLINDAMYCIN HCL 300 MG PO CAPS: 300.0000 mg | ORAL_CAPSULE | Freq: Three times a day (TID) | ORAL | 0 refills | Status: DC

## 2019-01-15 NOTE — Progress Notes (Signed)
Tara Scott  MRN: [id number] DOB: 1961/01/03  Subjective:  HPI   The patient is a 58 year old female who presents for evaluation of jaw pain.  The patient states that since December she has been having bilateral jaw pain.  She was seen by a dentist in early March and told that she needed to have a root canal on each side. She has not been able to get this done due to the COVID 19 closures. The patient states she has been tolerating the pain until last night.  Her right side became significantly more painfrul and today her right jaw is extremely swollen.    Patient Active Problem List   Diagnosis Date Noted  . Grief reaction with prolonged bereavement 04/28/2018  . Difficulty sleeping 04/28/2018  . Benign essential tremor 12/26/2015  . Sinusitis 03/25/2015  . Anxiety 03/14/2015  . 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 Dizziness 03/14/2015  . MD (muscular dystrophy) ([place]) 03/14/2015  . Hyperthyroidism 03/14/2015  . Arthralgia of hip or thigh 03/15/2012  . Adverse effect of malignant hyperthermia 04/16/2010  . 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 Avitaminosis D 04/16/2010  . Goiter, nontoxic, multinodular 11/04/2009  . Absence of menstruation 08/18/2009  . Acid reflux 12/16/2008  . Cannot sleep 11/17/2008  . Adnexal pain 10/04/2007  . Depression, neurotic 09/01/2007   Past Medical History:  Diagnosis Date  . Anxiety   . 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 DVT (deep venous thrombosis) ([place])   . Malignant hyperthermia   . Toxic multinodular goiter    Past Surgical History:  Procedure Laterality Date  . ADENOIDECTOMY    . CESAREAN SECTION    . COLONOSCOPY WITH PROPOFOL N/A 03/08/2016   Procedure: COLONOSCOPY WITH PROPOFOL;  Surgeon: Manya Silvas, MD;  Location: [facility] ENDOSCOPY;  Service: Endoscopy;  Laterality: N/A;  . LAPAROTOMY  1984   OVARIAN CYST   Family History  Problem Relation Age  of Onset  . CVA Mother   . Hypercholesterolemia Mother   . Depression Mother   . Rheum arthritis Mother   . Deep vein thrombosis Mother   . Bladder Cancer Father   . Diverticulitis Father   . Pulmonary embolism Father   . Lung cancer Father   . Deep vein thrombosis Sister   . Colon polyps Sister   . Atrial fibrillation Sister   . Muscular dystrophy Sister   . Diabetes Son        type 2   Social History   Socioeconomic History  . Marital status: Widowed    Spouse name: Not on file  . Number of children: Not on file  . Years of education: Not on file  . Highest education level: Not on file  Occupational History  . Not on file  Social Needs  . Financial resource strain: Not on file  . Food insecurity:    Worry: Not on file    Inability: Not on file  . Transportation needs:    Medical: Not on file    Non-medical: Not on file  Tobacco Use  . Smoking status: Current Every Day Smoker    Packs/day: 0.25    Years: 30.00    Pack years: 7.50    Types: Cigarettes  . Smokeless tobacco: Never Used  Substance and Sexual Activity  . Alcohol use: No    Alcohol/week: 0.0 standard drinks  . Drug use: No  .  Sexual activity: Not on file  Lifestyle  . Physical activity:    Days per week: Not on file    Minutes per session: Not on file  . Stress: Not on file  Relationships  . Social connections:    Talks on phone: Not on file    Gets together: Not on file    Attends religious service: Not on file    Active member of club or organization: Not on file    Attends meetings of clubs or organizations: Not on file    Relationship status: Not on file  . Intimate partner violence:    Fear of current or ex partner: Not on file    Emotionally abused: Not on file    Physically abused: Not on file    Forced sexual activity: Not on file  Other Topics Concern  . Not on file  Social History Narrative  . Not on file    Outpatient Encounter Medications as of 01/15/2019  Medication Sig   . ALPRAZolam (XANAX) 0.5 MG tablet TAKE 1 TABLET BY MOUTH EVERY DAY AS NEEDED  . citalopram (CELEXA) 40 MG tablet TAKE 1 TABLET BY MOUTH EVERY DAY FOR DEPRESSION  . Levothyroxine Sodium 75 MCG CAPS Take 75 mcg by mouth. Patient takes 75 mcg daily except on Wednesday she takes 172mcg.  . [DISCONTINUED] oxyCODONE-acetaminophen (PERCOCET/ROXICET) 5-325 MG tablet Take 1 tablet by mouth every 6 (six) hours as needed for severe pain.   No facility-administered encounter medications on file as of 01/15/2019.    Allergies  Allergen Reactions  . Anesthetics, Halogenated Other (See Comments)    Other reaction(s): Other (See Comments)  . Ciprofloxacin     swelling  . Methimazole     hives  . Penicillins Other (See Comments)    RXN AS A CHILD   Review of Systems  Constitutional: Negative for chills and fever.  HENT: Negative for congestion, ear pain, sinus pain, sore throat and tinnitus.     Objective:  BP 124/72 (BP Location: Right Arm, Patient Position: Sitting, Cuff Size: Normal)   Pulse 70   Temp 99.6 F (37.6 C) (Oral)   Wt 132 lb (59.9 kg)   SpO2 99%   BMI 24.14 kg/m   Physical Exam  Constitutional: She is oriented to person, place, and time and well-developed, well-nourished, and in no distress.  HENT:  Head: Normocephalic.  Very swollen right lower jaw and painful to palpate. Unable to open mouth much due to pain.  Eyes: Conjunctivae are normal.  Neck: Neck supple.  Pulmonary/Chest: Effort normal.  Abdominal: Soft.  Musculoskeletal: Normal range of motion.  Neurological: She is alert and oriented to person, place, and time.  Skin: No rash noted.  Psychiatric: Mood, affect and judgment normal.    Assessment and Plan :   1. Tooth abscess Has had intermittent pain in the right lower jaw since December 2019. Has been seen in [facility] and [facility] 08-30-18. Was evaluated her 09-21-18 with treatment for an abscess and improved for a while. Has had dental evaluation and  tried to schedule oral surgeon referral for root canal procedures. Had another recurrence last night with progressive swelling in the right lower jaw. Will treat with Clindamycin and Percocet (no pain relief from Tylenol now and difficulty eating). Rinse with warm saltwater and apply warm compresses externally. She will contact dentist again to see if they can contact her oral surgeon. Advised to the ER if fever goes over 100.5 and swelling worsens after  getting on the antibiotic. - clindamycin (CLEOCIN) 300 MG capsule; Take 1 capsule (300 mg total) by mouth 3 (three) times daily.  Dispense: 21 capsule; Refill: 0 - oxyCODONE-acetaminophen (PERCOCET/ROXICET) 5-325 MG tablet; Take 1 tablet by mouth every 6 (six) hours as needed for severe pain.  Dispense: 20 tablet; Refill: 0

## 2019-02-14 ENCOUNTER — Ambulatory Visit (INDEPENDENT_AMBULATORY_CARE_PROVIDER_SITE_OTHER): Payer: PPO | Admitting: Family Medicine

## 2019-02-14 DIAGNOSIS — K0889 Other specified disorders of teeth and supporting structures: Secondary | ICD-10-CM

## 2019-02-14 DIAGNOSIS — K047 Periapical abscess without sinus: Secondary | ICD-10-CM | POA: Diagnosis not present

## 2019-02-14 MED ORDER — OXYCODONE-ACETAMINOPHEN 5-325 MG PO TABS: 1.0000 | ORAL_TABLET | Freq: Four times a day (QID) | ORAL | 0 refills | Status: DC | PRN

## 2019-02-14 MED ORDER — CLINDAMYCIN HCL 300 MG PO CAPS: 300.0000 mg | ORAL_CAPSULE | Freq: Three times a day (TID) | ORAL | 0 refills | Status: DC

## 2019-02-14 NOTE — Progress Notes (Signed)
Patient: Tara Scott Female    DOB: [birth year]   58 y.o.   MRN: [id number] Visit Date: 02/15/2019  Today's Provider: Lavon Paganini, MD   Chief Complaint  Patient presents with  . Dental Pain   Subjective:    I, Porsha McClurkin CMA, am acting as a scribe for Lavon Paganini, MD.   Virtual Visit via Telephone Note  I connected with Aleda Grana on 02/15/19 at  1:40 PM EDT by telephone and verified that I am speaking with the correct person using two identifiers.  Patient location: home Provider location: [place] involved in the visit: patient, provider   I discussed the limitations, risks, security and privacy concerns of performing an evaluation and management service by telephone and the availability of in person appointments. I also discussed with the patient that there may be a patient responsible charge related to this service. The patient expressed understanding and agreed to proceed.    Dental Pain   This is a recurrent problem. The current episode started yesterday. The problem occurs constantly. The problem has been gradually worsening. The pain is at a severity of 8/10. The pain is severe. Pertinent negatives include no facial pain, oral bleeding or sinus pressure. She has tried acetaminophen for the symptoms. The treatment provided no relief.   On Left side now Tooth needs to be pulled Unable to find a dentist due to [place] closures. Was see nfor dental abscess 01/15/2019 on R side which is better, but now on L side lower jaw    Allergies  Allergen Reactions  . Anesthetics, Halogenated Other (See Comments)    Other reaction(s): Other (See Comments)  . Ciprofloxacin     swelling  . Methimazole     hives  . Penicillins Other (See Comments)    RXN AS A CHILD     Current Outpatient Medications:  .  ALPRAZolam (XANAX) 0.5 MG tablet, TAKE 1 TABLET BY MOUTH EVERY DAY AS NEEDED, Disp: 30 tablet, Rfl: 5 .  citalopram (CELEXA)  40 MG tablet, TAKE 1 TABLET BY MOUTH EVERY DAY FOR DEPRESSION, Disp: 90 tablet, Rfl: 1 .  clindamycin (CLEOCIN) 300 MG capsule, Take 1 capsule (300 mg total) by mouth 3 (three) times daily., Disp: 21 capsule, Rfl: 0 .  Levothyroxine Sodium 75 MCG CAPS, Take 75 mcg by mouth. Patient takes 75 mcg daily except on Wednesday she takes 161mcg., Disp: , Rfl:  .  oxyCODONE-acetaminophen (PERCOCET/ROXICET) 5-325 MG tablet, Take 1 tablet by mouth every 6 (six) hours as needed for severe pain., Disp: 20 tablet, Rfl: 0  Review of Systems  HENT: Negative for sinus pressure.     Social History   Tobacco Use  . Smoking status: Current Every Day Smoker    Packs/day: 0.25    Years: 30.00    Pack years: 7.50    Types: Cigarettes  . Smokeless tobacco: Never Used  Substance Use Topics  . Alcohol use: No    Alcohol/week: 0.0 standard drinks      Objective:   There were no vitals taken for this visit. There were no vitals filed for this visit.   Physical Exam Constitutional:      Comments: tearful  Pulmonary:     Effort: Pulmonary effort is normal. No respiratory distress.         Assessment & Plan   Follow Up Instructions: I discussed the assessment and treatment plan with the patient. The patient was provided an opportunity to  ask questions and all were answered. The patient agreed with the plan and demonstrated an understanding of the instructions.   The patient was advised to call back or seek an in-person evaluation if the symptoms worsen or if the condition fails to improve as anticipated.  1. Pain, dental 2. Tooth abscess - concern for poissible dental abscess - has poor dentition and was already told that she needed a root canal and a tooth pulled - will treat with Clindamycin, give small amount of Percocet for pain control, and refer to a dentist as she has had difficulty finding a dentist herself - clindamycin (CLEOCIN) 300 MG capsule; Take 1 capsule (300 mg total) by mouth 3  (three) times daily.  Dispense: 21 capsule; Refill: 0 - oxyCODONE-acetaminophen (PERCOCET/ROXICET) 5-325 MG tablet; Take 1 tablet by mouth every 6 (six) hours as needed for severe pain.  Dispense: 20 tablet; Refill: 0 - Ambulatory referral to Dentistry    Meds ordered this encounter  Medications  . clindamycin (CLEOCIN) 300 MG capsule    Sig: Take 1 capsule (300 mg total) by mouth 3 (three) times daily.    Dispense:  21 capsule    Refill:  0  . oxyCODONE-acetaminophen (PERCOCET/ROXICET) 5-325 MG tablet    Sig: Take 1 tablet by mouth every 6 (six) hours as needed for severe pain.    Dispense:  20 tablet    Refill:  0     Return if symptoms worsen or fail to improve.   The entirety of the information documented in the History of Present Illness, Review of Systems and Physical Exam were personally obtained by me. Portions of this information were initially documented by [facility], CMA and reviewed by me for thoroughness and accuracy.    Jacoba Cherney, Dionne Bucy, MD MPH [facility]

## 2019-02-28 DIAGNOSIS — H40039 Anatomical narrow angle, unspecified eye: Secondary | ICD-10-CM | POA: Diagnosis not present

## 2019-03-27 DIAGNOSIS — H40033 Anatomical narrow angle, bilateral: Secondary | ICD-10-CM | POA: Diagnosis not present

## 2019-04-02 ENCOUNTER — Other Ambulatory Visit: Payer: Self-pay | Admitting: Physician Assistant

## 2019-04-02 DIAGNOSIS — F419 Anxiety disorder, unspecified: Secondary | ICD-10-CM

## 2019-04-10 DIAGNOSIS — E890 Postprocedural hypothyroidism: Secondary | ICD-10-CM | POA: Diagnosis not present

## 2019-04-16 DIAGNOSIS — E890 Postprocedural hypothyroidism: Secondary | ICD-10-CM | POA: Diagnosis not present

## 2019-04-16 DIAGNOSIS — F17200 Nicotine dependence, unspecified, uncomplicated: Secondary | ICD-10-CM | POA: Diagnosis not present

## 2019-06-05 DIAGNOSIS — H40039 Anatomical narrow angle, unspecified eye: Secondary | ICD-10-CM | POA: Diagnosis not present

## 2019-07-03 ENCOUNTER — Ambulatory Visit: Payer: PPO | Admitting: Family Medicine

## 2019-07-03 NOTE — Progress Notes (Deleted)
       Patient: Tara Scott Female    DOB: 04-01-1961   58 y.o.   MRN: [id number] Visit Date: 07/03/2019  Today's Provider: Lavon Paganini, MD   No chief complaint on file.  Subjective:     Back Pain    Allergies  Allergen Reactions  . Anesthetics, Halogenated Other (See Comments)    Other reaction(s): Other (See Comments)  . Ciprofloxacin     swelling  . Methimazole     hives  . Penicillins Other (See Comments)    RXN AS A CHILD     Current Outpatient Medications:  .  ALPRAZolam (XANAX) 0.5 MG tablet, TAKE 1 TABLET BY MOUTH EVERY DAY AS NEEDED, Disp: 30 tablet, Rfl: 5 .  citalopram (CELEXA) 40 MG tablet, TAKE 1 TABLET BY MOUTH EVERY DAY FOR DEPRESSION, Disp: 90 tablet, Rfl: 1 .  clindamycin (CLEOCIN) 300 MG capsule, Take 1 capsule (300 mg total) by mouth 3 (three) times daily., Disp: 21 capsule, Rfl: 0 .  Levothyroxine Sodium 75 MCG CAPS, Take 75 mcg by mouth. Patient takes 75 mcg daily except on Wednesday she takes 111mcg., Disp: , Rfl:  .  oxyCODONE-acetaminophen (PERCOCET/ROXICET) 5-325 MG tablet, Take 1 tablet by mouth every 6 (six) hours as needed for severe pain., Disp: 20 tablet, Rfl: 0  Review of Systems  Constitutional: Negative.   Respiratory: Negative.   Cardiovascular: Negative.   Musculoskeletal: Positive for back pain.    Social History   Tobacco Use  . Smoking status: Current Every Day Smoker    Packs/day: 0.25    Years: 30.00    Pack years: 7.50    Types: Cigarettes  . Smokeless tobacco: Never Used  Substance Use Topics  . Alcohol use: No    Alcohol/week: 0.0 standard drinks      Objective:   There were no vitals taken for this visit. There were no vitals filed for this visit.There is no height or weight on file to calculate BMI.   Physical Exam   No results found for any visits on 07/03/19.     Assessment & Plan        Lavon Paganini, MD  [facility]

## 2019-07-04 ENCOUNTER — Ambulatory Visit: Payer: PPO | Admitting: Physician Assistant

## 2019-07-11 ENCOUNTER — Telehealth: Payer: Self-pay | Admitting: Family Medicine

## 2019-07-11 NOTE — Chronic Care Management (AMB) (Signed)
Chronic Care Management   Note  07/11/2019 Name: Tara Scott MRN: [id number] DOB: Nov 17, 1960  Tara Scott is a 58 y.o. year old female who is a primary care patient of Brita Romp, Dionne Bucy, MD. I reached out to Aleda Grana by phone today in response to a referral sent by Tara Scott Jia's health plan.     Ms. Ingman was given information about Chronic Care Management services today including:  1. CCM service includes personalized support from designated clinical staff supervised by her physician, including individualized plan of care and coordination with other care providers 2. 24/7 contact phone numbers for assistance for urgent and routine care needs. 3. Service will only be billed when office clinical staff spend 20 minutes or more in a month to coordinate care. 4. Only one practitioner may furnish and bill the service in a calendar month. 5. The patient may stop CCM services at any time (effective at the end of the month) by phone call to the office staff. 6. The patient will be responsible for cost sharing (co-pay) of up to 20% of the service fee (after annual deductible is met).  Patient agreed to services and verbal consent obtained.   Follow up plan: Telephone appointment with CCM team member scheduled for: 07/20/2019  [place]  ??[email or web address]   ??[phone]

## 2019-07-20 ENCOUNTER — Telehealth: Payer: PPO | Admitting: *Deleted

## 2019-09-04 ENCOUNTER — Other Ambulatory Visit: Payer: Self-pay

## 2019-09-04 ENCOUNTER — Encounter: Payer: Self-pay | Admitting: Family Medicine

## 2019-09-04 ENCOUNTER — Ambulatory Visit (INDEPENDENT_AMBULATORY_CARE_PROVIDER_SITE_OTHER): Payer: PPO | Admitting: Family Medicine

## 2019-09-04 VITALS — BP 159/75 | HR 66 | Temp 97.2°F | Resp 16 | Wt 133.4 lb

## 2019-09-04 DIAGNOSIS — Z23 Encounter for immunization: Secondary | ICD-10-CM | POA: Diagnosis not present

## 2019-09-04 DIAGNOSIS — M25551 Pain in right hip: Secondary | ICD-10-CM | POA: Diagnosis not present

## 2019-09-04 DIAGNOSIS — F4321 Adjustment disorder with depressed mood: Secondary | ICD-10-CM | POA: Diagnosis not present

## 2019-09-04 DIAGNOSIS — M7989 Other specified soft tissue disorders: Secondary | ICD-10-CM

## 2019-09-04 DIAGNOSIS — Z801 Family history of malignant neoplasm of trachea, bronchus and lung: Secondary | ICD-10-CM | POA: Diagnosis not present

## 2019-09-04 DIAGNOSIS — G7100 Muscular dystrophy, unspecified: Secondary | ICD-10-CM | POA: Diagnosis not present

## 2019-09-04 DIAGNOSIS — F419 Anxiety disorder, unspecified: Secondary | ICD-10-CM | POA: Diagnosis not present

## 2019-09-04 DIAGNOSIS — Z1231 Encounter for screening mammogram for malignant neoplasm of breast: Secondary | ICD-10-CM | POA: Diagnosis not present

## 2019-09-04 DIAGNOSIS — E890 Postprocedural hypothyroidism: Secondary | ICD-10-CM

## 2019-09-04 DIAGNOSIS — Z122 Encounter for screening for malignant neoplasm of respiratory organs: Secondary | ICD-10-CM | POA: Diagnosis not present

## 2019-09-04 DIAGNOSIS — F4381 Prolonged grief disorder: Secondary | ICD-10-CM

## 2019-09-04 DIAGNOSIS — F17200 Nicotine dependence, unspecified, uncomplicated: Secondary | ICD-10-CM | POA: Diagnosis not present

## 2019-09-04 NOTE — Progress Notes (Signed)
Patient: Tara Scott Female    DOB: 1960/11/25   58 y.o.   MRN: [id number] Visit Date: 09/04/2019  Today's Provider: Lavon Paganini, MD   Chief Complaint  Patient presents with  . Thyroid Problem   Subjective:     HPI Patient here today with c/o swelling under her arm and breast, right side. Her twin sister was diagnosed with lung cancer and she is concerned about possibly having cancer herself. Last mammogram was 01/13/2011.  She hasnt noticed any discrete lumps. No night sweats, fevers, lymphadenopathy.  She also reports that Dr.Solum told her she can start having her thryoid labs check by her PCP.  She is taking Levothyroxine 61mcg daily with good compliance.  She has gone through the death of her husband, mom, and dad within the last several years.  She has been taking care of her sister for the past 2 months and she is very ill with metastatic lung cancer. She is still taking Celexa, but is feeling more overwhelmed and upset.  She has smoked 0.5-1 ppd x35 years. She is not ready to quit smoking. She does worry about getting lung cancer.  She also has longstanding right hip/back pain and would like to see an orthopedist for this if possible.  Depression screen [facility] 2/9 09/04/2019 04/28/2018 10/05/2017  Decreased Interest 2 3 3   Down, Depressed, Hopeless 3 3 2   PHQ - 2 Score 5 6 5   Altered sleeping 2 2 2   Tired, decreased energy 3 2 1   Change in appetite 1 3 2   Feeling bad or failure about yourself  0 3 2  Trouble concentrating 3 2 2   Moving slowly or fidgety/restless 0 1 0  Suicidal thoughts 0 0 0  PHQ-9 Score 14 19 14   Difficult doing work/chores Somewhat difficult Somewhat difficult Very difficult   GAD 7 : Generalized Anxiety Score 09/04/2019  Nervous, Anxious, on Edge 3  Control/stop worrying 3  Worry too much - different things 3  Trouble relaxing 3  Restless 0  Easily annoyed or irritable 2  Afraid - awful might happen 3  Total GAD 7 Score 17  Anxiety  Difficulty Somewhat difficult     Allergies  Allergen Reactions  . Anesthetics, Halogenated Other (See Comments)    Other reaction(s): Other (See Comments)  . Ciprofloxacin     swelling  . Methimazole     hives  . Penicillins Other (See Comments)    RXN AS A CHILD     Current Outpatient Medications:  .  ALPRAZolam (XANAX) 0.5 MG tablet, TAKE 1 TABLET BY MOUTH EVERY DAY AS NEEDED, Disp: 30 tablet, Rfl: 5 .  citalopram (CELEXA) 40 MG tablet, TAKE 1 TABLET BY MOUTH EVERY DAY FOR DEPRESSION, Disp: 90 tablet, Rfl: 1 .  Levothyroxine Sodium 75 MCG CAPS, Take 75 mcg by mouth. Patient takes 75 mcg daily except on Wednesday she takes 119mcg., Disp: , Rfl:  .  oxyCODONE-acetaminophen (PERCOCET/ROXICET) 5-325 MG tablet, Take 1 tablet by mouth every 6 (six) hours as needed for severe pain. (Patient not taking: Reported on 09/04/2019), Disp: 20 tablet, Rfl: 0  Review of Systems  Constitutional: Negative.   HENT: Negative.   Respiratory: Negative.   Cardiovascular: Negative.   Genitourinary: Negative.   Neurological: Negative.   Psychiatric/Behavioral: Positive for decreased concentration, dysphoric mood and sleep disturbance. Negative for behavioral problems, hallucinations, self-injury and suicidal ideas. The patient is nervous/anxious. The patient is not hyperactive.     Social History  Tobacco Use  . Smoking status: Current Every Day Smoker    Packs/day: 0.25    Years: 30.00    Pack years: 7.50    Types: Cigarettes  . Smokeless tobacco: Never Used  Substance Use Topics  . Alcohol use: No    Alcohol/week: 0.0 standard drinks      Objective:   BP (!) 159/75 (BP Location: Left Arm, Patient Position: Sitting, Cuff Size: Normal) Comment: patient crying in office  Pulse 66   Temp (!) 97.2 F (36.2 C) (Temporal)   Resp 16   Wt 133 lb 6.4 oz (60.5 kg)   BMI 24.40 kg/m  Vitals:   09/04/19 1125  BP: (!) 159/75  Pulse: 66  Resp: 16  Temp: (!) 97.2 F (36.2 C)  TempSrc:  Temporal  Weight: 133 lb 6.4 oz (60.5 kg)  Body mass index is 24.4 kg/m.   Physical Exam Vitals reviewed.  Constitutional:      General: She is not in acute distress.    Appearance: Normal appearance. She is well-developed. She is not diaphoretic.  HENT:     Head: Normocephalic and atraumatic.  Eyes:     General: No scleral icterus.    Conjunctiva/sclera: Conjunctivae normal.  Neck:     Thyroid: No thyromegaly.  Cardiovascular:     Rate and Rhythm: Normal rate and regular rhythm.     Pulses: Normal pulses.     Heart sounds: Normal heart sounds. No murmur.  Pulmonary:     Effort: Pulmonary effort is normal. No respiratory distress.     Breath sounds: Normal breath sounds. No wheezing, rhonchi or rales.  Abdominal:     General: There is no distension.     Palpations: Abdomen is soft.     Tenderness: There is no abdominal tenderness.  Genitourinary:    Comments: Breasts: left breast normal without mass, skin or nipple changes or axillary nodes, R breast without mass, skin, or nipple changes.  There is some swelling of her axilla and upper anterior chest wall above her breast.  Musculoskeletal:     Cervical back: Neck supple.     Right lower leg: No edema.     Left lower leg: No edema.  Lymphadenopathy:     Cervical: No cervical adenopathy.  Skin:    General: Skin is warm and dry.     Capillary Refill: Capillary refill takes less than 2 seconds.     Findings: No rash.  Neurological:     Mental Status: She is alert and oriented to person, place, and time. Mental status is at baseline.  Psychiatric:        Mood and Affect: Mood is anxious and depressed. Affect is tearful.        Speech: Speech normal.        Behavior: Behavior is withdrawn. Behavior is cooperative.        Thought Content: Thought content does not include homicidal or suicidal ideation.      No results found for any visits on 09/04/19.     Assessment & Plan    Problem List Items Addressed This Visit       Endocrine   Postablative hypothyroidism - Primary    Chronic and stable Continue Synthroid at current dose Recheck TSH Dose titration pending TSH results Follow-up in 3 to 6 months      Relevant Orders   TSH (Completed)     Musculoskeletal and Integument   MD (muscular dystrophy) ([place])    Chronic and  stable Has some lower extremity weakness May contribute to her right hip/back pain        Other   Anxiety    Longstanding/chronic Exacerbated by grief related to loss of her husband and severe illness in her sister Continue Celexa 40 mg daily Can use Xanax sparingly Encourage therapy Contracted for safety-no SI/HI      Grief reaction with prolonged bereavement    Recurrent grief in the setting of multiple deaths in her family over the last several years She has some depressed mood related to this Continue Celexa 40 mg daily Encourage therapy Contracted for safety-no SI/HI Could consider adding Abilify or Wellbutrin in the future to augment her Celexa if needed      Tobacco use disorder    3 to 5-minute discussion regarding the harms of continued smoking, the benefits of cessation, and methods to help with cessation We also discussed lung cancer screening, which patient agrees to and referral was placed today Given her current stress related to her sister's illness, she is not yet ready to quit, but we will continue to reassess      Relevant Orders   Ambulatory Referral for Lung Cancer Scre   Right axillary swelling    New problem No notable discrete masses, but there is notable swelling of the chest wall and axilla We will send for mammogram and ultrasound      Relevant Orders   MM DIAG BREAST TOMO UNI RIGHT   US BREAST LTD UNI RIGHT INC AXILLA    Other Visit Diagnoses    Need for influenza vaccination       Relevant Orders   Flu Vaccine QUAD 36+ mos PF IM (Fluarix & Fluzone Quad PF) (Completed)   Family history of lung cancer       Relevant Orders    Ambulatory Referral for Lung Cancer Scre   Encounter for screening for lung cancer       Relevant Orders   Ambulatory Referral for Lung Cancer Scre   Encounter for screening mammogram for malignant neoplasm of breast       Relevant Orders   MM 3D SCREEN BREAST UNI LEFT   Right hip pain       Relevant Orders   Ambulatory referral to Orthopedic Surgery       Return in about 6 months (around 03/04/2020) for CPE.   The entirety of the information documented in the History of Present Illness, Review of Systems and Physical Exam were personally obtained by me. Portions of this information were initially documented by Lynford Humphrey, CMA and reviewed by me for thoroughness and accuracy.    Willis Holquin, Dionne Bucy, MD MPH [facility]

## 2019-09-05 ENCOUNTER — Telehealth: Payer: Self-pay

## 2019-09-05 DIAGNOSIS — M7989 Other specified soft tissue disorders: Secondary | ICD-10-CM | POA: Insufficient documentation

## 2019-09-05 LAB — TSH: TSH: 5.23 u[IU]/mL — ABNORMAL HIGH (ref 0.450–4.500)

## 2019-09-05 MED ORDER — LEVOTHYROXINE SODIUM 88 MCG PO TABS: 88.0000 ug | ORAL_TABLET | Freq: Every day | ORAL | 3 refills | Status: DC

## 2019-09-05 NOTE — Telephone Encounter (Signed)
Patient advised as below. Patient reports she is only taking synthroid 74mcg daily. Patient reports she has not been doing the 170mcg on Wednesday.

## 2019-09-05 NOTE — Telephone Encounter (Signed)
Patient advised as below.  

## 2019-09-05 NOTE — Telephone Encounter (Signed)
-----   Message from Virginia Crews, MD sent at 09/05/2019  9:16 AM EST ----- TSH is slightly elevated, meaning that patient is not quite adequately replaced with her Synthroid dose.  Is the current dose that we have in the chart the one that she is taking?  I will need to increase this slightly.

## 2019-09-05 NOTE — Assessment & Plan Note (Signed)
Chronic and stable Continue Synthroid at current dose Recheck TSH Dose titration pending TSH results Follow-up in 3 to 6 months

## 2019-09-05 NOTE — Assessment & Plan Note (Signed)
Longstanding/chronic Exacerbated by grief related to loss of her husband and severe illness in her sister Continue Celexa 40 mg daily Can use Xanax sparingly Encourage therapy Contracted for safety-no SI/HI

## 2019-09-05 NOTE — Assessment & Plan Note (Signed)
Recurrent grief in the setting of multiple deaths in her family over the last several years She has some depressed mood related to this Continue Celexa 40 mg daily Encourage therapy Contracted for safety-no SI/HI Could consider adding Abilify or Wellbutrin in the future to augment her Celexa if needed

## 2019-09-05 NOTE — Assessment & Plan Note (Signed)
Chronic and stable Has some lower extremity weakness May contribute to her right hip/back pain

## 2019-09-05 NOTE — Telephone Encounter (Signed)
We can increase her to the 88mg  daily instead. OK to send eRx for #30 r3. Recheck TSH in 2-3 months

## 2019-09-05 NOTE — Assessment & Plan Note (Signed)
New problem No notable discrete masses, but there is notable swelling of the chest wall and axilla We will send for mammogram and ultrasound

## 2019-09-05 NOTE — Assessment & Plan Note (Signed)
3 to 5-minute discussion regarding the harms of continued smoking, the benefits of cessation, and methods to help with cessation We also discussed lung cancer screening, which patient agrees to and referral was placed today Given her current stress related to her sister's illness, she is not yet ready to quit, but we will continue to reassess

## 2019-09-07 ENCOUNTER — Telehealth: Payer: Self-pay | Admitting: Family Medicine

## 2019-09-07 ENCOUNTER — Telehealth: Payer: Self-pay | Admitting: *Deleted

## 2019-09-07 DIAGNOSIS — M7989 Other specified soft tissue disorders: Secondary | ICD-10-CM

## 2019-09-07 DIAGNOSIS — Z87891 Personal history of nicotine dependence: Secondary | ICD-10-CM

## 2019-09-07 NOTE — Telephone Encounter (Signed)
Received referral for initial lung cancer screening scan. Contacted patient and obtained smoking history,(current smoker, 30 pack year) as well as answering questions related to screening process. Patient denies signs of lung cancer such as weight loss or hemoptysis. Patient denies comorbidity that would prevent curative treatment if lung cancer were found. Patient is scheduled for shared decision making visit and CT scan on 09/11/19.

## 2019-09-07 NOTE — Telephone Encounter (Signed)
Ok to place orders as requested. Thanks!

## 2019-09-07 NOTE — Telephone Encounter (Signed)
Copied from [place] [id number]. Topic: General - Other >> Sep 07, 2019 11:35 AM Parke Poisson wrote: Reason for CRM: Per [place] they will need an order for bilateral diagnostic mammogram TOMO [id number] and left breast limited ultrasound [id number]

## 2019-09-11 ENCOUNTER — Inpatient Hospital Stay: Payer: PPO | Attending: Oncology | Admitting: Oncology

## 2019-09-11 ENCOUNTER — Ambulatory Visit: Admission: RE | Admit: 2019-09-11 | Payer: PPO | Source: Ambulatory Visit

## 2019-09-11 DIAGNOSIS — Z87891 Personal history of nicotine dependence: Secondary | ICD-10-CM

## 2019-09-11 NOTE — Addendum Note (Signed)
Addended by: Ashley Royalty E on: 09/11/2019 01:39 PM   Modules accepted: Orders

## 2019-09-11 NOTE — Telephone Encounter (Signed)
Please correct mammogram order,[place] will not scheduled [id number]

## 2019-09-11 NOTE — Telephone Encounter (Signed)
From [facility] 

## 2019-09-11 NOTE — Progress Notes (Signed)
Virtual Visit via Video Note  I connected with Mrs. Walthall on 09/11/19 at 10:00 AM EST by a video enabled telemedicine application and verified that I am speaking with the correct person using two identifiers.  Location: Patient: Home Provider: Office   I discussed the limitations of evaluation and management by telemedicine and the availability of in person appointments. The patient expressed understanding and agreed to proceed.  I discussed the assessment and treatment plan with the patient. The patient was provided an opportunity to ask questions and all were answered. The patient agreed with the plan and demonstrated an understanding of the instructions.   The patient was advised to call back or seek an in-person evaluation if the symptoms worsen or if the condition fails to improve as anticipated.   In accordance with CMS guidelines, patient has met eligibility criteria including age, absence of signs or symptoms of lung cancer.  Social History   Tobacco Use  . Smoking status: Current Every Day Smoker    Packs/day: 0.50    Years: 35.00    Pack years: 17.50    Types: Cigarettes  . Smokeless tobacco: Never Used  Substance Use Topics  . Alcohol use: No    Alcohol/week: 0.0 standard drinks  . Drug use: No      A shared decision-making session was conducted prior to the performance of CT scan. This includes one or more decision aids, includes benefits and harms of screening, follow-up diagnostic testing, over-diagnosis, false positive rate, and total radiation exposure.   Counseling on the importance of adherence to annual lung cancer LDCT screening, impact of co-morbidities, and ability or willingness to undergo diagnosis and treatment is imperative for compliance of the program.   Counseling on the importance of continued smoking cessation for former smokers; the importance of smoking cessation for current smokers, and information about tobacco cessation interventions have been  given to patient including [place] and [address] quit [place] programs.   Written order for lung cancer screening with LDCT has been given to the patient and any and all questions have been answered to the best of my abilities.    Yearly follow up will be coordinated by Burgess Estelle, Thoracic Navigator.  I provided 15 minutes of face-to-face video visit time during this encounter, and > 50% was spent counseling as documented under my assessment & plan.   Jacquelin Hawking, NP

## 2019-09-25 ENCOUNTER — Other Ambulatory Visit: Payer: PPO

## 2019-10-07 ENCOUNTER — Other Ambulatory Visit: Payer: Self-pay | Admitting: Physician Assistant

## 2019-10-07 DIAGNOSIS — F419 Anxiety disorder, unspecified: Secondary | ICD-10-CM

## 2019-10-08 ENCOUNTER — Telehealth: Payer: Self-pay | Admitting: *Deleted

## 2019-10-08 NOTE — Telephone Encounter (Signed)
Received referral for low dose lung cancer screening CT scan. Message left at phone number listed in EMR for patient to call me back to facilitate scheduling scan.  

## 2019-10-16 ENCOUNTER — Ambulatory Visit: Payer: PPO

## 2019-10-16 ENCOUNTER — Inpatient Hospital Stay: Payer: PPO | Admitting: Nurse Practitioner

## 2019-10-23 ENCOUNTER — Encounter: Payer: Self-pay | Admitting: Oncology

## 2019-10-23 ENCOUNTER — Inpatient Hospital Stay: Payer: PPO | Admitting: Oncology

## 2019-10-23 ENCOUNTER — Other Ambulatory Visit: Payer: Self-pay

## 2019-10-23 ENCOUNTER — Ambulatory Visit: Admission: RE | Admit: 2019-10-23 | Payer: PPO | Source: Ambulatory Visit

## 2019-11-06 ENCOUNTER — Telehealth: Payer: Self-pay | Admitting: *Deleted

## 2019-11-06 NOTE — Telephone Encounter (Signed)
Received referral for low dose lung cancer screening CT scan. Message left at phone number listed in EMR for patient to call me back to facilitate scheduling scan. Patient has had prior no shows for this appt.

## 2019-11-12 ENCOUNTER — Other Ambulatory Visit: Payer: Self-pay

## 2019-11-12 ENCOUNTER — Inpatient Hospital Stay: Payer: PPO | Attending: Oncology | Admitting: Oncology

## 2019-11-12 ENCOUNTER — Ambulatory Visit
Admission: RE | Admit: 2019-11-12 | Discharge: 2019-11-12 | Disposition: A | Discharge status: Home / Self Care | Payer: PPO | Source: Ambulatory Visit | Attending: Oncology | Admitting: Oncology

## 2019-11-12 DIAGNOSIS — Z87891 Personal history of nicotine dependence: Secondary | ICD-10-CM

## 2019-11-12 DIAGNOSIS — F17210 Nicotine dependence, cigarettes, uncomplicated: Secondary | ICD-10-CM | POA: Diagnosis not present

## 2019-11-12 NOTE — Progress Notes (Signed)
Virtual Visit via Video Note  I connected with Mrs. Stayer on 11/12/19 at 10:00 AM EST by a video enabled telemedicine application and verified that I am speaking with the correct person using two identifiers.  Location: Patient: [facility] Provider: Home   I discussed the limitations of evaluation and management by telemedicine and the availability of in person appointments. The patient expressed understanding and agreed to proceed.  I discussed the assessment and treatment plan with the patient. The patient was provided an opportunity to ask questions and all were answered. The patient agreed with the plan and demonstrated an understanding of the instructions.   The patient was advised to call back or seek an in-person evaluation if the symptoms worsen or if the condition fails to improve as anticipated.   In accordance with CMS guidelines, patient has met eligibility criteria including age, absence of signs or symptoms of lung cancer.  Social History   Tobacco Use  . Smoking status: Current Every Day Smoker    Packs/day: 0.75    Years: 40.00    Pack years: 30.00    Types: Cigarettes  . Smokeless tobacco: Never Used  Substance Use Topics  . Alcohol use: No    Alcohol/week: 0.0 standard drinks  . Drug use: No      A shared decision-making session was conducted prior to the performance of CT scan. This includes one or more decision aids, includes benefits and harms of screening, follow-up diagnostic testing, over-diagnosis, false positive rate, and total radiation exposure.   Counseling on the importance of adherence to annual lung cancer LDCT screening, impact of co-morbidities, and ability or willingness to undergo diagnosis and treatment is imperative for compliance of the program.   Counseling on the importance of continued smoking cessation for former smokers; the importance of smoking cessation for current smokers, and information about tobacco cessation interventions have been  given to patient including [place] and [facility] quit [place] programs.   Written order for lung cancer screening with LDCT has been given to the patient and any and all questions have been answered to the best of my abilities.    Yearly follow up will be coordinated by Burgess Estelle, Thoracic Navigator.  I provided 15 minutes of face-to-face video visit time during this encounter, and > 50% was spent counseling as documented under my assessment & plan.   Jacquelin Hawking, NP

## 2019-11-16 ENCOUNTER — Encounter: Payer: Self-pay | Admitting: *Deleted

## 2019-11-28 ENCOUNTER — Other Ambulatory Visit: Payer: Self-pay | Admitting: Family Medicine

## 2019-11-28 NOTE — Telephone Encounter (Signed)
LMTCB 11/28/2019  Thanks,   -Mickel Baas

## 2019-11-28 NOTE — Telephone Encounter (Signed)
Pt is due to have her TSH rechecked.  Thanks,   -Mickel Baas

## 2019-11-28 NOTE — Telephone Encounter (Signed)
Requested medications are due for refill today?  Yes  Requested medications are on active medication list?  Yes  Last Refill:   09/05/2019 # 30 with 3 refills  Future visit scheduled?  No  Notes to Clinic:  Per RX refill protocol TSH needs to e rechecked within 3 months after an abnormal results.  Abnormal result in December 2020.  Patient does not have a f/u appointment to get TSH checked.

## 2019-11-28 NOTE — Telephone Encounter (Signed)
Copied from [place] [id number]. Topic: Clinical - COVID Pre-Screen >> Nov 28, 2019  4:50 PM Wynetta Emery, [place] C wrote: 1. To the best of your knowledge, have you been in close contact with anyone with a confirmed diagnosis of COVID 19?  NO  If no - Proceed to next question; If yes - Schedule patient for a virtual visit  2. Have you had any one or more of the following: fever, chills, cough, shortness of breath or any flu-like symptoms?  NO  If no - Proceed to next question; If yes - Schedule patient for a virtual visit  3. Have you been diagnosed with or have a previous diagnosis of COVID 19?  NO  If no - Proceed to next question; If yes - Schedule patient for a virtual visit  4. I am going to go over a few other symptoms with you. Please let me know if you are experiencing any of the following: NO  Ear, nose or throat discomfort  A sore throat  Headache  Muscle pain  Diarrhea  Loss of taste or smell  If no - Continue with scheduling process; If yes - Document in scheduling notes   Thank you for answering these questions. Please know we will ask you these questions or similar questions when you arrive for your appointment and again it's how we are keeping everyone safe. Also, to keep you safe, please use the provided hand sanitizer when you enter the building. [place], we are asking everyone in the building to wear a mask because they help [place] prevent the spread of germs.   Do you have a mask of your own, if not, we are happy to provide one for you. The last thing I want to go over with you is the no visitor guidelines. This means no one can attend the appointment with you unless you need physical assistance. I understand this may be different from your past appointments and I know this may be difficult but please know if someone is driving you we are happy to call them for you once your appointment is over.

## 2019-11-29 ENCOUNTER — Ambulatory Visit
Admission: RE | Admit: 2019-11-29 | Discharge: 2019-11-29 | Disposition: A | Discharge status: Home / Self Care | Payer: PPO | Source: Ambulatory Visit | Attending: Family Medicine | Admitting: Family Medicine

## 2019-11-29 ENCOUNTER — Other Ambulatory Visit: Payer: Self-pay

## 2019-11-29 ENCOUNTER — Ambulatory Visit (INDEPENDENT_AMBULATORY_CARE_PROVIDER_SITE_OTHER): Payer: PPO | Admitting: Family Medicine

## 2019-11-29 ENCOUNTER — Encounter: Payer: Self-pay | Admitting: Family Medicine

## 2019-11-29 VITALS — BP 136/70 | HR 64 | Temp 96.6°F | Wt 133.0 lb

## 2019-11-29 DIAGNOSIS — F330 Major depressive disorder, recurrent, mild: Secondary | ICD-10-CM

## 2019-11-29 DIAGNOSIS — S76011D Strain of muscle, fascia and tendon of right hip, subsequent encounter: Secondary | ICD-10-CM

## 2019-11-29 DIAGNOSIS — M7061 Trochanteric bursitis, right hip: Secondary | ICD-10-CM

## 2019-11-29 DIAGNOSIS — G7100 Muscular dystrophy, unspecified: Secondary | ICD-10-CM

## 2019-11-29 DIAGNOSIS — G4700 Insomnia, unspecified: Secondary | ICD-10-CM | POA: Diagnosis not present

## 2019-11-29 DIAGNOSIS — F419 Anxiety disorder, unspecified: Secondary | ICD-10-CM | POA: Diagnosis not present

## 2019-11-29 DIAGNOSIS — E890 Postprocedural hypothyroidism: Secondary | ICD-10-CM | POA: Diagnosis not present

## 2019-11-29 DIAGNOSIS — M25551 Pain in right hip: Secondary | ICD-10-CM | POA: Diagnosis not present

## 2019-11-29 MED ORDER — TRAZODONE HCL 50 MG PO TABS: 25.0000 mg | ORAL_TABLET | Freq: Every evening | ORAL | 3 refills | Status: DC | PRN

## 2019-11-29 NOTE — Patient Instructions (Signed)
Hip Bursitis  Hip bursitis is swelling of a fluid-filled sac (bursa) in your hip joint. This swelling (inflammation) can be painful. This condition may come and go over time. What are the causes?  Injury to the hip.  Overuse of the muscles that surround the hip joint.  An earlier injury or surgery of the hip.  Arthritis or gout.  Diabetes.  Thyroid disease.  Infection.  In some cases, the cause may not be known. What are the signs or symptoms?  Mild or moderate pain in the hip area. Pain may get worse with movement.  Tenderness and swelling of the hip, especially on the outer side of the hip.  In rare cases, the bursa may become infected. This may cause: ? A fever. ? Warmth and redness in the area. Symptoms may come and go. How is this treated? This condition is treated by resting, icing, applying pressure (compression), and raising (elevating) the injured area. You may hear this called the RICE treatment. Treatment may also include:  Using crutches.  Draining fluid out of the bursa to help relieve swelling.  Giving a shot of (injecting) medicine that helps to reduce swelling (cortisone).  Other medicines if the bursa is infected. Follow these instructions at home: Managing pain, stiffness, and swelling   If told, put ice on the painful area. ? Put ice in a plastic bag. ? Place a towel between your skin and the bag. ? Leave the ice on for 20 minutes, 2-3 times a day. ? Raise (elevate) your hip above the level of your heart as much as you can without pain. To do this, try putting a pillow under your hips while you lie down. Stop if this causes pain. Activity  Return to your normal activities as told by your doctor. Ask your doctor what activities are safe for you.  Rest and protect your hip as much as you can until you feel better. General instructions  Take over-the-counter and prescription medicines only as told by your doctor.  Wear wraps that put pressure  on your hip (compression wraps) only as told by your doctor.  Do not use your hip to support your body weight until your doctor says that you can.  Use crutches as told by your doctor.  Gently rub and stretch your injured area as often as is comfortable.  Keep all follow-up visits as told by your doctor. This is important. How is this prevented?  Exercise regularly, as told by your doctor.  Warm up and stretch before being active.  Cool down and stretch after being active.  Avoid activities that bother your hip or cause pain.  Avoid sitting down for long periods at a time. Contact a doctor if:  You have a fever.  You get new symptoms.  You have trouble walking.  You have trouble doing everyday activities.  You have pain that gets worse.  You have pain that does not get better with medicine.  You get red skin on your hip area.  You get a feeling of warmth in your hip area. Get help right away if:  You cannot move your hip.  You have very bad pain. Summary  Hip bursitis is swelling of a fluid-filled sac (bursa) in your hip.  Hip bursitis can be painful.  Symptoms often come and go over time.  This condition is treated with rest, ice, compression, elevation, and medicines. This information is not intended to replace advice given to you by your health care provider.   Make sure you discuss any questions you have with your health care provider. Document Revised: 05/15/2018 Document Reviewed: 05/15/2018 Elsevier Patient Education  2020 Elsevier Inc.  

## 2019-11-29 NOTE — Progress Notes (Signed)
Patient: Tara Scott Female    DOB: [birth year]   59 y.o.   MRN: [id number] Visit Date: 11/29/2019  Today's Provider: Lavon Paganini, MD   Chief Complaint  Patient presents with  . Hypothyroidism  . Insomnia   Subjective:     Thyroid Problem Presents for follow-up visit. Symptoms include anxiety, constipation and hair loss. Patient reports no cold intolerance, depressed mood, diaphoresis, diarrhea or dry skin.  Insomnia Primary symptoms: sleep disturbance, difficulty falling asleep.  The problem occurs nightly. The problem is unchanged. How many beverages per day that contain caffeine: 0 - 1.  Typical bedtime:  10-11 P.M..  How long after going to bed to you fall asleep: over an hour.      R hip continues to hurt intemittently.  Worse with walking up stairs.  Tearing pain.  Somewhat in back as well.  No radiation down leg.  Some groin pain.  Ongoing for 3-6 months.  Allergies  Allergen Reactions  . Anesthetics, Halogenated Other (See Comments)    Other reaction(s): Other (See Comments)  . Ciprofloxacin     swelling  . Methimazole     hives  . Penicillins Other (See Comments)    RXN AS A CHILD     Current Outpatient Medications:  .  ALPRAZolam (XANAX) 0.5 MG tablet, TAKE 1 TABLET BY MOUTH EVERY DAY AS NEEDED, Disp: 30 tablet, Rfl: 5 .  citalopram (CELEXA) 40 MG tablet, TAKE 1 TABLET BY MOUTH EVERY DAY FOR DEPRESSION, Disp: 90 tablet, Rfl: 1 .  levothyroxine (SYNTHROID) 88 MCG tablet, Take 1 tablet (88 mcg total) by mouth daily., Disp: 30 tablet, Rfl: 3 .  oxyCODONE-acetaminophen (PERCOCET/ROXICET) 5-325 MG tablet, Take 1 tablet by mouth every 6 (six) hours as needed for severe pain. (Patient not taking: Reported on 09/04/2019), Disp: 20 tablet, Rfl: 0  Review of Systems  Constitutional: Negative.  Negative for diaphoresis.  Respiratory: Negative.   Cardiovascular: Negative.   Gastrointestinal: Positive for constipation. Negative for abdominal distention,  abdominal pain, anal bleeding, blood in stool, diarrhea, nausea, rectal pain and vomiting.  Endocrine: Negative.  Negative for cold intolerance.  Psychiatric/Behavioral: Positive for sleep disturbance. The patient is nervous/anxious and has insomnia.     Social History   Tobacco Use  . Smoking status: Current Every Day Smoker    Packs/day: 0.75    Years: 40.00    Pack years: 30.00    Types: Cigarettes  . Smokeless tobacco: Never Used  Substance Use Topics  . Alcohol use: No    Alcohol/week: 0.0 standard drinks      Objective:   BP 136/70 (BP Location: Right Arm, Patient Position: Sitting, Cuff Size: Normal)   Pulse 64   Temp (!) 96.6 F (35.9 C) (Temporal)   Wt 133 lb (60.3 kg)   SpO2 99%   BMI 24.33 kg/m  Vitals:   11/29/19 1509  BP: 136/70  Pulse: 64  Temp: (!) 96.6 F (35.9 C)  TempSrc: Temporal  SpO2: 99%  Weight: 133 lb (60.3 kg)  Body mass index is 24.33 kg/m.   Physical Exam Vitals reviewed.  Constitutional:      General: She is not in acute distress.    Appearance: Normal appearance. She is well-developed. She is not diaphoretic.  HENT:     Head: Normocephalic and atraumatic.  Eyes:     General: No scleral icterus.    Conjunctiva/sclera: Conjunctivae normal.  Neck:     Thyroid: No thyromegaly.  Cardiovascular:  Rate and Rhythm: Normal rate and regular rhythm.     Pulses: Normal pulses.     Heart sounds: Normal heart sounds. No murmur.  Pulmonary:     Effort: Pulmonary effort is normal. No respiratory distress.     Breath sounds: Normal breath sounds. No wheezing, rhonchi or rales.  Musculoskeletal:     Cervical back: Neck supple.     Right lower leg: No edema.     Left lower leg: No edema.     Comments: No midline spinous process TTP, no SI joint tenderness to palpation.  Negative straight leg raise.  Strength in lower extremities is symmetrically decreased to about 4 out of 5 and this is her baseline.  Tenderness to palpation of her right  gluteus minimus muscle and right greater trochanter.  Lymphadenopathy:     Cervical: No cervical adenopathy.  Skin:    General: Skin is warm and dry.     Findings: No rash.  Neurological:     Mental Status: She is alert and oriented to person, place, and time. Mental status is at baseline.  Psychiatric:        Mood and Affect: Mood normal.        Behavior: Behavior normal.      No results found for any visits on 11/29/19.     Assessment & Plan    Problem List Items Addressed This Visit      Endocrine   Postablative hypothyroidism - Primary    Synthroid dose was increased at last visit She is doing well and asymptomatic Recheck TSH      Relevant Orders   TSH (Completed)     Musculoskeletal and Integument   MD (muscular dystrophy) ([place])    Chronic and stable Bilateral lower extremity weakness at baseline Likely contributing to her poor biomechanics leading to her right hip pain      Relevant Orders   DG Hip Unilat W OR W/O Pelvis 2-3 Views Right (Completed)   Ambulatory referral to [place]     Other   Anxiety    Longstanding/chronic 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 Continue Celexa 40 mg daily Has Xanax, but uses it very infrequently, last refill was for 30 pills in April 2020 Encourage therapy Contract for safety-no SI/HI      Relevant Medications   traZODone (DESYREL) 50 MG tablet   MDD (major depressive disorder)    Chronic and stable Continue Celexa 40 mg daily Encourage therapy Contracted for safety-no SI/HI Repeat PHQ-9 and GAD-7 at next visit      Relevant Medications   traZODone (DESYREL) 50 MG tablet   Cannot sleep    Chronic difficulty with initiation of sleep Trial of low-dose trazodone as needed       Other Visit Diagnoses    Muscle strain of right gluteal region, subsequent encounter       Relevant Orders   DG Hip Unilat W OR W/O Pelvis 2-3 Views Right (Completed)   Ambulatory referral to Home  Health   Trochanteric bursitis of right hip       Relevant Orders   DG Hip Unilat W OR W/O Pelvis 2-3 Views Right (Completed)   Ambulatory referral to [place]    -Ongoing right lateral hip pain for 2 to 3 months -Tenderness over gluteus minimus and right greater trochanter on exam today -Gait is antalgic and Trendelenburg -Suspect that her poor biomechanics related to muscular dystrophy and lower extremity weakness contributes  to this - start [place] PT - NSAIDs prn -Xray to ensure no bony pathology - return precautions discussed   Return in about 6 months (around 05/31/2020) for CPE.   The entirety of the information documented in the History of Present Illness, Review of Systems and Physical Exam were personally obtained by me. Portions of this information were initially documented by Ashley Royalty, CMA and reviewed by me for thoroughness and accuracy.    [place], Dionne Bucy, MD MPH [facility]

## 2019-11-30 ENCOUNTER — Telehealth: Payer: Self-pay

## 2019-11-30 ENCOUNTER — Ambulatory Visit: Payer: Self-pay | Admitting: *Deleted

## 2019-11-30 DIAGNOSIS — M7072 Other bursitis of hip, left hip: Secondary | ICD-10-CM

## 2019-11-30 DIAGNOSIS — M7071 Other bursitis of hip, right hip: Secondary | ICD-10-CM

## 2019-11-30 LAB — TSH: TSH: 4.83 u[IU]/mL — ABNORMAL HIGH (ref 0.450–4.500)

## 2019-11-30 MED ORDER — LEVOTHYROXINE SODIUM 100 MCG PO TABS: 100.0000 ug | ORAL_TABLET | Freq: Every day | ORAL | 1 refills | Status: DC

## 2019-11-30 MED ORDER — MELOXICAM 7.5 MG PO TABS: 7.5000 mg | ORAL_TABLET | Freq: Every day | ORAL | 0 refills | Status: DC

## 2019-11-30 NOTE — Assessment & Plan Note (Signed)
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

## 2019-11-30 NOTE — Assessment & Plan Note (Signed)
Synthroid dose was increased at last visit She is doing well and asymptomatic Recheck TSH

## 2019-11-30 NOTE — Telephone Encounter (Signed)
Will send in meloxicam for her.

## 2019-11-30 NOTE — Telephone Encounter (Signed)
-----   Message from Virginia Crews, MD sent at 11/30/2019 10:15 AM EST ----- TSH is still slightly high.  If taking Synthroid regularly, needs to increase dose again to 100 mcg daily.  Make sure she is not taking other supplements at same time as Synthroid either. Repeat TSH in 2 months

## 2019-11-30 NOTE — Telephone Encounter (Signed)
Patient advised as below. Patient reports that she is taking Synthroid 2mcg daily on a empty stomach alone. Patient verbalizes understanding and is in agreement with treatment plan.

## 2019-11-30 NOTE — Assessment & Plan Note (Signed)
Chronic and stable Continue Celexa 40 mg daily Encourage therapy Contracted for safety-no SI/HI Repeat PHQ-9 and GAD-7 at next visit

## 2019-11-30 NOTE — Assessment & Plan Note (Signed)
Chronic difficulty with initiation of sleep Trial of low-dose trazodone as needed

## 2019-11-30 NOTE — Telephone Encounter (Signed)
Patient was seen in office yesterday for hip pain- she reports today it seems worse- it has moved into the other hip and feels like not only joint- but muscle pain- deep inside. Patient wants to know if there is anything she can do before the physical therapy- medication? Patient has not taken anything at this point for the pain.  Reason for Disposition . [1] SEVERE pain (e.g., excruciating, unable to do any normal activities) AND [2] not improved after 2 hours of pain medicine  Answer Assessment - Initial Assessment Questions 1. LOCATION and RADIATION: "Where is the pain located?"      Both hips 2. QUALITY: "What does the pain feel like?"  (e.g., sharp, dull, aching, burning)     Burning in muscle and deep hard pain 3. SEVERITY: "How bad is the pain?" "What does it keep you from doing?"   (Scale 1-10; or mild, moderate, severe)   -  MILD (1-3): doesn't interfere with normal activities    -  MODERATE (4-7): interferes with normal activities (e.g., work or school) or awakens from sleep, limping    -  SEVERE (8-10): excruciating pain, unable to do any normal activities, unable to walk     7-8 4. ONSET: "When did the pain start?" "Does it come and go, or is it there all the time?"     A good while- patient reports the pain moved today into other hip 5. WORK OR EXERCISE: "Has there been any recent work or exercise that involved this part of the body?"      no 6. CAUSE: "What do you think is causing the hip pain?"      bursitis 7. AGGRAVATING FACTORS: "What makes the hip pain worse?" (e.g., walking, climbing stairs, running)     Moving- unable 8. OTHER SYMPTOMS: "Do you have any other symptoms?" (e.g., back pain, pain shooting down leg,  fever, rash)     no  Protocols used: HIP PAIN-A-AH

## 2019-11-30 NOTE — Addendum Note (Signed)
Addended by: Mar Daring on: 11/30/2019 06:47 PM   Modules accepted: Orders

## 2019-11-30 NOTE — Telephone Encounter (Signed)
Summary: burstitis- pain, otc recommendations    Patient seen by PCP yesterday. Requesting call back from RN to discuss what she is able to take medication wise to ease pain of "burstitis". Please advise.

## 2019-11-30 NOTE — Telephone Encounter (Signed)
Patient advised as below.  

## 2019-11-30 NOTE — Assessment & Plan Note (Signed)
Chronic and stable Bilateral lower extremity weakness at baseline Likely contributing to her poor biomechanics leading to her right hip pain

## 2019-11-30 NOTE — Telephone Encounter (Signed)
-----   Message from Virginia Crews, MD sent at 11/30/2019 10:14 AM EST ----- Normal hip XRay

## 2019-12-23 ENCOUNTER — Other Ambulatory Visit: Payer: Self-pay | Admitting: Family Medicine

## 2019-12-23 NOTE — Telephone Encounter (Signed)
Requested Prescriptions  Pending Prescriptions Disp Refills  . traZODone (DESYREL) 50 MG tablet [Pharmacy Med Name: TRAZODONE 50 MG TABLET] 90 tablet 0    Sig: TAKE 1/2 TO 1 TABLET BY MOUTH AT BEDTIME AS NEEDED FOR SLEEP     Psychiatry: Antidepressants - Serotonin Modulator Passed - 12/23/2019 12:32 PM      Passed - Completed PHQ-2 or PHQ-9 in the last 360 days.      Passed - Valid encounter within last 6 months    Recent Outpatient Visits          3 weeks ago Postablative hypothyroidism   [facility] [place], Dionne Bucy, MD   3 months ago Postablative hypothyroidism   [facility] [place], Dionne Bucy, MD   10 months ago Pain, dental   [facility] [place], Dionne Bucy, MD   11 months ago Tooth abscess   [facility], [place]   1 year ago Abscess   [facility], [place], [place]

## 2019-12-24 ENCOUNTER — Other Ambulatory Visit: Payer: Self-pay | Admitting: Physician Assistant

## 2019-12-24 DIAGNOSIS — M7071 Other bursitis of hip, right hip: Secondary | ICD-10-CM

## 2020-03-07 ENCOUNTER — Encounter: Payer: Self-pay | Admitting: Family Medicine

## 2020-03-17 ENCOUNTER — Ambulatory Visit: Payer: PPO | Admitting: Family Medicine

## 2020-03-19 ENCOUNTER — Other Ambulatory Visit: Payer: Self-pay

## 2020-03-19 ENCOUNTER — Encounter: Payer: Self-pay | Admitting: Physician Assistant

## 2020-03-19 ENCOUNTER — Ambulatory Visit (INDEPENDENT_AMBULATORY_CARE_PROVIDER_SITE_OTHER): Payer: PPO | Admitting: Physician Assistant

## 2020-03-19 VITALS — BP 128/68 | HR 62 | Temp 96.8°F | Wt 131.0 lb

## 2020-03-19 DIAGNOSIS — E890 Postprocedural hypothyroidism: Secondary | ICD-10-CM

## 2020-03-19 DIAGNOSIS — F419 Anxiety disorder, unspecified: Secondary | ICD-10-CM

## 2020-03-19 DIAGNOSIS — L659 Nonscarring hair loss, unspecified: Secondary | ICD-10-CM

## 2020-03-19 DIAGNOSIS — G4700 Insomnia, unspecified: Secondary | ICD-10-CM

## 2020-03-19 MED ORDER — ALPRAZOLAM 0.5 MG PO TABS: 0.5000 mg | ORAL_TABLET | Freq: Every day | ORAL | 0 refills | Status: DC | PRN

## 2020-03-19 NOTE — Patient Instructions (Signed)
Hypothyroidism  Hypothyroidism is when the thyroid gland does not make enough of certain hormones (it is underactive). The thyroid gland is a small gland located in the lower front part of the neck, just in front of the windpipe (trachea). This gland makes hormones that help control how the body uses food for energy (metabolism) as well as how the heart and brain function. These hormones also play a role in keeping your bones strong. When the thyroid is underactive, it produces too little of the hormones thyroxine (T4) and triiodothyronine (T3). What are the causes? This condition may be caused by:  Hashimoto's disease. This is a disease in which the body's disease-fighting system (immune system) attacks the thyroid gland. This is the most common cause.  Viral infections.  Pregnancy.  Certain medicines.  Birth defects.  Past radiation treatments to the head or neck for cancer.  Past treatment with radioactive iodine.  Past exposure to radiation in the environment.  Past surgical removal of part or all of the thyroid.  Problems with a gland in the center of the brain (pituitary gland).  Lack of enough iodine in the diet. What increases the risk? You are more likely to develop this condition if:  You are female.  You have a family history of thyroid conditions.  You use a medicine called lithium.  You take medicines that affect the immune system (immunosuppressants). What are the signs or symptoms? Symptoms of this condition include:  Feeling as though you have no energy (lethargy).  Not being able to tolerate cold.  Weight gain that is not explained by a change in diet or exercise habits.  Lack of appetite.  Dry skin.  Coarse hair.  Menstrual irregularity.  Slowing of thought processes.  Constipation.  Sadness or depression. How is this diagnosed? This condition may be diagnosed based on:  Your symptoms, your medical history, and a physical exam.  Blood  tests. You may also have imaging tests, such as an ultrasound or MRI. How is this treated? This condition is treated with medicine that replaces the thyroid hormones that your body does not make. After you begin treatment, it may take several weeks for symptoms to go away. Follow these instructions at home:  Take over-the-counter and prescription medicines only as told by your health care provider.  If you start taking any new medicines, tell your health care provider.  Keep all follow-up visits as told by your health care provider. This is important. ? As your condition improves, your dosage of thyroid hormone medicine may change. ? You will need to have blood tests regularly so that your health care provider can monitor your condition. Contact a health care provider if:  Your symptoms do not get better with treatment.  You are taking thyroid replacement medicine and you: ? Sweat a lot. ? Have tremors. ? Feel anxious. ? Lose weight rapidly. ? Cannot tolerate heat. ? Have emotional swings. ? Have diarrhea. ? Feel weak. Get help right away if you have:  Chest pain.  An irregular heartbeat.  A rapid heartbeat.  Difficulty breathing. Summary  Hypothyroidism is when the thyroid gland does not make enough of certain hormones (it is underactive).  When the thyroid is underactive, it produces too little of the hormones thyroxine (T4) and triiodothyronine (T3).  The most common cause is Hashimoto's disease, a disease in which the body's disease-fighting system (immune system) attacks the thyroid gland. The condition can also be caused by viral infections, medicine, pregnancy, or past   radiation treatment to the head or neck.  Symptoms may include weight gain, dry skin, constipation, feeling as though you do not have energy, and not being able to tolerate cold.  This condition is treated with medicine to replace the thyroid hormones that your body does not make. This information  is not intended to replace advice given to you by your health care provider. Make sure you discuss any questions you have with your health care provider. Document Revised: 08/19/2017 Document Reviewed: 08/17/2017 Elsevier Patient Education  2020 Elsevier Inc.  

## 2020-03-19 NOTE — Progress Notes (Signed)
Established patient visit   Patient: Tara Scott   DOB: November 15, 1960   59 y.o. Female  MRN: [id number] Visit Date: 03/19/2020  Today's healthcare provider: Trinna Post, PA-C   Chief Complaint  Patient presents with  . Hypothyroidism  . Insomnia  I,Ceferino Lang M Hilliary Jock,acting as a scribe for Trinna Post, PA-C.,have documented all relevant documentation on the behalf of Trinna Post, PA-C,as directed by  Trinna Post, PA-C while in the presence of Trinna Post, PA-C.  Subjective    HPI  Hypothyroid, follow-up  Lab Results  Component Value Date   TSH 2.310 03/19/2020   TSH 4.830 (H) 11/29/2019   TSH 5.230 (H) 09/04/2019   FREET4 1.40 07/23/2016   T4TOTAL 9.6 04/28/2018   Wt Readings from Last 3 Encounters:  03/19/20 131 lb (59.4 kg)  11/29/19 133 lb (60.3 kg)  11/12/19 133 lb (60.3 kg)    She was last seen for hypothyroid 3 months ago.  Management since that visit includes no changes was made. She reports fair compliance with treatment.  She is not having side effects. Patient reports hair loss.  Symptoms: No change in energy level No constipation  No diarrhea No heat / cold intolerance  No nervousness No palpitations  No weight changes    -----------------------------------------------------------------------------------------  Insomnia Follow up  She presents today for follow up of insomnia. She was last seen for this 3 months ago. Management changes included patient was advised to started Trazodone 50 mg at night but reports she have not started taking the medication due to side effects. Patient asked if she could take melatonin 3 mg nightly and was advised she could.  Anxiety, Follow-up  She was last seen for anxiety 6 months ago. Changes made at last visit include continuing celexa 40 mg daily and Xanax 0.5 mg PRN.   She reports good compliance with treatment. She reports good tolerance of treatment. She is not having side effects.    She feels her anxiety is moderate and Worse since last visit. Her sister has recently died and she was her caretaker.  Symptoms: No chest pain No difficulty concentrating  No dizziness Yes fatigue  No feelings of losing control Yes insomnia  No irritable No palpitations  Yes panic attacks No racing thoughts  No shortness of breath No sweating  No tremors/shakes    GAD-7 Results GAD-7 Generalized Anxiety Disorder Screening Tool 09/04/2019  1. Feeling Nervous, Anxious, or on Edge 3  2. Not Being Able to Stop or Control Worrying 3  3. Worrying Too Much About Different Things 3  4. Trouble Relaxing 3  5. Being So Restless it's Hard To Sit Still 0  6. Becoming Easily Annoyed or Irritable 2  7. Feeling Afraid As If Something Awful Might Happen 3  Total GAD-7 Score 17  Difficulty At Work, Home, or Getting  Along With Others? Somewhat difficult    PHQ-9 Scores PHQ9 SCORE ONLY 09/04/2019 04/28/2018 10/05/2017  PHQ-9 Total Score 14 19 14    Patient notices hair loss and wonders what to do about this.  ---------------------------------------------------------------------------------------------------         Medications: Outpatient Medications Prior to Visit  Medication Sig  . citalopram (CELEXA) 40 MG tablet TAKE 1 TABLET BY MOUTH EVERY DAY FOR DEPRESSION  . levothyroxine (SYNTHROID) 100 MCG tablet Take 1 tablet (100 mcg total) by mouth daily before breakfast.  . meloxicam (MOBIC) 7.5 MG tablet TAKE 1 TABLET BY MOUTH EVERY DAY  .  traZODone (DESYREL) 50 MG tablet TAKE 1/2 TO 1 TABLET BY MOUTH AT BEDTIME AS NEEDED FOR SLEEP  . [DISCONTINUED] ALPRAZolam (XANAX) 0.5 MG tablet TAKE 1 TABLET BY MOUTH EVERY DAY AS NEEDED   No facility-administered medications prior to visit.    Review of Systems  Constitutional: Negative.   Respiratory: Negative.   Cardiovascular: Negative.   Hematological: Negative.       Objective    BP 128/68 (BP Location: Left Arm, Patient Position:  Sitting, Cuff Size: Normal)   Pulse 62   Temp (!) 96.8 F (36 C) (Temporal)   Wt 131 lb (59.4 kg)   SpO2 98%   BMI 23.96 kg/m    Physical Exam Constitutional:      Appearance: Normal appearance.  Cardiovascular:     Rate and Rhythm: Normal rate and regular rhythm.     Heart sounds: Normal heart sounds.  Pulmonary:     Effort: Pulmonary effort is normal.     Breath sounds: Normal breath sounds.  Skin:    General: Skin is warm and dry.     Comments: Hair loss noted.   Neurological:     Mental Status: She is alert and oriented to person, place, and time. Mental status is at baseline.  Psychiatric:        Mood and Affect: Mood normal.        Behavior: Behavior normal.       Results for orders placed or performed in visit on 03/19/20  TSH  Result Value Ref Range   TSH 2.310 0.450 - 4.500 uIU/mL    Assessment & Plan    1. Postablative hypothyroidism  Check thyroid as below. She has postablative hypothyroidism. - TSH  2. Insomnia, unspecified type  Likely multifactorial and due to mood disorder, grief. She has not taken trazodone due to fear of side effects. She would like to take melatonin.   3. Anxiety  Requests refill of Xanax. Will refill for short period. Counseled on grief counseling, she declines. Follow up with PCP for more chronic management.   - ALPRAZolam (XANAX) 0.5 MG tablet; Take 1 tablet (0.5 mg total) by mouth daily as needed.  Dispense: 10 tablet; Refill: 0  4. Hair Loss  Patient has diffuse hair thinning. Counseled it could be multifactorial including chronic disease, stress induced, aging. Counseled on using rogaine, patient does not want to use this.   Return if symptoms worsen or fail to improve.      ITrinna Post, PA-C, have reviewed all documentation for this visit. The documentation on 03/25/20 for the exam, diagnosis, procedures, and orders are all accurate and complete.    Paulene Floor  [facility] [phone] (phone) [phone] (fax)  Council

## 2020-03-20 ENCOUNTER — Telehealth: Payer: Self-pay

## 2020-03-20 LAB — TSH: TSH: 2.31 u[IU]/mL (ref 0.450–4.500)

## 2020-03-20 NOTE — Telephone Encounter (Signed)
-----   Message from Trinna Post, [place] sent at 03/20/2020  8:15 AM EDT ----- Thyroid levels normal, continue synthroid 100 mcg daily.

## 2020-03-20 NOTE — Telephone Encounter (Signed)
Patient was advised and states that she will continue taking her medication.

## 2020-06-02 ENCOUNTER — Encounter: Payer: PPO | Admitting: Family Medicine

## 2020-06-23 ENCOUNTER — Other Ambulatory Visit: Payer: Self-pay | Admitting: Family Medicine

## 2020-06-29 ENCOUNTER — Other Ambulatory Visit: Payer: Self-pay

## 2020-06-29 ENCOUNTER — Encounter: Payer: Self-pay | Admitting: Emergency Medicine

## 2020-06-29 ENCOUNTER — Emergency Department
Admission: EM | Admit: 2020-06-29 | Discharge: 2020-06-30 | Disposition: A | Discharge status: Home / Self Care | Payer: PPO | Attending: Emergency Medicine | Admitting: Emergency Medicine

## 2020-06-29 DIAGNOSIS — M5126 Other intervertebral disc displacement, lumbar region: Secondary | ICD-10-CM | POA: Diagnosis not present

## 2020-06-29 DIAGNOSIS — M545 Low back pain, unspecified: Secondary | ICD-10-CM

## 2020-06-29 DIAGNOSIS — Z79899 Other long term (current) drug therapy: Secondary | ICD-10-CM | POA: Insufficient documentation

## 2020-06-29 DIAGNOSIS — M5489 Other dorsalgia: Secondary | ICD-10-CM | POA: Diagnosis not present

## 2020-06-29 DIAGNOSIS — E890 Postprocedural hypothyroidism: Secondary | ICD-10-CM | POA: Diagnosis not present

## 2020-06-29 DIAGNOSIS — R52 Pain, unspecified: Secondary | ICD-10-CM | POA: Diagnosis not present

## 2020-06-29 DIAGNOSIS — N281 Cyst of kidney, acquired: Secondary | ICD-10-CM | POA: Diagnosis not present

## 2020-06-29 DIAGNOSIS — M2578 Osteophyte, vertebrae: Secondary | ICD-10-CM | POA: Diagnosis not present

## 2020-06-29 DIAGNOSIS — M5416 Radiculopathy, lumbar region: Secondary | ICD-10-CM | POA: Insufficient documentation

## 2020-06-29 DIAGNOSIS — F17210 Nicotine dependence, cigarettes, uncomplicated: Secondary | ICD-10-CM | POA: Insufficient documentation

## 2020-06-29 DIAGNOSIS — R0689 Other abnormalities of breathing: Secondary | ICD-10-CM | POA: Diagnosis not present

## 2020-06-29 DIAGNOSIS — I10 Essential (primary) hypertension: Secondary | ICD-10-CM | POA: Diagnosis not present

## 2020-06-29 DIAGNOSIS — D1809 Hemangioma of other sites: Secondary | ICD-10-CM | POA: Diagnosis not present

## 2020-06-29 NOTE — ED Notes (Signed)
Pt c/o of back pain- see triage note. Pt states the fentanyl given by EMS did not provided relief. Pt denies injury, states she's had back pain in the past but not as long lasting or severe as today. Pt is AOX4, moved to room in recliner, pt moaning.

## 2020-06-29 NOTE — ED Notes (Signed)
Pt still lying in recliner in between triage 1 and 2, roomed in error

## 2020-06-29 NOTE — ED Notes (Addendum)
Call to son for pt to bring in purse - reports "will bring"

## 2020-06-29 NOTE — ED Triage Notes (Signed)
Patient brought in by ems from home. Patient states that she had a sudden on set of lower back pain that started this morning. Patient states that she has not been able to sit due to the pain. Patient had a similar episode in the past but it resolved on it's on. Patient was given 100 mcg Fentanyl by ems.

## 2020-06-29 NOTE — ED Notes (Signed)
Son brought in pt purse, given to pt by [employer]

## 2020-06-30 ENCOUNTER — Emergency Department: Payer: PPO

## 2020-06-30 ENCOUNTER — Ambulatory Visit: Payer: Self-pay | Admitting: *Deleted

## 2020-06-30 DIAGNOSIS — M2578 Osteophyte, vertebrae: Secondary | ICD-10-CM | POA: Diagnosis not present

## 2020-06-30 DIAGNOSIS — N281 Cyst of kidney, acquired: Secondary | ICD-10-CM | POA: Diagnosis not present

## 2020-06-30 DIAGNOSIS — M5126 Other intervertebral disc displacement, lumbar region: Secondary | ICD-10-CM | POA: Diagnosis not present

## 2020-06-30 DIAGNOSIS — D1809 Hemangioma of other sites: Secondary | ICD-10-CM | POA: Diagnosis not present

## 2020-06-30 LAB — CK: Total CK: 59 U/L (ref 38–234)

## 2020-06-30 LAB — BASIC METABOLIC PANEL
Anion gap: 8 (ref 5–15)
BUN: 17 mg/dL (ref 6–20)
CO2: 27 mmol/L (ref 22–32)
Calcium: 8.8 mg/dL — ABNORMAL LOW (ref 8.9–10.3)
Chloride: 105 mmol/L (ref 98–111)
Creatinine, Ser: 0.39 mg/dL — ABNORMAL LOW (ref 0.44–1.00)
GFR, Estimated: >60 mL/min (ref >60)
Glucose, Bld: 84 mg/dL (ref 70–99)
Potassium: 3.8 mmol/L (ref 3.5–5.1)
Sodium: 140 mmol/L (ref 135–145)

## 2020-06-30 LAB — CBC WITH DIFFERENTIAL/PLATELET
Abs Immature Granulocytes: 0.02 10*3/uL (ref 0.00–0.07)
Basophils Absolute: 0 10*3/uL (ref 0.0–0.1)
Basophils Relative: 1 %
Eosinophils Absolute: 0.1 10*3/uL (ref 0.0–0.5)
Eosinophils Relative: 1 %
HCT: 37.5 % (ref 36.0–46.0)
Hemoglobin: 12.8 g/dL (ref 12.0–15.0)
Immature Granulocytes: 0 %
Lymphocytes Relative: 30 %
Lymphs Abs: 1.7 10*3/uL (ref 0.7–4.0)
MCH: 32.8 pg (ref 26.0–34.0)
MCHC: 34.1 g/dL (ref 30.0–36.0)
MCV: 96.2 fL (ref 80.0–100.0)
Monocytes Absolute: 0.5 10*3/uL (ref 0.1–1.0)
Monocytes Relative: 8 %
Neutro Abs: 3.4 10*3/uL (ref 1.7–7.7)
Neutrophils Relative %: 60 %
Platelets: 241 10*3/uL (ref 150–400)
RBC: 3.9 MIL/uL (ref 3.87–5.11)
RDW: 13 % (ref 11.5–15.5)
WBC: 5.7 10*3/uL (ref 4.0–10.5)
nRBC: 0 % (ref 0.0–0.2)

## 2020-06-30 MED ORDER — OXYCODONE-ACETAMINOPHEN 5-325 MG PO TABS: 2.0000 | ORAL_TABLET | Freq: Four times a day (QID) | ORAL | 0 refills | Status: DC | PRN

## 2020-06-30 MED ORDER — OXYCODONE-ACETAMINOPHEN 5-325 MG PO TABS
2.0000 | ORAL_TABLET | Freq: Once | ORAL | Status: AC
  Administered 2020-06-30: 2 via ORAL
  Filled 2020-06-30: qty 2

## 2020-06-30 MED ORDER — KETOROLAC TROMETHAMINE 30 MG/ML IJ SOLN
15.0000 mg | Freq: Once | INTRAMUSCULAR | Status: AC
  Administered 2020-06-30: 15 mg via INTRAVENOUS
  Filled 2020-06-30: qty 1

## 2020-06-30 MED ORDER — MIDAZOLAM HCL 2 MG/2ML IJ SOLN
1.0000 mg | Freq: Once | INTRAMUSCULAR | Status: AC
  Administered 2020-06-30: 1 mg via INTRAVENOUS
  Filled 2020-06-30: qty 2

## 2020-06-30 MED ORDER — MELOXICAM 7.5 MG PO TABS: 7.5000 mg | ORAL_TABLET | Freq: Every day | ORAL | 0 refills | Status: DC

## 2020-06-30 MED ORDER — ONDANSETRON HCL 4 MG PO TABS: 4.0000 mg | ORAL_TABLET | Freq: Three times a day (TID) | ORAL | 0 refills | Status: DC | PRN

## 2020-06-30 MED ORDER — LIDOCAINE 5 % EX PTCH: 1.0000 | MEDICATED_PATCH | Freq: Two times a day (BID) | CUTANEOUS | 0 refills | Status: DC

## 2020-06-30 MED ORDER — LIDOCAINE 5 % EX PTCH
1.0000 | MEDICATED_PATCH | CUTANEOUS | Status: DC
  Administered 2020-06-30: 1 via TRANSDERMAL
  Filled 2020-06-30: qty 1

## 2020-06-30 NOTE — Discharge Instructions (Signed)
You have been seen in the Emergency Department (ED)  today for back pain.  Your workup and exam have not shown any acute abnormalities and you are likely suffering from muscle strain or possible problems with your discs, but there is no treatment that will fix your symptoms at this time.    Take Percocet as prescribed for severe pain. Do not drink alcohol, drive or participate in any other potentially dangerous activities while taking this medication as it may make you sleepy. Do not take this medication with any other sedating medications, either prescription or over-the-counter. If you were prescribed Percocet or Vicodin, do not take these with acetaminophen (Tylenol) as it is already contained within these medications.   This medication is an opiate (or narcotic) pain medication and can be habit forming.  Use it as little as possible to achieve adequate pain control.  Do not use or use it with extreme caution if you have a history of opiate abuse or dependence.  If you are on a pain contract with your primary care doctor or a pain specialist, be sure to let them know you were prescribed this medication today from the [facility] Emergency Department.  This medication is intended for your use only - do not give any to anyone else and keep it in a secure place where nobody else, especially children, have access to it.  It will also cause or worsen constipation, so you may want to consider taking an over-the-counter stool softener while you are taking this medication.  Please follow up with your doctor as soon as possible regarding today's ED visit and your back pain.  Return to the ED for worsening back pain, fever, weakness or numbness of either leg, or if you develop either (1) an inability to urinate or have bowel movements, or (2) loss of your ability to control your bathroom functions (if you start having "accidents"), or if you develop other new symptoms that concern you.

## 2020-06-30 NOTE — Telephone Encounter (Signed)
Ok to send in Zofran 4mg  q8h prn #20 r0. Thanks

## 2020-06-30 NOTE — Addendum Note (Signed)
Addended by: Ashley Royalty E on: 06/30/2020 05:19 PM   Modules accepted: Orders

## 2020-06-30 NOTE — ED Provider Notes (Signed)
[facility] Emergency Department Provider Note  ____________________________________________   First MD Initiated Contact with Patient 06/30/20 0001     (approximate)  I have reviewed the triage vital signs and the nursing notes.   HISTORY  Chief Complaint Back Pain    HPI Tara Scott is a 59 y.o. 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  She has a twin sister with the same diagnosis but milder symptoms that typically present with generalized core weakness.  She presents tonight for evaluation of acute onset and severe low back pain that is causing it to be very painful for her to sit or move around.  This occurred between 11:30 AM and noon and was atraumatic.  She said she was just going about her business when the pain started suddenly.  She had no fall or other injury of which she is aware.  The pain is slightly to the right side of her lower back.  Nothing in particular seems to make it better.  She tried taking Tylenol arthritis formulation and then took a meloxicam she had from prior similar but milder episodes of back pain.  She contacted her primary care doctor who encouraged her to go to an urgent care if the pain did not resolve has has been the case in the past for milder episodes.  All of the urgent cares were closed and when the pain continued for multiple hours she called 911.  She currently reports that the pain is sharp and severe and she seems to have frequent cramps or spasms.  She has no numbness nor tingling in her weakness does not seem to be worse than usual.  She denies fever/chills, sore throat, chest pain, shortness of breath, nausea, vomiting, and abdominal pain.  She had an episode of kidney stones once in the past but said this feels totally different.  She has had no dysuria nor increased urinary frequency.   She also denies urinary  retention and urinary incontinence.  She has had no episodes of bowel incontinence either.        Past Medical History:  Diagnosis Date  . Anxiety   . 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 DVT (deep venous thrombosis) ([place])   . 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   . Malignant hyperthermia   . Toxic multinodular goiter     Patient Active Problem List   Diagnosis Date Noted  . Postablative hypothyroidism 09/04/2019  . Tobacco use disorder 09/04/2019  . Benign essential tremor 12/26/2015  . Anxiety 03/14/2015  . Central core disease ([place]) 03/14/2015  . Dizziness 03/14/2015  . MD (muscular dystrophy) ([place]) 03/14/2015  . Adverse effect of malignant hyperthermia 04/16/2010  . Avitaminosis D 04/16/2010  . Goiter, nontoxic, multinodular 11/04/2009  . Absence of menstruation 08/18/2009  . Acid reflux 12/16/2008  . Cannot sleep 11/17/2008  . Adnexal pain 10/04/2007  . MDD (major depressive disorder) 09/01/2007    Past Surgical History:  Procedure Laterality Date  . ADENOIDECTOMY    . CESAREAN SECTION    . COLONOSCOPY WITH PROPOFOL N/A 03/08/2016   Procedure: COLONOSCOPY WITH PROPOFOL;  Surgeon: Manya Silvas, MD;  Location: [facility] ENDOSCOPY;  Service: Endoscopy;  Laterality: N/A;  . LAPAROTOMY  1984   OVARIAN CYST    Prior to Admission medications   Medication Sig Start Date End Date Taking? Authorizing Provider  ALPRAZolam Duanne Moron) 0.5 MG tablet Take 1 tablet (0.5 mg total) by mouth daily as needed. 03/19/20   Trinna Post, PA-C  citalopram (CELEXA) 40 MG tablet TAKE 1 TABLET BY MOUTH EVERY DAY FOR DEPRESSION 10/07/19   Virginia Crews, MD  levothyroxine (SYNTHROID) 100 MCG tablet TAKE 1 TABLET BY MOUTH DAILY BEFORE BREAKFAST. 06/23/20   Bacigalupo, Dionne Bucy, MD  lidocaine (LIDODERM) 5 % Place 1  patch onto the skin every 12 (twelve) hours. Remove & Discard patch within 12 hours or as directed by MD.  [place] Proud the patch off for 12 hours before applying a new one. 06/30/20 06/30/21  Hinda Kehr, MD  meloxicam (MOBIC) 7.5 MG tablet Take 1 tablet (7.5 mg total) by mouth daily. 06/30/20 06/30/21  Hinda Kehr, MD  oxyCODONE-acetaminophen (PERCOCET) 5-325 MG tablet Take 2 tablets by mouth every 6 (six) hours as needed for severe pain. 06/30/20   Hinda Kehr, MD  traZODone (DESYREL) 50 MG tablet TAKE 1/2 TO 1 TABLET BY MOUTH AT BEDTIME AS NEEDED FOR SLEEP 12/23/19   Virginia Crews, MD    Allergies Anesthetics, halogenated; Ciprofloxacin; Methimazole; and Penicillins  Family History  Problem Relation Age of Onset  . CVA Mother   . Hypercholesterolemia Mother   . Depression Mother   . Rheum arthritis Mother   . Deep vein thrombosis Mother   . Bladder Cancer Father   . Diverticulitis Father   . Pulmonary embolism Father   . Lung cancer Father   . Deep vein thrombosis Sister   . Colon polyps Sister   . Atrial fibrillation Sister   . Muscular dystrophy Sister   . Cancer Sister        lung spreading all over  . Diabetes Son        type 2    Social History Social History   Tobacco Use  . Smoking status: Current Every Day Smoker    Packs/day: 0.75    Years: 40.00    Pack years: 30.00    Types: Cigarettes  . Smokeless tobacco: Never Used  Vaping Use  . Vaping Use: Never used  Substance Use Topics  . Alcohol use: No    Alcohol/week: 0.0 standard drinks  . Drug use: No    Review of Systems Constitutional: No fever/chills Eyes: No visual changes. ENT: No sore throat. Cardiovascular: Denies chest pain. Respiratory: Denies shortness of breath. Gastrointestinal: No abdominal pain.  No nausea, no vomiting.  No diarrhea.  No constipation. Genitourinary: Negative for dysuria. Musculoskeletal: Severe low back pain as described above. Integumentary: Negative for  rash. Neurological: Chronic core weakness secondary to a specific form of muscular dystrophy but she does not report any acute worsening weakness or numbness.   ____________________________________________   PHYSICAL EXAM:  VITAL SIGNS: ED Triage Vitals  Enc Vitals Group     BP 06/29/20 1959 137/70     Pulse Rate 06/29/20 1959 81     Resp 06/29/20 1959 18     Temp 06/29/20 1959 98.1 F (36.7 C)     Temp Source 06/29/20 1959 Oral     SpO2 06/29/20 1959 96 %     Weight 06/29/20 2000 59 kg (130 lb)     Height 06/29/20 2000 1.575 m (5\' 2" )     Head Circumference --      Peak Flow --  Pain Score 06/29/20 2000 7     Pain Loc --      Pain Edu? --      Excl. in [place]? --     Constitutional: Alert and oriented.  Appears very uncomfortable with occasional spasms. Eyes: Conjunctivae are normal.  Head: Atraumatic. Nose: No congestion/rhinnorhea. Mouth/Throat: Patient is wearing a mask. Neck: No stridor.  No meningeal signs.   Cardiovascular: Normal rate, regular rhythm. Good peripheral circulation. Grossly normal heart sounds. Respiratory: Normal respiratory effort.  No retractions. Gastrointestinal: Soft and nontender. No distention.  Musculoskeletal: No lower extremity tenderness nor edema. No gross deformities of extremities. Neurologic:  Normal speech and language.  Patient's exam is difficult due to her low back pain and occasional spasms.  She seems to have decreased strength that is equal on both sides compress 4 out of 10, in terms of leg raises and extensions but she is really not able to participate in the exam due to the back pain. Skin:  Skin is warm, dry and intact.  ____________________________________________   LABS (all labs ordered are listed, but only abnormal results are displayed)  Labs Reviewed  BASIC METABOLIC PANEL - Abnormal; Notable for the following components:      Result Value   Creatinine, Ser 0.39 (*)    Calcium 8.8 (*)    All other components  within normal limits  CBC WITH DIFFERENTIAL/PLATELET  CK  URINALYSIS, ROUTINE W REFLEX MICROSCOPIC   ____________________________________________  EKG  No indication for emergent EKG ____________________________________________  RADIOLOGY Ursula Alert, personally viewed and evaluated these images (plain radiographs) as part of my medical decision making, as well as reviewing the written report by the radiologist.  ED MD interpretation:  No acute abnormalities, some mild disc protrusion  Official radiology report(s): MR LUMBAR SPINE WO CONTRAST  Result Date: 06/30/2020 CLINICAL DATA:  Initial evaluation for low back pain, cauda equina syndrome suspected. EXAM: MRI LUMBAR SPINE WITHOUT CONTRAST TECHNIQUE: Multiplanar, multisequence MR imaging of the lumbar spine was performed. No intravenous contrast was administered. COMPARISON:  Prior radiograph from 07/23/2016. FINDINGS: Segmentation: Standard. Lowest well-formed disc space labeled the L5-S1 level. Alignment: Physiologic with preservation of the normal lumbar lordosis. No listhesis. Vertebrae: Vertebral body height well maintained without acute or chronic fracture. Bone marrow signal intensity within normal limits. 1 cm benign hemangioma noted within the L4 vertebral body. No other discrete or worrisome osseous lesions. Mild discogenic reactive endplate changes present about the L2-3 interspace. No other abnormal marrow edema. Conus medullaris and cauda equina: Conus extends to the L1 level. Conus and cauda equina appear normal. Paraspinal and other soft tissues: Paraspinous soft tissues demonstrate no acute finding. Diffuse fatty infiltration noted throughout the posterior paraspinous and psoas musculature. Subcentimeter simple cyst noted within the lower right kidney. Visualized visceral structures otherwise unremarkable. Disc levels: L1-2:  Unremarkable. L2-3: Mild diffuse disc bulge with disc desiccation. Associated mild reactive  endplate spurring. Superimposed shallow left foraminal to extraforaminal disc protrusion contacts the exiting left L2 nerve root as it courses of the left neural foramen (series 8, image 10). Mild facet hypertrophy. No significant spinal stenosis. Foramina remain patent. L3-4: Disc desiccation without disc bulge. Mild facet hypertrophy. No canal or foraminal stenosis. L4-5: Disc desiccation without disc bulge. Mild to moderate facet hypertrophy. No canal or foraminal stenosis. L5-S1: Disc desiccation without disc bulge. Mild epidural lipomatosis. No canal or foraminal stenosis. IMPRESSION: 1. Shallow left foraminal to extraforaminal disc protrusion at L2-3, contacting and potentially irritating the exiting left L2  nerve root. 2. Mild to moderate facet hypertrophy at L3-4 through L5-S1. 3. No other significant disc pathology within the lumbar spine. No significant stenosis or evidence for neural impingement. No cord compression. Electronically Signed   By: Jeannine Boga M.D.   On: 06/30/2020 02:36    ____________________________________________   PROCEDURES   Procedure(s) performed (including Critical Care):  Procedures   ____________________________________________   INITIAL IMPRESSION / MDM / [place] / ED COURSE  As part of my medical decision making, I reviewed the following data within the [place] notes reviewed and incorporated, Labs reviewed , Old chart reviewed, Notes from prior ED visits and [place] Controlled Substance Database   Differential diagnosis includes, but is not limited to, worsening central cord myositis, rhabdomyolysis, nonspecific muscle spasm, protruding lumbar disc, bursitis, ureteral stone.  Presentation is somewhat confusing given the atraumatic nature of her pain.  It seems most consistent with musculoskeletal spasms but she is complicated due to her history of central cord myositis.  She has been warned against having  muscle relaxers and indeed I verified in rare cases muscle relaxers and general anesthetics can result in malignant hyperthermia.  I think that is unlikely but I will try to avoid muscle relaxers.  I go to begin treatment with Toradol 15 mg IV and Versed 1 mg IV to try to help with the pain in the spasms.  She received fentanyl 100 mcg IV in route to the hospital by the paramedics and said it did not help very much.  Given the complicating factors and need to rule out an emergent condition, I am going to obtain an MRI of the lumbar spine without contrast.  I think an infectious process of her lower back is very unlikely so contrast is unlikely to be helpful.  Her pain will be reassessed after the initial treatment to see if she would benefit from some additional medication.  I am also checking basic labs including a CK to make sure there is no evidence of rhabdomyolysis.     Clinical Course as of Jul 01 351  Mon Jun 30, 2020  0147 CK Total: 59 [CF]  0147 normal BMP  Basic metabolic panel(!) [CF]  7902 normal CBC  CBC with Differential/Platelet [CF]  0256 Generally reassuring MRI, some disc protrusion that could result in lumbar radiculopathy, but no emergent findings.  MR LUMBAR SPINE WO CONTRAST [CF]  [id number] Patient is more comfortable than she was before but is still having some pain.  We talked about treatment options and I am ordering her to Percocet.  I will give her prescription for Percocet, meloxicam, and Lidoderm patch and encourage close outpatient follow-up with Dr. Lacinda Axon with neurosurgery.  She understands and agrees with the plan and indicated that she is looking for to getting home to get some rest.  I gave my usual and customary return precautions.   [CF]    Clinical Course User Index [CF] Hinda Kehr, MD     ____________________________________________  FINAL CLINICAL IMPRESSION(S) / ED DIAGNOSES  Final diagnoses:  Acute right-sided low back pain without sciatica  Lumbar  radiculopathy     MEDICATIONS GIVEN DURING THIS VISIT:  Medications  oxyCODONE-acetaminophen (PERCOCET/ROXICET) 5-325 MG per tablet 2 tablet (has no administration in time range)  lidocaine (LIDODERM) 5 % 1 patch (has no administration in time range)  ketorolac (TORADOL) 30 MG/ML injection 15 mg (15 mg Intravenous Given 06/30/20 0049)  midazolam (VERSED) injection 1 mg (1 mg Intravenous  Given 06/30/20 0049)     ED Discharge Orders         Ordered    oxyCODONE-acetaminophen (PERCOCET) 5-325 MG tablet  Every 6 hours PRN        06/30/20 0351    lidocaine (LIDODERM) 5 %  Every 12 hours        06/30/20 0351    meloxicam (MOBIC) 7.5 MG tablet  Daily        06/30/20 0351          *Please note:  Tara Scott was evaluated in Emergency Department on 06/30/2020 for the symptoms described in the history of present illness. She was evaluated in the context of the global COVID-19 pandemic, which necessitated consideration that the patient might be at risk for infection with the SARS-CoV-2 virus that causes COVID-19. Institutional protocols and algorithms that pertain to the evaluation of patients at risk for COVID-19 are in a state of rapid change based on information released by regulatory bodies including the CDC and federal and state organizations. These policies and algorithms were followed during the patient's care in the ED.  Some ED evaluations and interventions may be delayed as a result of limited staffing during and after the pandemic.*  Note:  This document was prepared using Dragon voice recognition software and may include unintentional dictation errors.   Hinda Kehr, MD 06/30/20 [phone]

## 2020-06-30 NOTE — ED Notes (Signed)
Pt calls son at this time to come and pick her up for DC. Will remain in room until son arrives

## 2020-06-30 NOTE — Telephone Encounter (Signed)
Patient was discharged from [facility] ED early this morning with lower back pain and right hip pain that began Sunday.See ED note for MRI results. Denies injury/fall.Denies urinary retention and the pain does not radiate to her chest/neck/arms. She was given prescriptions for oxycodone-acetaminophen 5-325 mg 2 tabs q6h for severe pain-#16tabs. And lidocaine 5% patch #10.  Stated the oxycodone-acetaminophen is tolerable but causes N/V. She is requesting an anti-nausea medication to take with the pain medication. She has a hospital follow-up visit this Friday, Oct.18 with pcp.  Pharmacy [facility] [place].  Reason for Disposition  [1] MODERATE back pain (e.g., interferes with normal activities) AND [2] present > 3 days  Answer Assessment - Initial Assessment Questions 1. ONSET: "When did the pain begin?"       2. LOCATION: "Where does it hurt?" (upper, mid or lower back)     Lower back and right hip pain 3. SEVERITY: "How bad is the pain?"  (e.g., Scale 1-10; mild, moderate, or severe)   - MILD (1-3): doesn't interfere with normal activities    - MODERATE (4-7): interferes with normal activities or awakens from sleep    - SEVERE (8-10): excruciating pain, unable to do any normal activities     severe 4. PATTERN: "Is the pain constant?" (e.g., yes, no; constant, intermittent)      constant 5. RADIATION: "Does the pain shoot into your legs or elsewhere?"    no 6. CAUSE:  "What do you think is causing the back pain?"      Diagnosed in ED this morning with radiculopathy 7. BACK OVERUSE:  "Any recent lifting of heavy objects, strenuous work or exercise?"     no 8. MEDICATIONS: "What have you taken so far for the pain?" (e.g., nothing, acetaminophen, NSAIDS)     Nothing since leaving the hospital. 9. NEUROLOGIC SYMPTOMS: "Do you have any weakness, numbness, or problems with bowel/bladder control?"     No 10. OTHER SYMPTOMS: "Do you have any other symptoms?" (e.g., fever, abdominal pain, burning  with urination, blood in urine)      none 11. PREGNANCY: "Is there any chance you are pregnant?" (e.g., yes, no; LMP)      na  Protocols used: BACK PAIN-A-AH

## 2020-06-30 NOTE — ED Notes (Signed)
Pt provided with snacks at this time per request.

## 2020-06-30 NOTE — ED Notes (Signed)
Pt taken to MRI at this time

## 2020-06-30 NOTE — ED Notes (Signed)
Pt reports pain is "still there" but has had some relief with medications. Pt requesting hot drink- notified to wait for imaging results.

## 2020-07-03 DIAGNOSIS — H40033 Anatomical narrow angle, bilateral: Secondary | ICD-10-CM | POA: Diagnosis not present

## 2020-07-04 ENCOUNTER — Inpatient Hospital Stay: Payer: PPO | Admitting: Family Medicine

## 2020-07-08 ENCOUNTER — Telehealth: Payer: Self-pay

## 2020-07-08 NOTE — Telephone Encounter (Signed)
LMTCB  07/08/2020.  PEC please schedule pt when she calls back.   Thanks,   -Mickel Baas

## 2020-07-08 NOTE — Telephone Encounter (Signed)
Copied from [place] [id number]. Topic: General - Other >> Jul 04, 2020  9:26 AM Rainey Pines A wrote: Patient wants to be worked in sooner than 11/4 to see Dr. B for her hospital follow up. Patient called to make her appt a phone appt this morning 10 min before appt time and stated that she was in too much pain to set up mychart. Advised patient of next available for hospital follow up with Dr. Jacinto Reap, Patient stated that she cannot wait that long and wants to speak with her nurse about her back pain. Please advise

## 2020-07-10 ENCOUNTER — Ambulatory Visit: Payer: Self-pay | Admitting: *Deleted

## 2020-07-10 NOTE — Chronic Care Management (AMB) (Signed)
Patient's CCM status changed to previously enrolled.     Tara Scott, [place] Worker  [facility] Care Management [phone]

## 2020-07-29 ENCOUNTER — Other Ambulatory Visit: Payer: Self-pay

## 2020-07-29 ENCOUNTER — Encounter: Payer: Self-pay | Admitting: Family Medicine

## 2020-07-29 ENCOUNTER — Ambulatory Visit (INDEPENDENT_AMBULATORY_CARE_PROVIDER_SITE_OTHER): Payer: PPO | Admitting: Family Medicine

## 2020-07-29 VITALS — BP 129/69 | HR 68 | Temp 98.3°F | Resp 16 | Ht 62.0 in | Wt 134.0 lb

## 2020-07-29 DIAGNOSIS — G4700 Insomnia, unspecified: Secondary | ICD-10-CM

## 2020-07-29 DIAGNOSIS — M545 Low back pain, unspecified: Secondary | ICD-10-CM

## 2020-07-29 DIAGNOSIS — G7100 Muscular dystrophy, unspecified: Secondary | ICD-10-CM | POA: Diagnosis not present

## 2020-07-29 DIAGNOSIS — Z23 Encounter for immunization: Secondary | ICD-10-CM | POA: Diagnosis not present

## 2020-07-29 MED ORDER — GABAPENTIN 300 MG PO CAPS: 300.0000 mg | ORAL_CAPSULE | Freq: Every day | ORAL | 3 refills | Status: DC

## 2020-07-29 NOTE — Progress Notes (Signed)
Established patient visit   Patient: Tara Scott   DOB: 1961-09-03   59 y.o. Female  MRN: [id number] Visit Date: 07/29/2020  Today's healthcare provider: Lavon Paganini, MD   Chief Complaint  Patient presents with  . Follow-up   Subjective    HPI  Follow up ER visit  Patient was seen in ER for back pain on 06/29/2020. She was treated for acute right-sided low back pain without sciatica.  H/o form of muscular dystrophy (central cord myopathy) for which she has seen specialist at [facility] in the past Treatment for this included labs and pain medications. She reports fair compliance with treatment. She reports this condition is Unchanged. Still hurts daily, but not as severe Worse on R side than L Near SI joint. Radiates around to R lateral hip or to L lower back.  MRI L spine showed: 1. Shallow left foraminal to extraforaminal disc protrusion at L2-3, contacting and potentially irritating the exiting left L2 nerve root. 2. Mild to moderate facet hypertrophy at L3-4 through L5-S1. 3. No other significant disc pathology within the lumbar spine. No significant stenosis or evidence for neural impingement. No cord compression.  She missed her NSG appt but she is interested in a referral at this time.  -----------------------------------------------------------------------------------------   Patient Active Problem List   Diagnosis Date Noted  . Postablative hypothyroidism 09/04/2019  . Tobacco use disorder 09/04/2019  . Benign essential tremor 12/26/2015  . Anxiety 03/14/2015  . Central core disease ([place]) 03/14/2015  . Dizziness 03/14/2015  . MD (muscular dystrophy) ([place]) 03/14/2015  . Avitaminosis D 04/16/2010  . Goiter, nontoxic, multinodular 11/04/2009  . Absence of menstruation 08/18/2009  . Acid reflux 12/16/2008  . Cannot sleep 11/17/2008  . Adnexal pain 10/04/2007  . MDD (major depressive disorder) 09/01/2007   Past Surgical History:  Procedure Laterality Date   . ADENOIDECTOMY    . CESAREAN SECTION    . COLONOSCOPY WITH PROPOFOL N/A 03/08/2016   Procedure: COLONOSCOPY WITH PROPOFOL;  Surgeon: Manya Silvas, MD;  Location: [facility] ENDOSCOPY;  Service: Endoscopy;  Laterality: N/A;  . LAPAROTOMY  1984   OVARIAN CYST   Social History   Tobacco Use  . Smoking status: Current Every Day Smoker    Packs/day: 0.75    Years: 40.00    Pack years: 30.00    Types: Cigarettes  . Smokeless tobacco: Never Used  Vaping Use  . Vaping Use: Never used  Substance Use Topics  . Alcohol use: No    Alcohol/week: 0.0 standard drinks  . Drug use: No   Allergies  Allergen Reactions  . Anesthetics, Halogenated Other (See Comments)    Other reaction(s): Other (See Comments)  . Ciprofloxacin     swelling  . Methimazole     hives  . Oxycodone-Acetaminophen Nausea And Vomiting  . Penicillins Other (See Comments)    RXN AS A CHILD       Medications: Outpatient Medications Prior to Visit  Medication Sig  . citalopram (CELEXA) 40 MG tablet TAKE 1 TABLET BY MOUTH EVERY DAY FOR DEPRESSION  . levothyroxine (SYNTHROID) 100 MCG tablet TAKE 1 TABLET BY MOUTH DAILY BEFORE BREAKFAST.  [name] traZODone (DESYREL) 50 MG tablet TAKE 1/2 TO 1 TABLET BY MOUTH AT BEDTIME AS NEEDED FOR SLEEP  . [DISCONTINUED] ALPRAZolam (XANAX) 0.5 MG tablet Take 1 tablet (0.5 mg total) by mouth daily as needed. (Patient not taking: Reported on 07/29/2020)  . [DISCONTINUED] lidocaine (LIDODERM) 5 % Place 1 patch onto the  skin every 12 (twelve) hours. Remove & Discard patch within 12 hours or as directed by MD.  [place] Proud the patch off for 12 hours before applying a new one. (Patient not taking: Reported on 07/29/2020)  . [DISCONTINUED] meloxicam (MOBIC) 7.5 MG tablet Take 1 tablet (7.5 mg total) by mouth daily. (Patient not taking: Reported on 07/29/2020)  . [DISCONTINUED] ondansetron (ZOFRAN) 4 MG tablet Take 1 tablet (4 mg total) by mouth every 8 (eight) hours as needed for nausea or vomiting. (Patient  not taking: Reported on 07/29/2020)  . [DISCONTINUED] oxyCODONE-acetaminophen (PERCOCET) 5-325 MG tablet Take 2 tablets by mouth every 6 (six) hours as needed for severe pain. (Patient not taking: Reported on 07/29/2020)   No facility-administered medications prior to visit.    Review of Systems  Constitutional: Positive for activity change. Negative for fatigue.  Respiratory: Negative for chest tightness, shortness of breath and wheezing.   Cardiovascular: Negative for chest pain and palpitations.  Musculoskeletal: Positive for back pain and myalgias.  Psychiatric/Behavioral: Positive for sleep disturbance. The patient is nervous/anxious.     Last CBC Lab Results  Component Value Date   WBC 5.7 06/30/2020   HGB 12.8 06/30/2020   HCT 37.5 06/30/2020   MCV 96.2 06/30/2020   MCH 32.8 06/30/2020   RDW 13.0 06/30/2020   PLT 241 06/30/2020      Objective    BP 129/69 (BP Location: Left Arm, Patient Position: Sitting, Cuff Size: Normal)   Pulse 68   Temp 98.3 F (36.8 C) (Oral)   Resp 16   Ht 5\' 2"  (1.575 m)   Wt 134 lb (60.8 kg)   BMI 24.51 kg/m  BP Readings from Last 3 Encounters:  07/29/20 129/69  06/30/20 126/69  03/19/20 128/68   Wt Readings from Last 3 Encounters:  07/29/20 134 lb (60.8 kg)  06/29/20 130 lb (59 kg)  03/19/20 131 lb (59.4 kg)      Physical Exam Vitals reviewed.  Constitutional:      General: She is not in acute distress.    Appearance: Normal appearance. She is well-developed. She is not diaphoretic.  HENT:     Head: Normocephalic and atraumatic.  Eyes:     General: No scleral icterus.    Conjunctiva/sclera: Conjunctivae normal.  Neck:     Thyroid: No thyromegaly.  Cardiovascular:     Rate and Rhythm: Normal rate and regular rhythm.     Pulses: Normal pulses.     Heart sounds: Normal heart sounds. No murmur heard.   Pulmonary:     Effort: Pulmonary effort is normal. No respiratory distress.     Breath sounds: Normal breath sounds. No  wheezing, rhonchi or rales.  Musculoskeletal:     Cervical back: Neck supple.     Right lower leg: No edema.     Left lower leg: No edema.     Comments: Back: No midline TTP, ROM grossly intact.  Negative SLR bilaterally.  Lymphadenopathy:     Cervical: No cervical adenopathy.  Skin:    General: Skin is warm and dry.     Findings: No rash.  Neurological:     Mental Status: She is alert and oriented to person, place, and time. Mental status is at baseline.     Sensory: No sensory deficit.     Motor: Weakness (bilateral LE weakness at baseline, symmetric) present.     Gait: Gait normal.  Psychiatric:        Mood and Affect: Mood normal.  Behavior: Behavior normal.       No results found for any visits on 07/29/20.  Assessment & Plan     1. Acute right-sided low back pain without sciatica - ongoing -See MRI findings above-reviewed with patient -Though MRI shows possible disc bulging pressing on left L2, her symptoms are right sided -Her neuro exam is intact at baseline with chronic lower extremity weakness is symmetric -She did not have any improvement with meloxicam, opioids-which she is discontinued -Trial of gabapentin 300 mg nightly -If patient tolerates well and is getting some improvement, can increase to 3 times daily dosing -Discussed possible side effects -Discussed return precautions -She was previously referred to neurosurgery and missed the appointment, so new referral was placed today - Ambulatory referral to Neurosurgery  2. Insomnia, unspecified type - may improve with gabapentin for above  3. MD (muscular dystrophy) ([place]) - poor biomechanics likely contribute to back pain - chronic and stable LE weakness - Ambulatory referral to Neurosurgery  4. Need for influenza vaccination - Flu Vaccine QUAD 36+ mos IM    Return if symptoms worsen or fail to improve.      I, Lavon Paganini, MD, have reviewed all documentation for this visit. The  documentation on 07/29/20 for the exam, diagnosis, procedures, and orders are all accurate and complete.   Arelie Kuzel, Dionne Bucy, MD, MPH [facility]

## 2020-07-29 NOTE — Patient Instructions (Signed)
Acute Back Pain, Adult Acute back pain is sudden and usually short-lived. It is often caused by an injury to the muscles and tissues in the back. The injury may result from:  A muscle or ligament getting overstretched or torn (strained). Ligaments are tissues that connect bones to each other. Lifting something improperly can cause a back strain.  Wear and tear (degeneration) of the spinal disks. Spinal disks are circular tissue that provides cushioning between the bones of the spine (vertebrae).  Twisting motions, such as while playing sports or doing yard work.  A hit to the back.  Arthritis. You may have a physical exam, lab tests, and imaging tests to find the cause of your pain. Acute back pain usually goes away with rest and home care. Follow these instructions at home: Managing pain, stiffness, and swelling  Take over-the-counter and prescription medicines only as told by your health care provider.  Your health care provider may recommend applying ice during the first 24-48 hours after your pain starts. To do this: ? Put ice in a plastic bag. ? Place a towel between your skin and the bag. ? Leave the ice on for 20 minutes, 2-3 times a day.  If directed, apply heat to the affected area as often as told by your health care provider. Use the heat source that your health care provider recommends, such as a moist heat pack or a heating pad. ? Place a towel between your skin and the heat source. ? Leave the heat on for 20-30 minutes. ? Remove the heat if your skin turns bright red. This is especially important if you are unable to feel pain, heat, or cold. You have a greater risk of getting burned. Activity   Do not stay in bed. Staying in bed for more than 1-2 days can delay your recovery.  Sit up and stand up straight. Avoid leaning forward when you sit, or hunching over when you stand. ? If you work at a desk, sit close to it so you do not need to lean over. Keep your chin tucked  in. Keep your neck drawn back, and keep your elbows bent at a right angle. Your arms should look like the letter "L." ? Sit high and close to the steering wheel when you drive. Add lower back (lumbar) support to your car seat, if needed.  Take short walks on even surfaces as soon as you are able. Try to increase the length of time you walk each day.  Do not sit, drive, or stand in one place for more than 30 minutes at a time. Sitting or standing for long periods of time can put stress on your back.  Do not drive or use heavy machinery while taking prescription pain medicine.  Use proper lifting techniques. When you bend and lift, use positions that put less stress on your back: ? Bend your knees. ? Keep the load close to your body. ? Avoid twisting.  Exercise regularly as told by your health care provider. Exercising helps your back heal faster and helps prevent back injuries by keeping muscles strong and flexible.  Work with a physical therapist to make a safe exercise program, as recommended by your health care provider. Do any exercises as told by your physical therapist. Lifestyle  Maintain a healthy weight. Extra weight puts stress on your back and makes it difficult to have good posture.  Avoid activities or situations that make you feel anxious or stressed. Stress and anxiety increase muscle   tension and can make back pain worse. Learn ways to manage anxiety and stress, such as through exercise. General instructions  Sleep on a firm mattress in a comfortable position. Try lying on your side with your knees slightly bent. If you lie on your back, put a pillow under your knees.  Follow your treatment plan as told by your health care provider. This may include: ? Cognitive or behavioral therapy. ? Acupuncture or massage therapy. ? Meditation or yoga. Contact a health care provider if:  You have pain that is not relieved with rest or medicine.  You have increasing pain going down  into your legs or buttocks.  Your pain does not improve after 2 weeks.  You have pain at night.  You lose weight without trying.  You have a fever or chills. Get help right away if:  You develop new bowel or bladder control problems.  You have unusual weakness or numbness in your arms or legs.  You develop nausea or vomiting.  You develop abdominal pain.  You feel faint. Summary  Acute back pain is sudden and usually short-lived.  Use proper lifting techniques. When you bend and lift, use positions that put less stress on your back.  Take over-the-counter and prescription medicines and apply heat or ice as directed by your health care provider. This information is not intended to replace advice given to you by your health care provider. Make sure you discuss any questions you have with your health care provider. Document Revised: 12/26/2018 Document Reviewed: 04/20/2017 Elsevier Patient Education  2020 Elsevier Inc.  

## 2020-08-03 ENCOUNTER — Other Ambulatory Visit: Payer: Self-pay | Admitting: Family Medicine

## 2020-08-03 DIAGNOSIS — F419 Anxiety disorder, unspecified: Secondary | ICD-10-CM

## 2020-08-03 NOTE — Telephone Encounter (Signed)
Prescription request is for citalopram HBR- please review. To make sure medication name is accurate. HBR was not noted on previous refills

## 2020-08-08 DIAGNOSIS — M1611 Unilateral primary osteoarthritis, right hip: Secondary | ICD-10-CM | POA: Diagnosis not present

## 2020-08-08 DIAGNOSIS — M5416 Radiculopathy, lumbar region: Secondary | ICD-10-CM | POA: Diagnosis not present

## 2020-08-08 DIAGNOSIS — M545 Low back pain, unspecified: Secondary | ICD-10-CM | POA: Diagnosis not present

## 2020-08-08 DIAGNOSIS — I878 Other specified disorders of veins: Secondary | ICD-10-CM | POA: Diagnosis not present

## 2020-08-08 DIAGNOSIS — M25551 Pain in right hip: Secondary | ICD-10-CM | POA: Diagnosis not present

## 2020-09-25 ENCOUNTER — Other Ambulatory Visit: Payer: Self-pay | Admitting: Family Medicine

## 2020-10-01 ENCOUNTER — Ambulatory Visit (INDEPENDENT_AMBULATORY_CARE_PROVIDER_SITE_OTHER): Payer: PPO | Admitting: Physician Assistant

## 2020-10-01 ENCOUNTER — Other Ambulatory Visit: Payer: Self-pay

## 2020-10-01 VITALS — BP 149/62 | HR 68 | Temp 98.0°F | Wt 135.9 lb

## 2020-10-01 DIAGNOSIS — E890 Postprocedural hypothyroidism: Secondary | ICD-10-CM

## 2020-10-01 DIAGNOSIS — G4700 Insomnia, unspecified: Secondary | ICD-10-CM | POA: Diagnosis not present

## 2020-10-01 DIAGNOSIS — F419 Anxiety disorder, unspecified: Secondary | ICD-10-CM

## 2020-10-01 DIAGNOSIS — E559 Vitamin D deficiency, unspecified: Secondary | ICD-10-CM

## 2020-10-01 DIAGNOSIS — R5383 Other fatigue: Secondary | ICD-10-CM | POA: Diagnosis not present

## 2020-10-01 DIAGNOSIS — R252 Cramp and spasm: Secondary | ICD-10-CM

## 2020-10-01 MED ORDER — ALPRAZOLAM 0.5 MG PO TABS: 0.5000 mg | ORAL_TABLET | Freq: Every day | ORAL | 0 refills | Status: DC | PRN

## 2020-10-01 MED ORDER — BUSPIRONE HCL 7.5 MG PO TABS: 7.5000 mg | ORAL_TABLET | Freq: Two times a day (BID) | ORAL | 0 refills | Status: DC

## 2020-10-01 NOTE — Progress Notes (Signed)
Established patient visit   Patient: Tara Scott   DOB: 1961-06-16   60 y.o. Female  MRN: [id number] Visit Date: 10/01/2020  Today's healthcare provider: Trinna Post, PA-C   No chief complaint on file.  Subjective    HPI   Postablative hypothyroidism: currently on synthroid 100 mcg daily after treatment for hyperthyroidism. She wants her TSH checked and also her vitamins to see if this is why she feels the way she does.  Depression and anxiety: Currently on celexa for 40 mg. Reports increased anxiety, racing thoughts. Her twin sister died 7 months ago and she continues to grieve thoughts. She is worried about something happening with her son, thinking obsessively about her sister and husband passing. She was seen for this six months ago and offered counseling but declined at the time.   BP Readings from Last 3 Encounters:  10/01/20 (!) 149/62  07/29/20 129/69  06/30/20 126/69   Insomnia: She has had this issue for at least a year. She did not take trazadone because she was nervous of the side effects.   Medications: Outpatient Medications Prior to Visit  Medication Sig  . citalopram (CELEXA) 40 MG tablet TAKE 1 TABLET BY MOUTH EVERY DAY FOR DEPRESSION  . levothyroxine (SYNTHROID) 100 MCG tablet TAKE 1 TABLET BY MOUTH DAILY BEFORE BREAKFAST.  [name] gabapentin (NEURONTIN) 300 MG capsule Take 1 capsule (300 mg total) by mouth at bedtime. (Patient not taking: Reported on 10/01/2020)  . traZODone (DESYREL) 50 MG tablet TAKE 1/2 TO 1 TABLET BY MOUTH AT BEDTIME AS NEEDED FOR SLEEP (Patient not taking: Reported on 10/01/2020)   No facility-administered medications prior to visit.    Review of Systems  Constitutional: Negative.   Respiratory: Negative.   Psychiatric/Behavioral: Positive for sleep disturbance. The patient is nervous/anxious.        Objective    BP (!) 149/62 (BP Location: Left Arm, Patient Position: Sitting, Cuff Size: Large)   Pulse 68   Temp 98 F  (36.7 C) (Oral)   Wt 135 lb 14.4 oz (61.6 kg)   SpO2 100%   BMI 24.86 kg/m    Physical Exam Constitutional:      Appearance: Normal appearance. She is normal weight.  Cardiovascular:     Rate and Rhythm: Normal rate and regular rhythm.     Heart sounds: Normal heart sounds.  Pulmonary:     Effort: Pulmonary effort is normal.     Breath sounds: Normal breath sounds.  Skin:    General: Skin is warm and dry.  Neurological:     General: No focal deficit present.     Mental Status: She is alert and oriented to person, place, and time. Mental status is at baseline.  Psychiatric:        Mood and Affect: Mood is anxious and depressed.        Behavior: Behavior normal.       No results found for any visits on 10/01/20.  Assessment & Plan    1. Anxiety  Suspect her symptoms are likely 2/2 anxiety. She requests a refill of Xanax and I have reiterated the fact that xanax is not intended for long term management of anxiety. I have refilled a small course and advised she start buspar for additional management of anxiety. Also will place referral to counseling. She will need to follow up with her PCP.   - AMB Referral to [facility] [place] - busPIRone (BUSPAR) 7.5 MG tablet; Take 1  tablet (7.5 mg total) by mouth 2 (two) times daily.  Dispense: 180 tablet; Refill: 0 - ALPRAZolam (XANAX) 0.5 MG tablet; Take 1 tablet (0.5 mg total) by mouth daily as needed.  Dispense: 10 tablet; Refill: 0  2. Other fatigue  - CBC with Differential  3. Postablative hypothyroidism  - TSH  4. Insomnia, unspecified type   5. Leg cramps  - Magnesium  6. Vitamin D deficiency  - Vitamin D (25 hydroxy)   No follow-ups on file.      ITrinna Post, PA-C, have reviewed all documentation for this visit. The documentation on 10/08/20 for the exam, diagnosis, procedures, and orders are all accurate and complete.  The entirety of the information documented in the History of Present  Illness, Review of Systems and Physical Exam were personally obtained by me. Portions of this information were initially documented by [facility] and reviewed by me for thoroughness and accuracy.     Paulene Floor  [facility] [phone] (phone) [phone] (fax)  [place]

## 2020-10-02 ENCOUNTER — Telehealth: Payer: Self-pay | Admitting: *Deleted

## 2020-10-02 NOTE — Chronic Care Management (AMB) (Signed)
  Chronic Care Management   Outreach Note  10/02/2020 Name: [name] MRN: [id number] DOB: [birth year]  Tara Scott is a 60 y.o. year old female who is a primary care patient of Brita Romp, Dionne Bucy, MD. I reached out to Aleda Grana by phone today in response to a referral sent by Ms. Elby Showers [place]'s PCP, [name], MD.   An unsuccessful telephone outreach was attempted today. The patient was referred to the case management team for assistance with care management and care coordination.   Follow Up Plan: A HIPAA compliant phone message was left for the patient providing contact information and requesting a return call. The care management team will reach out to the patient again over the next 7 days. If patient returns call to provider office, please advise to call [place] at [phone].  [place] Management

## 2020-10-02 NOTE — Chronic Care Management (AMB) (Signed)
  Chronic Care Management   Note  10/02/2020 Name: Tara Scott MRN: [id number] DOB: [birth year]  Tara Scott is a 60 y.o. year old female who is a primary care patient of Brita Romp, Dionne Bucy, MD. I reached out to Aleda Grana by phone today in response to a referral sent by Tara Scott's PCP, [name], MD.  Tara Scott was given information about Chronic Care Management services today including:  1. CCM service includes personalized support from designated clinical staff supervised by her physician, including individualized plan of care and coordination with other care providers 2. 24/7 contact phone numbers for assistance for urgent and routine care needs. 3. Service will only be billed when office clinical staff spend 20 minutes or more in a month to coordinate care. 4. Only one practitioner may furnish and bill the service in a calendar month. 5. The patient may stop CCM services at any time (effective at the end of the month) by phone call to the office staff. 6. The patient will be responsible for cost sharing (co-pay) of up to 20% of the service fee (after annual deductible is met).  Patient agreed to services and verbal consent obtained.   Follow up plan: Telephone appointment with care management team member scheduled for:10/14/2020  [place] Management

## 2020-10-03 LAB — CBC WITH DIFFERENTIAL/PLATELET
Basophils Absolute: 0 10*3/uL (ref 0.0–0.2)
Basos: 1 %
EOS (ABSOLUTE): 0 10*3/uL (ref 0.0–0.4)
Eos: 1 %
Hematocrit: 41.7 % (ref 34.0–46.6)
Hemoglobin: 14.4 g/dL (ref 11.1–15.9)
Immature Grans (Abs): 0 10*3/uL (ref 0.0–0.1)
Immature Granulocytes: 0 %
Lymphocytes Absolute: 1.2 10*3/uL (ref 0.7–3.1)
Lymphs: 25 %
MCH: 32.8 pg (ref 26.6–33.0)
MCHC: 34.5 g/dL (ref 31.5–35.7)
MCV: 95 fL (ref 79–97)
Monocytes Absolute: 0.4 10*3/uL (ref 0.1–0.9)
Monocytes: 9 %
Neutrophils Absolute: 3.1 10*3/uL (ref 1.4–7.0)
Neutrophils: 64 %
Platelets: 270 10*3/uL (ref 150–450)
RBC: 4.39 x10E6/uL (ref 3.77–5.28)
RDW: 11.9 % (ref 11.7–15.4)
WBC: 4.7 10*3/uL (ref 3.4–10.8)

## 2020-10-03 LAB — MAGNESIUM: Magnesium: 2.1 mg/dL (ref 1.6–2.3)

## 2020-10-03 LAB — TSH: TSH: 3.95 u[IU]/mL (ref 0.450–4.500)

## 2020-10-03 LAB — VITAMIN D 25 HYDROXY (VIT D DEFICIENCY, FRACTURES): Vit D, 25-Hydroxy: 17.2 ng/mL — ABNORMAL LOW (ref 30.0–100.0)

## 2020-10-07 ENCOUNTER — Telehealth: Payer: Self-pay

## 2020-10-07 NOTE — Telephone Encounter (Signed)
-----   Message from Trinna Post, [place] sent at 10/06/2020 12:55 PM EST ----- Thyroid levels normal. Magnesium normal. Vitamin D low and can take 1000 IU daily. Blood counts normal. She has follow up arranged with PCP.

## 2020-10-07 NOTE — Telephone Encounter (Signed)
Patient was advised and she has a follow-up on 11/03/2020.

## 2020-10-14 ENCOUNTER — Telehealth: Payer: PPO | Admitting: *Deleted

## 2020-10-15 ENCOUNTER — Ambulatory Visit: Payer: PPO | Admitting: *Deleted

## 2020-10-15 DIAGNOSIS — F4329 Adjustment disorder with other symptoms: Secondary | ICD-10-CM

## 2020-10-15 DIAGNOSIS — F329 Major depressive disorder, single episode, unspecified: Secondary | ICD-10-CM

## 2020-10-15 DIAGNOSIS — F419 Anxiety disorder, unspecified: Secondary | ICD-10-CM

## 2020-10-15 DIAGNOSIS — F4381 Prolonged grief disorder: Secondary | ICD-10-CM

## 2020-10-15 NOTE — Chronic Care Management (AMB) (Addendum)
Chronic Care Management    Clinical Social Work Note  10/15/2020 Name: Tara Scott MRN: [id number] DOB: Feb 21, 1961  Tara Scott is a 60 y.o. year old female who is a primary care patient of Bacigalupo, Dionne Bucy, MD. The CCM team was consulted to assist the patient with chronic disease management and/or care coordination needs related to: Mental Health Counseling and Resources.   Engaged with patient by telephone for initial visit in response to provider referral for social work chronic care management and care coordination services.   Consent to Services:  The patient was given the following information about Chronic Care Management services today, agreed to services, and gave verbal consent: 1. CCM service includes personalized support from designated clinical staff supervised by the primary care provider, including individualized plan of care and coordination with other care providers 2. 24/7 contact phone numbers for assistance for urgent and routine care needs. 3. Service will only be billed when office clinical staff spend 20 minutes or more in a month to coordinate care. 4. Only one practitioner may furnish and bill the service in a calendar month. 5.The patient may stop CCM services at any time (effective at the end of the month) by phone call to the office staff. 6. The patient will be responsible for cost sharing (co-pay) of up to 20% of the service fee (after annual deductible is met). Patient agreed to services and consent obtained.  Patient agreed to services and consent obtained.   Assessment: Review of patient past medical history, allergies, medications, and health status, including review of relevant consultants reports was performed today as part of a comprehensive evaluation and provision of chronic care management and care coordination services.     SDOH (Social Determinants of Health) assessments and interventions performed:  yes  Advanced Directives Status: Not addressed  in this encounter.  CCM Care Plan  Allergies  Allergen Reactions  . Anesthetics, Halogenated Other (See Comments)    Other reaction(s): Other (See Comments)  . Ciprofloxacin     swelling  . Methimazole     hives  . Oxycodone-Acetaminophen Nausea And Vomiting  . Penicillins Other (See Comments)    RXN AS A CHILD    Outpatient Encounter Medications as of 10/15/2020  Medication Sig  . ALPRAZolam (XANAX) 0.5 MG tablet Take 1 tablet (0.5 mg total) by mouth daily as needed.  . busPIRone (BUSPAR) 7.5 MG tablet Take 1 tablet (7.5 mg total) by mouth 2 (two) times daily.  . citalopram (CELEXA) 40 MG tablet TAKE 1 TABLET BY MOUTH EVERY DAY FOR DEPRESSION  . gabapentin (NEURONTIN) 300 MG capsule Take 1 capsule (300 mg total) by mouth at bedtime. (Patient not taking: Reported on 10/01/2020)  . levothyroxine (SYNTHROID) 100 MCG tablet TAKE 1 TABLET BY MOUTH DAILY BEFORE BREAKFAST.  [name] traZODone (DESYREL) 50 MG tablet TAKE 1/2 TO 1 TABLET BY MOUTH AT BEDTIME AS NEEDED FOR SLEEP (Patient not taking: Reported on 10/01/2020)   No facility-administered encounter medications on file as of 10/15/2020.    Patient Active Problem List   Diagnosis Date Noted  . Postablative hypothyroidism 09/04/2019  . Tobacco use disorder 09/04/2019  . Benign essential tremor 12/26/2015  . Anxiety 03/14/2015  . 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 Dizziness 03/14/2015  . MD (muscular dystrophy) ([place]) 03/14/2015  . Avitaminosis D 04/16/2010  . Goiter, nontoxic, multinodular 11/04/2009  . Absence of menstruation 08/18/2009  . Acid reflux 12/16/2008  . Cannot sleep 11/17/2008  . Adnexal pain 10/04/2007  .  MDD (major depressive disorder) 09/01/2007    Conditions to be addressed/monitored: Anxiety and Depression; Mental Health Concerns   Care Plan : Depression (Adult)  Updates made by [name], LCSW since 10/15/2020 12:00 AM    Problem: Symptoms (Depression)     Long-Range Goal: Symptoms Monitored and  Managed   Start Date: 10/15/2020  Expected End Date: 03/16/2021  This Visit's Progress: On track  Priority: High  Note:   Current Barriers:  [name] Mental Health Concerns  . Unable to independently cope with recent loss of sister and spouse  Clinical Social Work Clinical Goal(s):  [name] Over the next 90 days, patient will follow up with with a mental health therapist for ongoing mental health counseling* as directed by SW  Interventions: . 1:1 collaboration with Brita Romp, Dionne Bucy, MD regarding development and update of comprehensive plan of care as evidenced by provider attestation and co-signature . Inter-disciplinary care team collaboration (see longitudinal plan of care) . Patient interviewed and appropriate assessments performed . Discussed patient's history of loss-mother, sister, spouse  Explored treatment options in an atmosphere of hope and optimism; support the belief that recovery is possible.   Facilitated and advocated for ongoing mental health treatment   Explored patient's current network of support and encouraged continued contact . Provided patient with information about anticipated mental health follow up and will make referrals as appropriate . Discussed plans with patient for ongoing care management follow up and provided patient with direct contact information for care management team   Patient Goals/Self-Care Activities Over the next 90 days, patient will:   - Patient will self administer medications as prescribed Patient will attend all scheduled provider appointments Patient will continue to perform ADL's independently Patient will continue to perform IADL's independently Patient will call provider office for new concerns or questions  Follow up Plan: SW will follow up with patient by phone over the next 5-7 business days         Task: Alleviate Barriers to Depression Treatment   Due Date: 03/17/2020  Priority: Routine  Note:   Care Management Activities:     - depression screen reviewed - emotional support provided    Notes:       Follow Up Plan: SW will follow up with patient by phone over the next 7-14 business days       [place], [place] Worker  [facility] Care Management [phone]

## 2020-10-15 NOTE — Patient Instructions (Signed)
   Visit Information  Patient Care Plan: Depression (Adult)    Problem Identified: Symptoms (Depression)     Long-Range Goal: Symptoms Monitored and Managed   Start Date: 10/15/2020  Expected End Date: 03/16/2021  This Visit's Progress: On track  Priority: High  Note:   Current Barriers:  [name] Mental Health Concerns  . Unable to independently cope with recent loss of sister and spouse  Clinical Social Work Clinical Goal(s):  [name] Over the next 90 days, patient will follow up with with a mental health therapist for ongoing mental health counseling* as directed by SW  Interventions: . 1:1 collaboration with Brita Romp, Dionne Bucy, MD regarding development and update of comprehensive plan of care as evidenced by provider attestation and co-signature . Inter-disciplinary care team collaboration (see longitudinal plan of care) . Patient interviewed and appropriate assessments performed . Discussed patient's history of loss-mother, sister, spouse  Explored treatment options in an atmosphere of hope and optimism; support the belief that recovery is possible.   Facilitated and advocated for ongoing mental health treatment   Explored patient's current network of support and encouraged continued contact . Provided patient with information about anticipated mental health follow up and will make referrals as appropriate . Discussed plans with patient for ongoing care management follow up and provided patient with direct contact information for care management team   Patient Goals/Self-Care Activities Over the next 90 days, patient will:   - Patient will self administer medications as prescribed Patient will attend all scheduled provider appointments Patient will continue to perform ADL's independently Patient will continue to perform IADL's independently Patient will call provider office for new concerns or questions  Follow up Plan: SW will follow up with patient by phone over the next 5-7  business days         Task: Alleviate Barriers to Depression Treatment   Due Date: 03/17/2020  Priority: Routine  Note:   Care Management Activities:    - depression screen reviewed - emotional support provided    Notes:      Ms. Ryden was given information about Care Management services today including:  1. Care Management services include personalized support from designated clinical staff supervised by her physician, including individualized plan of care and coordination with other care providers 2. 24/7 contact phone numbers for assistance for urgent and routine care needs. 3. The patient may stop CCM services at any time (effective at the end of the month) by phone call to the office staff.  Patient agreed to services and verbal consent obtained.   The patient verbalized understanding of instructions, educational materials, and care plan provided today and declined offer to receive copy of patient instructions, educational materials, and care plan.   Telephone follow up appointment with care management team member scheduled for:10/22/20   Elliot Gurney, [place] Worker  [facility] Care Management [phone]

## 2020-10-15 NOTE — Progress Notes (Signed)
Subjective:   Tara Scott is a 60 y.o. female who presents for Medicare Annual (Subsequent) preventive examination.  I connected with Tara Scott today by telephone and verified that I am speaking with the correct person using two identifiers. Location patient: home Location provider: work Persons participating in the virtual visit: patient, provider.   I discussed the limitations, risks, security and privacy concerns of performing an evaluation and management service by telephone and the availability of in person appointments. I also discussed with the patient that there may be a patient responsible charge related to this service. The patient expressed understanding and verbally consented to this telephonic visit.    Interactive audio and video telecommunications were attempted between this provider and patient, however failed, due to patient having technical difficulties OR patient did not have access to video capability.  We continued and completed visit with audio only.   Review of Systems    N/A        Objective:    There were no vitals filed for this visit. There is no height or weight on file to calculate BMI.  Advanced Directives 06/29/2020 02/02/2017 03/08/2016 05/30/2015  Does Patient Have a Medical Advance Directive? No No No No  Would patient like information on creating a medical advance directive? - Yes (ED - Information included in AVS) No - patient declined information -    Current Medications (verified) Outpatient Encounter Medications as of 10/16/2020  Medication Sig  . ALPRAZolam (XANAX) 0.5 MG tablet Take 1 tablet (0.5 mg total) by mouth daily as needed.  . busPIRone (BUSPAR) 7.5 MG tablet Take 1 tablet (7.5 mg total) by mouth 2 (two) times daily.  . citalopram (CELEXA) 40 MG tablet TAKE 1 TABLET BY MOUTH EVERY DAY FOR DEPRESSION  . gabapentin (NEURONTIN) 300 MG capsule Take 1 capsule (300 mg total) by mouth at bedtime. (Patient not taking: Reported on  10/01/2020)  . levothyroxine (SYNTHROID) 100 MCG tablet TAKE 1 TABLET BY MOUTH DAILY BEFORE BREAKFAST.  [name] traZODone (DESYREL) 50 MG tablet TAKE 1/2 TO 1 TABLET BY MOUTH AT BEDTIME AS NEEDED FOR SLEEP (Patient not taking: Reported on 10/01/2020)   No facility-administered encounter medications on file as of 10/16/2020.    Allergies (verified) Anesthetics, halogenated; Ciprofloxacin; Methimazole; Oxycodone-acetaminophen; and Penicillins   History: Past Medical History:  Diagnosis Date  . Adverse effect of malignant hyperthermia 04/16/2010  . Anxiety   . 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 DVT (deep venous thrombosis) ([place])   . 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   . Malignant hyperthermia   . Toxic multinodular goiter    Past Surgical History:  Procedure Laterality Date  . ADENOIDECTOMY    . CESAREAN SECTION    . COLONOSCOPY WITH PROPOFOL N/A 03/08/2016   Procedure: COLONOSCOPY WITH PROPOFOL;  Surgeon: Manya Silvas, MD;  Location[facility] ENDOSCOPY;  Service: Endoscopy;  Laterality: N/A;  . LAPAROTOMY  1984   OVARIAN CYST   Family History  Problem Relation Age of Onset  . CVA Mother   . Hypercholesterolemia Mother   . Depression Mother   . Rheum arthritis Mother   . Deep vein thrombosis Mother   . Bladder Cancer Father   . Diverticulitis Father   . Pulmonary embolism Father   . Lung cancer Father   . Deep vein thrombosis Sister   .  Colon polyps Sister   . Atrial fibrillation Sister   . Muscular dystrophy Sister   . Cancer Sister        lung spreading all over  . Diabetes Son        type 2   Social History   Socioeconomic History  . Marital status: Widowed    Spouse name: Not on file  . Number of children: Not on file  . Years of education: Not on file  . Highest education level: Not on file   Occupational History  . Not on file  Tobacco Use  . Smoking status: Current Every Day Smoker    Packs/day: 0.75    Years: 40.00    Pack years: 30.00    Types: Cigarettes  . Smokeless tobacco: Never Used  Vaping Use  . Vaping Use: Never used  Substance and Sexual Activity  . Alcohol use: No    Alcohol/week: 0.0 standard drinks  . Drug use: No  . Sexual activity: Not on file  Other Topics Concern  . Not on file  Social History Narrative  . Not on file   Social Determinants of Health   Financial Resource Strain: Low Risk   . Difficulty of Paying Living Expenses: Not very hard  Food Insecurity: Food Insecurity Present  . Worried About Charity fundraiser in the Last Year: Sometimes true  . Ran Out of Food in the Last Year: Never true  Transportation Needs: No Transportation Needs  . Lack of Transportation (Medical): No  . Lack of Transportation (Non-Medical): No  Physical Activity: Not on file  Stress: Stress Concern Present  . Feeling of Stress : Rather much  Social Connections: Not on file    Tobacco Counseling Ready to quit: Not Answered Counseling given: Not Answered   Clinical Intake:                 Diabetic? No         Activities of Daily Living In your present state of health, do you have any difficulty performing the following activities: 10/01/2020  Hearing? N  Vision? Y  Difficulty concentrating or making decisions? Y  Walking or climbing stairs? Y  Dressing or bathing? N  Doing errands, shopping? Y  Some recent data might be hidden    Patient Care Team: Virginia Crews, MD as PCP - General (Family Medicine) Agapito Games as Consulting Physician (Optometry) [place], [place], [place] as Social Worker  Indicate any recent Medical Services you may have received from other than [facility] providers in the past year (date may be approximate).     Assessment:   This is a routine wellness examination for Tara Scott.  Hearing/Vision  screen No exam data present  Dietary issues and exercise activities discussed:    Goals    . Begin and Stick with Counseling-Depression     Timeframe:  Long-Range Goal Priority:  High Start Date:   10/15/20                          Expected End Date:  04/14/21                     Follow Up Date 10/22/2020    - check out counseling - keep 90 percent of counseling appointments - schedule counseling appointment    Why is this important?    Beating depression may take some time.   If you don't feel better right  away, don't give up on your treatment plan.    Notes:     . Increase water intake     Recommend increasing water intake to 4 glasses a day.      Depression Screen PHQ 2/9 Scores 10/01/2020 07/29/2020 09/04/2019 04/28/2018 10/05/2017 02/02/2017 12/26/2015  PHQ - 2 Score 6 4 5 6 5 6 4   PHQ- 9 Score 16 13 14 19 14 14 13     Fall Risk Fall Risk  10/01/2020 04/28/2018 10/05/2017 02/02/2017 12/26/2015  Falls in the past year? 1 Yes Yes Yes No  Number falls in past yr: 1 2 or more 2 or more 2 or more -  Injury with Fall? 0 No No No -  Risk for fall due to : History of fall(s) - - Other (Comment) -  Risk for fall due to: Comment - - - muscular dystrophy -  Follow up Falls evaluation completed;Education provided;Falls prevention discussed - - - -    FALL RISK PREVENTION PERTAINING TO THE HOME:  Any stairs in or around the home? No  If so, are there any without handrails? No  Home free of loose throw rugs in walkways, pet beds, electrical cords, etc? Yes  Adequate lighting in your home to reduce risk of falls? Yes   ASSISTIVE DEVICES UTILIZED TO PREVENT FALLS:  Life alert? No  Use of a cane, walker or w/c? Yes  Grab bars in the bathroom? Yes  Shower chair or bench in shower? No  Elevated toilet seat or a handicapped toilet? Yes    Cognitive Function:     6CIT Screen 02/02/2017  What Year? 0 points  What month? 0 points  What time? 0 points  Count back from 20 0 points   Months in reverse 0 points  Repeat phrase 0 points  Total Score 0    Immunizations Immunization History  Administered Date(s) Administered  . Influenza Split 07/22/2008, 08/18/2009, 07/27/2011, 08/02/2012  . Influenza,inj,Quad PF,6+ Mos 09/05/2013, 07/23/2016, 10/05/2017, 10/09/2018, 09/04/2019, 07/29/2020  . Pneumococcal Polysaccharide-23 07/27/2011    TDAP status: Due, Education has been provided regarding the importance of this vaccine. Advised may receive this vaccine at local pharmacy or Health Dept. Aware to provide a copy of the vaccination record if obtained from local pharmacy or Health Dept. Verbalized acceptance and understanding.  Flu Vaccine status: Up to date  Covid-19 vaccine status: Declined, Education has been provided regarding the importance of this vaccine but patient still declined. Advised may receive this vaccine at local pharmacy or Health Dept.or vaccine clinic. Aware to provide a copy of the vaccination record if obtained from local pharmacy or Health Dept. Verbalized acceptance and understanding.  Qualifies for Shingles Vaccine? Yes   Zostavax completed No   Shingrix Completed?: No.    Education has been provided regarding the importance of this vaccine. Patient has been advised to call insurance company to determine out of pocket expense if they have not yet received this vaccine. Advised may also receive vaccine at local pharmacy or Health Dept. Verbalized acceptance and understanding.  Screening Tests Health Maintenance  Topic Date Due  . COVID-19 Vaccine (1) Never done  . MAMMOGRAM  Never done  . PAP SMEAR-Modifier  10/05/2020  . TETANUS/TDAP  09/20/2026 (Originally 09/[birth year]/1979)  . COLONOSCOPY (Pts 45-77yrs Insurance coverage will need to be confirmed)  03/11/2021  . INFLUENZA VACCINE  Completed  . Hepatitis C Screening  Completed  . HIV Screening  Completed    Health Maintenance  Health Maintenance Due  Topic Date Due  . COVID-19 Vaccine (1)  Never done  . MAMMOGRAM  Never done  . PAP SMEAR-Modifier  10/05/2020    Colorectal cancer screening: Type of screening: Colonoscopy. Completed 03/11/16. Repeat every 5 years  Mammogram status: Ordered today. Pt provided with contact info and advised to call to schedule appt.    Lung Cancer Screening: (Low Dose CT Chest recommended if Age 52-80 years, 30 pack-year currently smoking OR have quit w/in 15years.) does qualify however completed this 11/12/19. Repeat yearly.   Additional Screening:  Hepatitis C Screening: Up to date  Vision Screening: Recommended annual ophthalmology exams for early detection of glaucoma and other disorders of the eye. Is the patient up to date with their annual eye exam?  Yes  Who is the provider or what is the name of the office in which the patient attends annual eye exams? Dr Neville Route If pt is not established with a provider, would they like to be referred to a provider to establish care? No .   Dental Screening: Recommended annual dental exams for proper oral hygiene  Community Resource Referral / Chronic Care Management: CRR required this visit?  No   CCM required this visit?  No      Plan:     I have personally reviewed and noted the following in the patient's chart:   . Medical and social history . Use of alcohol, tobacco or illicit drugs  . Current medications and supplements . Functional ability and status . Nutritional status . Physical activity . Advanced directives . List of other physicians . Hospitalizations, surgeries, and ER visits in previous 12 months . Vitals . Screenings to include cognitive, depression, and falls . Referrals and appointments  In addition, I have reviewed and discussed with patient certain preventive protocols, quality metrics, and best practice recommendations. A written personalized care plan for preventive services as well as general preventive health recommendations were provided to patient.      Shanessa Hodak [place], [place]   [phone]   Nurse Notes: Mammogram ordered today. Pt declined receiving a future Covid vaccine.

## 2020-10-16 ENCOUNTER — Other Ambulatory Visit: Payer: Self-pay

## 2020-10-16 ENCOUNTER — Ambulatory Visit (INDEPENDENT_AMBULATORY_CARE_PROVIDER_SITE_OTHER): Payer: PPO

## 2020-10-16 DIAGNOSIS — Z0000 Encounter for general adult medical examination without abnormal findings: Secondary | ICD-10-CM

## 2020-10-16 DIAGNOSIS — Z1231 Encounter for screening mammogram for malignant neoplasm of breast: Secondary | ICD-10-CM | POA: Diagnosis not present

## 2020-10-16 NOTE — Patient Instructions (Signed)
Ms. Tara Scott , Thank you for taking time to come for your Medicare Wellness Visit. I appreciate your ongoing commitment to your health goals. Please review the following plan we discussed and let me know if I can assist you in the future.   Screening recommendations/referrals: Colonoscopy: Up to date, due 02/2021 Mammogram: Ordered today. Please call [phone] to schedule your mammogram.  Recommended yearly ophthalmology/optometry visit for glaucoma screening and checkup Recommended yearly dental visit for hygiene and checkup  Vaccinations: Influenza vaccine: Done 07/29/20 Tdap vaccine: Currently due, declined receiving.  Shingles vaccine: Shingrix discussed. Please contact your pharmacy for coverage information.     Advanced directives: Advance directive discussed with you today. Even though you declined this today please call our office should you change your mind and we can give you the proper paperwork for you to fill out.  Conditions/risks identified: Fall risk preventatives and smoking cessation discussed today.   Next appointment: 11/03/20 @ 2:40 PM with Dr Brita Romp   Preventive Care 40-64 Years, Female Preventive care refers to lifestyle choices and visits with your health care provider that can promote health and wellness. What does preventive care include?  A yearly physical exam. This is also called an annual well check.  Dental exams once or twice a year.  Routine eye exams. Ask your health care provider how often you should have your eyes checked.  Personal lifestyle choices, including:  Daily care of your teeth and gums.  Regular physical activity.  Eating a healthy diet.  Avoiding tobacco and drug use.  Limiting alcohol use.  Practicing safe sex.  Taking low-dose aspirin daily starting at age 15.  Taking vitamin and mineral supplements as recommended by your health care provider. What happens during an annual well check? The services and screenings done  by your health care provider during your annual well check will depend on your age, overall health, lifestyle risk factors, and family history of disease. Counseling  Your health care provider may ask you questions about your:  Alcohol use.  Tobacco use.  Drug use.  Emotional well-being.  Home and relationship well-being.  Sexual activity.  Eating habits.  Work and work Statistician.  Method of birth control.  Menstrual cycle.  Pregnancy history. Screening  You may have the following tests or measurements:  Height, weight, and BMI.  Blood pressure.  Lipid and cholesterol levels. These may be checked every 5 years, or more frequently if you are over 28 years old.  Skin check.  Lung cancer screening. You may have this screening every year starting at age 86 if you have a 30-pack-year history of smoking and currently smoke or have quit within the past 15 years.  Fecal occult blood test (FOBT) of the stool. You may have this test every year starting at age 45.  Flexible sigmoidoscopy or colonoscopy. You may have a sigmoidoscopy every 5 years or a colonoscopy every 10 years starting at age 93.  Hepatitis C blood test.  Hepatitis B blood test.  Sexually transmitted disease (STD) testing.  Diabetes screening. This is done by checking your blood sugar (glucose) after you have not eaten for a while (fasting). You may have this done every 1-3 years.  Mammogram. This may be done every 1-2 years. Talk to your health care provider about when you should start having regular mammograms. This may depend on whether you have a family history of breast cancer.  BRCA-related cancer screening. This may be done if you have a family history of breast,  ovarian, tubal, or peritoneal cancers.  Pelvic exam and Pap test. This may be done every 3 years starting at age 58. Starting at age 54, this may be done every 5 years if you have a Pap test in combination with an HPV test.  Bone density  scan. This is done to screen for osteoporosis. You may have this scan if you are at high risk for osteoporosis. Discuss your test results, treatment options, and if necessary, the need for more tests with your health care provider. Vaccines  Your health care provider may recommend certain vaccines, such as:  Influenza vaccine. This is recommended every year.  Tetanus, diphtheria, and acellular pertussis (Tdap, Td) vaccine. You may need a Td booster every 10 years.  Zoster vaccine. You may need this after age 36.  Pneumococcal 13-valent conjugate (PCV13) vaccine. You may need this if you have certain conditions and were not previously vaccinated.  Pneumococcal polysaccharide (PPSV23) vaccine. You may need one or two doses if you smoke cigarettes or if you have certain conditions. Talk to your health care provider about which screenings and vaccines you need and how often you need them. This information is not intended to replace advice given to you by your health care provider. Make sure you discuss any questions you have with your health care provider. Document Released: 10/03/2015 Document Revised: 05/26/2016 Document Reviewed: 07/08/2015 Elsevier Interactive Patient Education  2017 [employer] Prevention in the Home Falls can cause injuries. They can happen to people of all ages. There are many things you can do to make your home safe and to help prevent falls. What can I do on the outside of my home?  Regularly fix the edges of walkways and driveways and fix any cracks.  Remove anything that might make you trip as you walk through a door, such as a raised step or threshold.  Trim any bushes or trees on the path to your home.  Use bright outdoor lighting.  Clear any walking paths of anything that might make someone trip, such as rocks or tools.  Regularly check to see if handrails are loose or broken. Make sure that both sides of any steps have handrails.  Any raised  decks and porches should have guardrails on the edges.  Have any leaves, snow, or ice cleared regularly.  Use sand or salt on walking paths during winter.  Clean up any spills in your garage right away. This includes oil or grease spills. What can I do in the bathroom?  Use night lights.  Install grab bars by the toilet and in the tub and shower. Do not use towel bars as grab bars.  Use non-skid mats or decals in the tub or shower.  If you need to sit down in the shower, use a plastic, non-slip stool.  Keep the floor dry. Clean up any water that spills on the floor as soon as it happens.  Remove soap buildup in the tub or shower regularly.  Attach bath mats securely with double-sided non-slip rug tape.  Do not have throw rugs and other things on the floor that can make you trip. What can I do in the bedroom?  Use night lights.  Make sure that you have a light by your bed that is easy to reach.  Do not use any sheets or blankets that are too big for your bed. They should not hang down onto the floor.  Have a firm chair that has side  arms. You can use this for support while you get dressed.  Do not have throw rugs and other things on the floor that can make you trip. What can I do in the kitchen?  Clean up any spills right away.  Avoid walking on wet floors.  Keep items that you use a lot in easy-to-reach places.  If you need to reach something above you, use a strong step stool that has a grab bar.  Keep electrical cords out of the way.  Do not use floor polish or wax that makes floors slippery. If you must use wax, use non-skid floor wax.  Do not have throw rugs and other things on the floor that can make you trip. What can I do with my stairs?  Do not leave any items on the stairs.  Make sure that there are handrails on both sides of the stairs and use them. Fix handrails that are broken or loose. Make sure that handrails are as long as the stairways.  Check  any carpeting to make sure that it is firmly attached to the stairs. Fix any carpet that is loose or worn.  Avoid having throw rugs at the top or bottom of the stairs. If you do have throw rugs, attach them to the floor with carpet tape.  Make sure that you have a light switch at the top of the stairs and the bottom of the stairs. If you do not have them, ask someone to add them for you. What else can I do to help prevent falls?  Wear shoes that:  Do not have high heels.  Have rubber bottoms.  Are comfortable and fit you well.  Are closed at the toe. Do not wear sandals.  If you use a stepladder:  Make sure that it is fully opened. Do not climb a closed stepladder.  Make sure that both sides of the stepladder are locked into place.  Ask someone to hold it for you, if possible.  Clearly mark and make sure that you can see:  Any grab bars or handrails.  First and last steps.  Where the edge of each step is.  Use tools that help you move around (mobility aids) if they are needed. These include:  Canes.  Walkers.  Scooters.  Crutches.  Turn on the lights when you go into a dark area. Replace any light bulbs as soon as they burn out.  Set up your furniture so you have a clear path. Avoid moving your furniture around.  If any of your floors are uneven, fix them.  If there are any pets around you, be aware of where they are.  Review your medicines with your doctor. Some medicines can make you feel dizzy. This can increase your chance of falling. Ask your doctor what other things that you can do to help prevent falls. This information is not intended to replace advice given to you by your health care provider. Make sure you discuss any questions you have with your health care provider. Document Released: 07/03/2009 Document Revised: 02/12/2016 Document Reviewed: 10/11/2014 Elsevier Interactive Patient Education  2017 [employer].

## 2020-10-17 ENCOUNTER — Ambulatory Visit: Payer: PPO | Admitting: *Deleted

## 2020-10-17 ENCOUNTER — Ambulatory Visit: Payer: Self-pay | Admitting: *Deleted

## 2020-10-17 DIAGNOSIS — F4381 Prolonged grief disorder: Secondary | ICD-10-CM

## 2020-10-17 DIAGNOSIS — F419 Anxiety disorder, unspecified: Secondary | ICD-10-CM

## 2020-10-17 DIAGNOSIS — F4329 Adjustment disorder with other symptoms: Secondary | ICD-10-CM

## 2020-10-17 DIAGNOSIS — F329 Major depressive disorder, single episode, unspecified: Secondary | ICD-10-CM

## 2020-10-17 NOTE — Chronic Care Management (AMB) (Signed)
Chronic Care Management    Clinical Social Work Note  10/17/2020 Name: Tara Scott MRN: [id number] DOB: 08/25/1961  Tara Scott is a 60 y.o. year old female who is a primary care patient of Bacigalupo, Dionne Bucy, MD. The CCM team was consulted to assist the patient with chronic disease management and/or care coordination needs related to: Mental Health Counseling and Resources.   Collaboration with local mental health agencies for possible long term follow up in response to provider referral for social work chronic care management and care coordination services.    Assessment: Review of patient past medical history, allergies, medications, and health status, including review of relevant consultants reports was performed today as part of a comprehensive evaluation and provision of chronic care management and care coordination services.       Advanced Directives Status: Not addressed in this encounter.  CCM Care Plan  Allergies  Allergen Reactions  . Anesthetics, Halogenated Other (See Comments)    Other reaction(s): Other (See Comments)  . Ciprofloxacin     swelling  . Methimazole     hives  . Oxycodone-Acetaminophen Nausea And Vomiting  . Penicillins Other (See Comments)    RXN AS A CHILD    Outpatient Encounter Medications as of 10/17/2020  Medication Sig Note  . ALPRAZolam (XANAX) 0.5 MG tablet Take 1 tablet (0.5 mg total) by mouth daily as needed. (Patient not taking: Reported on 10/16/2020)   . busPIRone (BUSPAR) 7.5 MG tablet Take 1 tablet (7.5 mg total) by mouth 2 (two) times daily. (Patient not taking: Reported on 10/16/2020) 10/16/2020: Has not started yet.  . citalopram (CELEXA) 40 MG tablet TAKE 1 TABLET BY MOUTH EVERY DAY FOR DEPRESSION   . gabapentin (NEURONTIN) 300 MG capsule Take 1 capsule (300 mg total) by mouth at bedtime. (Patient not taking: No sig reported)   . levothyroxine (SYNTHROID) 100 MCG tablet TAKE 1 TABLET BY MOUTH DAILY BEFORE BREAKFAST.   [name]  traZODone (DESYREL) 50 MG tablet TAKE 1/2 TO 1 TABLET BY MOUTH AT BEDTIME AS NEEDED FOR SLEEP (Patient not taking: No sig reported)    No facility-administered encounter medications on file as of 10/17/2020.    Patient Active Problem List   Diagnosis Date Noted  . Postablative hypothyroidism 09/04/2019  . Tobacco use disorder 09/04/2019  . Benign essential tremor 12/26/2015  . Anxiety 03/14/2015  . Central core disease ([place]) 03/14/2015  . Dizziness 03/14/2015  . MD (muscular dystrophy) ([place]) 03/14/2015  . Avitaminosis D 04/16/2010  . Goiter, nontoxic, multinodular 11/04/2009  . Absence of menstruation 08/18/2009  . Acid reflux 12/16/2008  . Cannot sleep 11/17/2008  . Adnexal pain 10/04/2007  . MDD (major depressive disorder) 09/01/2007    Conditions to be addressed/monitored: Anxiety and Depression; Mental Health Concerns   Care Plan : Depression (Adult)  Updates made by [name], LCSW since 10/17/2020 12:00 AM    Problem: Symptoms (Depression)     Long-Range Goal: Symptoms Monitored and Managed   Start Date: 10/15/2020  Expected End Date: 03/16/2021  Recent Progress: On track  Priority: High  Note:   Current Barriers:  [name] Mental Health Concerns  . Unable to independently cope with recent loss of sister and spouse  Clinical Social Work Clinical Goal(s):  [name] Over the next 90 days, patient will follow up with with a mental health therapist for ongoing mental health counseling* as directed by SW  Interventions: . 1:1 collaboration with [name], MD regarding development and update of comprehensive  plan of care as evidenced by provider attestation and co-signature . Inter-disciplinary care team collaboration (see longitudinal plan of care) . Follow up phone calls made to multiple mental health agencies that specialize in prolonged grief and depression for ongoing follow up for patient. [facility] , [facility], [facility], [facility] [place] . CSW to discuss follow up with patient during scheduled call next week    Patient Goals/Self-Care Activities Over the next 90 days, patient will:   - Patient will self administer medications as prescribed Patient will attend all scheduled provider appointments Patient will continue to perform ADL's independently Patient will continue to perform IADL's independently Patient will call provider office for new concerns or questions  Follow up Plan: SW will follow up with patient by phone over the next 5-7 business days         Task: Alleviate Barriers to Depression Treatment   Due Date: 03/17/2021  Priority: Routine  Note:     :       Follow Up Plan: SW will follow up with patient by phone over the next 7-14 business days       Elliot Gurney, [place] Worker  [facility] [phone]

## 2020-10-17 NOTE — Chronic Care Management (AMB) (Signed)
Chronic Care Management    Clinical Social Work Note  10/17/2020 Name: LADAWN BOULLION MRN: [id number] DOB: 05-11-1961  CANDANCE BOHLMAN is a 60 y.o. year old female who is a primary care patient of Bacigalupo, Dionne Bucy, MD. The CCM team was consulted to assist the patient with chronic disease management and/or care coordination needs related to: Mental Health Counseling and Resources.   Collaboration with [facility] for referral for ongoing mental health couseling in response to provider referral for social work chronic care management and care coordination services.   Consent to Services:  The patient was given information about Chronic Care Management services, agreed to services, and gave verbal consent prior to initiation of services.  Please see initial visit note for detailed documentation.   Patient agreed to services and consent obtained.   Assessment: Review of patient past medical history, allergies, medications, and health status, including review of relevant consultants reports was performed today as part of a comprehensive evaluation and provision of chronic care management and care coordination services.     SDOH (Social Determinants of Health) assessments and interventions performed:    Advanced Directives Status: Not addressed in this encounter.  CCM Care Plan  Allergies  Allergen Reactions  . Anesthetics, Halogenated Other (See Comments)    Other reaction(s): Other (See Comments)  . Ciprofloxacin     swelling  . Methimazole     hives  . Oxycodone-Acetaminophen Nausea And Vomiting  . Penicillins Other (See Comments)    RXN AS A CHILD    Outpatient Encounter Medications as of 10/17/2020  Medication Sig Note  . ALPRAZolam (XANAX) 0.5 MG tablet Take 1 tablet (0.5 mg total) by mouth daily as needed. (Patient not taking: Reported on 10/16/2020)   . busPIRone (BUSPAR) 7.5 MG tablet Take 1 tablet (7.5 mg total) by mouth 2 (two) times daily. (Patient not taking:  Reported on 10/16/2020) 10/16/2020: Has not started yet.  . citalopram (CELEXA) 40 MG tablet TAKE 1 TABLET BY MOUTH EVERY DAY FOR DEPRESSION   . gabapentin (NEURONTIN) 300 MG capsule Take 1 capsule (300 mg total) by mouth at bedtime. (Patient not taking: No sig reported)   . levothyroxine (SYNTHROID) 100 MCG tablet TAKE 1 TABLET BY MOUTH DAILY BEFORE BREAKFAST.   [name] traZODone (DESYREL) 50 MG tablet TAKE 1/2 TO 1 TABLET BY MOUTH AT BEDTIME AS NEEDED FOR SLEEP (Patient not taking: No sig reported)    No facility-administered encounter medications on file as of 10/17/2020.    Patient Active Problem List   Diagnosis Date Noted  . Postablative hypothyroidism 09/04/2019  . Tobacco use disorder 09/04/2019  . Benign essential tremor 12/26/2015  . Anxiety 03/14/2015  . 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 Dizziness 03/14/2015  . MD (muscular dystrophy) ([place]) 03/14/2015  . Avitaminosis D 04/16/2010  . Goiter, nontoxic, multinodular 11/04/2009  . Absence of menstruation 08/18/2009  . Acid reflux 12/16/2008  . Cannot sleep 11/17/2008  . Adnexal pain 10/04/2007  . MDD (major depressive disorder) 09/01/2007    Conditions to be addressed/monitored: Depression; Mental Health Concerns   Care Plan : Depression (Adult)  Updates made by [name], LCSW since 10/17/2020 12:00 AM    Problem: Symptoms (Depression)     Long-Range Goal: Symptoms Monitored and Managed   Start Date: 10/15/2020  Expected End Date: 03/16/2021  Recent Progress: On track  Priority: High  Note:   Current Barriers:  [name] Mental Health Concerns  . Unable to independently cope with recent loss  of sister and spouse  Clinical Social Work Clinical Goal(s):  [name] Over the next 90 days, patient will follow up with with a mental health therapist for ongoing mental health counseling* as directed by SW  Interventions: . 1:1 collaboration with Brita Romp, Dionne Bucy, MD regarding development and update of comprehensive plan of care  as evidenced by provider attestation and co-signature . Inter-disciplinary care team collaboration (see longitudinal plan of care) . Follow up phone call from [facility] [place] who confirms that she is contracted with patient's insurance and is willing to follow patient for ongoing treatment once referral is received . Referral to be faxed to [phone] . CSW to confirm  referral with patient during follow up phone call next week    Patient Goals/Self-Care Activities Over the next 90 days, patient will:   - Patient will self administer medications as prescribed Patient will attend all scheduled provider appointments Patient will continue to perform ADL's independently Patient will continue to perform IADL's independently Patient will call provider office for new concerns or questions  Follow up Plan: SW will follow up with patient by phone over the next 5-7 business days         Task: Alleviate Barriers to Depression Treatment   Due Date: 03/17/2021  Priority: Routine  Note:     :       Follow Up Plan: SW will follow up with patient by phone over the next 5-7 buisness days     Elliot Gurney, [place] Worker  [facility] [phone]

## 2020-10-20 ENCOUNTER — Telehealth: Payer: Self-pay | Admitting: *Deleted

## 2020-10-20 NOTE — Telephone Encounter (Signed)
   Chronic Care Management   Unsuccessful Call Note 10/20/2020 Name: Tara Scott MRN: [id number] DOB: 1961/01/05  Tara Scott is a 60 year old female who sees Carles Collet, [place] for primary care. Carles Collet, PA-C asked the CCM team to consult the patient for Mental Health Counseling and Resources.     This social worker was unable to reach patient via telephone today for follow up call. I have left HIPAA compliant voicemail asking patient to return my call. (unsuccessful outreach #[phone]).   Plan: Will follow-up within 7 business days via telephone.      Elliot Gurney, [place] Worker  [facility] [phone]

## 2020-10-22 ENCOUNTER — Telehealth: Payer: Self-pay

## 2020-10-28 ENCOUNTER — Ambulatory Visit: Payer: Self-pay | Admitting: *Deleted

## 2020-10-28 DIAGNOSIS — F419 Anxiety disorder, unspecified: Secondary | ICD-10-CM

## 2020-10-28 DIAGNOSIS — F329 Major depressive disorder, single episode, unspecified: Secondary | ICD-10-CM

## 2020-10-28 DIAGNOSIS — F4329 Adjustment disorder with other symptoms: Secondary | ICD-10-CM

## 2020-10-28 DIAGNOSIS — F4381 Prolonged grief disorder: Secondary | ICD-10-CM

## 2020-10-28 NOTE — Patient Instructions (Signed)
Visit Information  PATIENT GOALS:  Goals Addressed            This Visit's Progress   . Begin and Stick with Counseling-Depression       Timeframe:  Long-Range Goal Priority:  High Start Date:   10/15/20                          Expected End Date:  04/14/21                     Follow Up Date 10/22/2020    - check out counseling-referral to be made to [facility]-appt to be scheduled once referral is received - keep 90 percent of counseling appointment   Why is this important?    Beating depression may take some time.   If you don't feel better right away, don't give up on your treatment plan.    Notes:        The patient verbalized understanding of instructions, educational materials, and care plan provided today and declined offer to receive copy of patient instructions, educational materials, and care plan.   Telephone follow up appointment with care management team member scheduled for: 11/11/20   Elliot Gurney, [place] Worker  [facility] [phone]

## 2020-10-28 NOTE — Chronic Care Management (AMB) (Signed)
Chronic Care Management    Clinical Social Work Note  10/28/2020 Name: Tara Scott MRN: [id number] DOB: [birth year]  Tara Scott is a 60 y.o. year old female who is a primary care patient of Bacigalupo, Dionne Bucy, MD. The CCM team was consulted to assist the patient with chronic disease management and/or care coordination needs related to: Mental Health Counseling and Resources.   Engaged with patient by telephone for follow up visit in response to provider referral for social work chronic care management and care coordination services.   Consent to Services:  The patient was given information about Chronic Care Management services, agreed to services, and gave verbal consent prior to initiation of services.  Please see initial visit note for detailed documentation.   Patient agreed to services and consent obtained.   Assessment: Review of patient past medical history, allergies, medications, and health status, including review of relevant consultants reports was performed today as part of a comprehensive evaluation and provision of chronic care management and care coordination services.     SDOH (Social Determinants of Health) assessments and interventions performed:  SDOH Interventions   Flowsheet Row Most Recent Value  SDOH Interventions   Depression Interventions/Treatment  Counseling       Advanced Directives Status: Not addressed in this encounter.  CCM Care Plan  Allergies  Allergen Reactions  . Anesthetics, Halogenated Other (See Comments)    Other reaction(s): Other (See Comments)  . Ciprofloxacin     swelling  . Methimazole     hives  . Oxycodone-Acetaminophen Nausea And Vomiting  . Penicillins Other (See Comments)    RXN AS A CHILD    Outpatient Encounter Medications as of 10/28/2020  Medication Sig Note  . ALPRAZolam (XANAX) 0.5 MG tablet Take 1 tablet (0.5 mg total) by mouth daily as needed. (Patient not taking: Reported on 10/16/2020)   . busPIRone (BUSPAR)  7.5 MG tablet Take 1 tablet (7.5 mg total) by mouth 2 (two) times daily. (Patient not taking: Reported on 10/16/2020) 10/16/2020: Has not started yet.  . citalopram (CELEXA) 40 MG tablet TAKE 1 TABLET BY MOUTH EVERY DAY FOR DEPRESSION   . gabapentin (NEURONTIN) 300 MG capsule Take 1 capsule (300 mg total) by mouth at bedtime. (Patient not taking: No sig reported)   . levothyroxine (SYNTHROID) 100 MCG tablet TAKE 1 TABLET BY MOUTH DAILY BEFORE BREAKFAST.   [name] traZODone (DESYREL) 50 MG tablet TAKE 1/2 TO 1 TABLET BY MOUTH AT BEDTIME AS NEEDED FOR SLEEP (Patient not taking: No sig reported)    No facility-administered encounter medications on file as of 10/28/2020.    Patient Active Problem List   Diagnosis Date Noted  . Postablative hypothyroidism 09/04/2019  . Tobacco use disorder 09/04/2019  . Benign essential tremor 12/26/2015  . Anxiety 03/14/2015  . Central core disease ([place]) 03/14/2015  . Dizziness 03/14/2015  . MD (muscular dystrophy) ([place]) 03/14/2015  . Avitaminosis D 04/16/2010  . Goiter, nontoxic, multinodular 11/04/2009  . Absence of menstruation 08/18/2009  . Acid reflux 12/16/2008  . Cannot sleep 11/17/2008  . Adnexal pain 10/04/2007  . MDD (major depressive disorder) 09/01/2007    Conditions to be addressed/monitored: Anxiety and Depression; Mental Health Concerns   Care Plan : Depression (Adult)  Updates made by Tara Claude, LCSW since 10/28/2020 12:00 AM    Problem: Symptoms (Depression)     Long-Range Goal: Symptoms Monitored and Managed   Start Date: 10/15/2020  Expected End Date: 03/16/2021  Recent Progress: On track  Priority: High  Note:   Current Barriers:  [name] Mental Health Concerns  . Unable to independently cope with recent loss of sister and spouse  Clinical Social Work Clinical Goal(s):  [name] Over the next 90 days, patient will follow up with with a mental health therapist for ongoing mental health counseling* as directed by SW  Interventions: . 1:1  collaboration with Brita Romp, Dionne Bucy, MD regarding development and update of comprehensive plan of care as evidenced by provider attestation and co-signature . Inter-disciplinary care team collaboration (see longitudinal plan of care) . Continued to provide emotional support to patient related to the loss of her twin sister . Allowed patient to vent her frustrations in regards to her family dynamic and explored possible ways to cope . Emphasized self care and recommendation for ongoing mental health follow up . Discussed follow up phone call from [facility] who confirms that she is contracted with patient's insurance and is willing to follow patient for ongoing treatment once referral is received . Referral to be faxed to [phone] . Patient agrees with referral and will be expecting a call from [facility] to schedule    Patient Goals/Self-Care Activities Over the next 90 days, patient will:   - Patient will self administer medications as prescribed Patient will attend all scheduled provider appointments Patient will continue to perform ADL's independently Patient will continue to perform IADL's independently Patient will call provider office for new concerns or questions  Follow up Plan: SW will follow up with patient by phone over the next 5-7 business days            Follow Up Plan: SW will follow up with patient by phone over the next 14 business days       [place], [place] Worker  [facility] [phone]

## 2020-10-31 ENCOUNTER — Telehealth: Payer: Self-pay | Admitting: Family Medicine

## 2020-10-31 DIAGNOSIS — Z1231 Encounter for screening mammogram for malignant neoplasm of breast: Secondary | ICD-10-CM

## 2020-10-31 NOTE — Telephone Encounter (Signed)
Pt was advise that her Breast exam order was sent to [facility] on 1.27.22/ Pt called and stated that [facility] doesn't have the order/ they only show an order that has expired from 2021/ please advise

## 2020-11-03 ENCOUNTER — Other Ambulatory Visit: Payer: Self-pay

## 2020-11-03 ENCOUNTER — Ambulatory Visit (INDEPENDENT_AMBULATORY_CARE_PROVIDER_SITE_OTHER): Payer: PPO | Admitting: Family Medicine

## 2020-11-03 VITALS — BP 128/58 | HR 62 | Temp 98.3°F | Ht 62.0 in | Wt 137.8 lb

## 2020-11-03 DIAGNOSIS — K219 Gastro-esophageal reflux disease without esophagitis: Secondary | ICD-10-CM

## 2020-11-03 DIAGNOSIS — Z1231 Encounter for screening mammogram for malignant neoplasm of breast: Secondary | ICD-10-CM

## 2020-11-03 DIAGNOSIS — R1319 Other dysphagia: Secondary | ICD-10-CM | POA: Insufficient documentation

## 2020-11-03 DIAGNOSIS — K5900 Constipation, unspecified: Secondary | ICD-10-CM | POA: Diagnosis not present

## 2020-11-03 DIAGNOSIS — F419 Anxiety disorder, unspecified: Secondary | ICD-10-CM | POA: Diagnosis not present

## 2020-11-03 DIAGNOSIS — Z1211 Encounter for screening for malignant neoplasm of colon: Secondary | ICD-10-CM

## 2020-11-03 MED ORDER — OMEPRAZOLE 20 MG PO CPDR: 20.0000 mg | DELAYED_RELEASE_CAPSULE | Freq: Every day | ORAL | 3 refills | Status: DC

## 2020-11-03 NOTE — Patient Instructions (Addendum)
[facility]  High-Fiber Eating Plan Fiber, also called dietary fiber, is a type of carbohydrate. It is found foods such as fruits, vegetables, whole grains, and beans. A high-fiber diet can have many health benefits. Your health care provider may recommend a high-fiber diet to help:  Prevent constipation. Fiber can make your bowel movements more regular.  Lower your cholesterol.  Relieve the following conditions: ? Inflammation of veins in the anus (hemorrhoids). ? Inflammation of specific areas of the digestive tract (uncomplicated diverticulosis). ? A problem of the large intestine, also called the colon, that sometimes causes pain and diarrhea (irritable bowel syndrome, or IBS).  Prevent overeating as part of a weight-loss plan.  Prevent heart disease, type 2 diabetes, and certain cancers. What are tips for following this plan? Reading food labels  Check the nutrition facts label on food products for the amount of dietary fiber. Choose foods that have 5 grams of fiber or more per serving.  The goals for recommended daily fiber intake include: ? Men (age 105 or younger): 34-38 g. ? Men (over age 6): 28-34 g. ? Women (age 24 or younger): 25-28 g. ? Women (over age 45): 22-25 g. Your daily fiber goal is _____________ g.   Shopping  Choose whole fruits and vegetables instead of processed forms, such as apple juice or applesauce.  Choose a wide variety of high-fiber foods such as avocados, lentils, oats, and kidney beans.  Read the nutrition facts label of the foods you choose. Be aware of foods with added fiber. These foods often have high sugar and sodium amounts per serving. Cooking  Use whole-grain flour for baking and cooking.  Cook with brown rice instead of white rice. Meal planning  Start the day with a breakfast that is high in fiber, such as a cereal that contains 5 g of fiber or more per serving.  Eat breads and cereals that are made  with whole-grain flour instead of refined flour or white flour.  Eat brown rice, bulgur wheat, or millet instead of white rice.  Use beans in place of meat in soups, salads, and pasta dishes.  Be sure that half of the grains you eat each day are whole grains. General information  You can get the recommended daily intake of dietary fiber by: ? Eating a variety of fruits, vegetables, grains, nuts, and beans. ? Taking a fiber supplement if you are not able to take in enough fiber in your diet. It is better to get fiber through food than from a supplement.  Gradually increase how much fiber you consume. If you increase your intake of dietary fiber too quickly, you may have bloating, cramping, or gas.  Drink plenty of water to help you digest fiber.  Choose high-fiber snacks, such as berries, raw vegetables, nuts, and popcorn. What foods should I eat? Fruits Berries. Pears. Apples. Oranges. Avocado. Prunes and raisins. Dried figs. Vegetables Sweet potatoes. Spinach. Kale. Artichokes. Cabbage. Broccoli. Cauliflower. Green peas. Carrots. Squash. Grains Whole-grain breads. Multigrain cereal. Oats and oatmeal. Brown rice. Barley. Bulgur wheat. [place]. Quinoa. Bran muffins. Popcorn. Rye wafer crackers. Meats and other proteins Navy beans, kidney beans, and pinto beans. Soybeans. Split peas. Lentils. Nuts and seeds. Dairy Fiber-fortified yogurt. Beverages Fiber-fortified soy milk. Fiber-fortified orange juice. Other foods Fiber bars. The items listed above may not be a complete list of recommended foods and beverages. Contact a dietitian for more information. What foods should I avoid? Fruits Fruit juice. Cooked, strained fruit. Vegetables Fried potatoes.  Canned vegetables. Well-cooked vegetables. Grains White bread. Pasta made with refined flour. White rice. Meats and other proteins Fatty cuts of meat. Fried chicken or fried fish. Dairy Milk. Yogurt. Cream cheese. Sour cream. Fats  and oils Butters. Beverages Soft drinks. Other foods Cakes and pastries. The items listed above may not be a complete list of foods and beverages to avoid. Talk with your dietitian about what choices are best for you. Summary  Fiber is a type of carbohydrate. It is found in foods such as fruits, vegetables, whole grains, and beans.  A high-fiber diet has many benefits. It can help to prevent constipation, lower blood cholesterol, aid weight loss, and reduce your risk of heart disease, diabetes, and certain cancers.  Increase your intake of fiber gradually. Increasing fiber too quickly may cause cramping, bloating, and gas. Drink plenty of water while you increase the amount of fiber you consume.  The best sources of fiber include whole fruits and vegetables, whole grains, nuts, seeds, and beans. This information is not intended to replace advice given to you by your health care provider. Make sure you discuss any questions you have with your health care provider. Document Revised: 01/10/2020 Document Reviewed: 01/10/2020 Elsevier Patient Education  2021 [employer].

## 2020-11-03 NOTE — Progress Notes (Signed)
Established patient visit   Patient: Tara Scott   DOB: [birth year]   60 y.o. Female  MRN: [id number] Visit Date: 11/03/2020  Today's healthcare provider: Lavon Paganini, MD   Chief Complaint  Patient presents with  . Gastroesophageal Reflux    Began about 2 weeks ago.   Subjective    HPI HPI    Gastroesophageal Reflux     Additional comments: Began about 2 weeks ago.       Last edited by Sylvester Harder, [place] on 11/03/2020  3:08 PM. (History)      Anxiety: Patient complains of anxiety disorder.  She has the following symptoms: difficulty concentrating, fatigue, insomnia, racing thoughts. Onset of symptoms was approximately a few years ago, gradually worsening since that time. She denies current suicidal and homicidal ideation. Family history significant for n/a.Possible organic causes contributing are: loss of her twin sister. Risk factors: n/a Previous treatment includes BuSpar and Xanax and none.  She complains of the following side effects from the treatment: none.  Pt has not been taking anxiolytics and would like to discuss there side effects today.  Constipation and reflux x2 weeks. +dysphagia and regurgitation.   Social History   Tobacco Use  . Smoking status: Current Every Day Smoker    Packs/day: 0.50    Years: 40.00    Pack years: 20.00    Types: Cigarettes  . Smokeless tobacco: Never Used  Vaping Use  . Vaping Use: Never used  Substance Use Topics  . Alcohol use: No    Alcohol/week: 0.0 standard drinks  . Drug use: No       Medications: Outpatient Medications Prior to Visit  Medication Sig  . cholecalciferol (VITAMIN D3) 25 MCG (1000 UNIT) tablet Take 1,000 Units by mouth daily.  . citalopram (CELEXA) 40 MG tablet TAKE 1 TABLET BY MOUTH EVERY DAY FOR DEPRESSION  . levothyroxine (SYNTHROID) 100 MCG tablet TAKE 1 TABLET BY MOUTH DAILY BEFORE BREAKFAST.  . busPIRone (BUSPAR) 7.5 MG tablet Take 1 tablet (7.5 mg total) by mouth 2 (two) times  daily. (Patient not taking: No sig reported)  . [DISCONTINUED] ALPRAZolam (XANAX) 0.5 MG tablet Take 1 tablet (0.5 mg total) by mouth daily as needed. (Patient not taking: No sig reported)  . [DISCONTINUED] gabapentin (NEURONTIN) 300 MG capsule Take 1 capsule (300 mg total) by mouth at bedtime. (Patient not taking: No sig reported)  . [DISCONTINUED] traZODone (DESYREL) 50 MG tablet TAKE 1/2 TO 1 TABLET BY MOUTH AT BEDTIME AS NEEDED FOR SLEEP (Patient not taking: No sig reported)   No facility-administered medications prior to visit.    Review of Systems  Constitutional: Negative.   Respiratory: Negative.   Cardiovascular: Negative.   Gastrointestinal: Positive for abdominal pain, constipation and nausea.  Musculoskeletal: Positive for back pain.       Objective    BP (!) 128/58 (BP Location: Left Arm, Patient Position: Sitting, Cuff Size: Large)   Pulse 62   Temp 98.3 F (36.8 C) (Oral)   Ht 5\' 2"  (1.575 m)   Wt 137 lb 12.8 oz (62.5 kg)   SpO2 100%   BMI 25.20 kg/m     Physical Exam Constitutional:      General: She is not in acute distress.    Appearance: Normal appearance. She is not diaphoretic.  HENT:     Head: Normocephalic.  Eyes:     Conjunctiva/sclera: Conjunctivae normal.  Cardiovascular:     Rate and Rhythm: Normal rate and regular  rhythm.     Pulses: Normal pulses.     Heart sounds: No murmur heard.   Pulmonary:     Effort: Pulmonary effort is normal. No respiratory distress.     Breath sounds: Normal breath sounds. No wheezing.  Abdominal:     General: There is no distension.     Palpations: Abdomen is soft.     Tenderness: There is no abdominal tenderness.  Musculoskeletal:     Right lower leg: No edema.     Left lower leg: No edema.  Neurological:     Mental Status: She is alert and oriented to person, place, and time. Mental status is at baseline.  Psychiatric:        Mood and Affect: Affect normal. Mood is anxious.        Behavior: Behavior  normal.       No results found for any visits on 11/03/20.  Assessment & Plan     Problem List Items Addressed This Visit      Digestive   Acid reflux    New problem with esophageal dysphagia  Will start PPI and refer to GI for possible EGD      Relevant Medications   omeprazole (PRILOSEC) 20 MG capsule   Other Relevant Orders   Ambulatory referral to Gastroenterology   Esophageal dysphagia    New problem with GERD symptoms and esophageal dysphagia  Will start PPI and refer to GI for possible EGD Benign abd exam No trouble swallowing liquids      Relevant Orders   Ambulatory referral to Gastroenterology     Other   Anxiety - Primary    Longstanding/chronic Uncontrolled Exacerbated by recent loss of her sister Continue Celexa 40 mg daily Was previously using Xanax very infrequently with only 30 to 60 pills/year of a low dose, but finding that she is more anxious lately Encouraged her to start the BuSpar that was previously prescribed Encourage therapy Contracted for safety-no SI/HI      Constipation    Encouraged her to increase miralax and titrate to one soft BM Also due for colonoscopy soon      Relevant Orders   Ambulatory referral to Gastroenterology    Other Visit Diagnoses    Breast cancer screening by mammogram       Relevant Orders   MM 3D Screening Breast Bilateral - [place]   Screening for colon cancer       Relevant Orders   Ambulatory referral to Gastroenterology       Return in about 3 months (around 01/31/2021) for chronic disease f/u.      I, Lavon Paganini, MD, have reviewed all documentation for this visit. The documentation on 11/04/20 for the exam, diagnosis, procedures, and orders are all accurate and complete.   Hue Frick, Dionne Bucy, MD, MPH [facility]

## 2020-11-04 NOTE — Assessment & Plan Note (Signed)
Longstanding/chronic Uncontrolled Exacerbated by recent loss of her sister Continue Celexa 40 mg daily Was previously using Xanax very infrequently with only 30 to 60 pills/year of a low dose, but finding that she is more anxious lately Encouraged her to start the BuSpar that was previously prescribed Encourage therapy Contracted for safety-no SI/HI

## 2020-11-04 NOTE — Assessment & Plan Note (Signed)
New problem with esophageal dysphagia  Will start PPI and refer to GI for possible EGD

## 2020-11-04 NOTE — Assessment & Plan Note (Signed)
Encouraged her to increase miralax and titrate to one soft BM Also due for colonoscopy soon

## 2020-11-04 NOTE — Assessment & Plan Note (Signed)
New problem with GERD symptoms and esophageal dysphagia  Will start PPI and refer to GI for possible EGD Benign abd exam No trouble swallowing liquids

## 2020-11-11 ENCOUNTER — Ambulatory Visit: Payer: Self-pay | Admitting: *Deleted

## 2020-11-11 ENCOUNTER — Telehealth: Payer: Self-pay | Admitting: *Deleted

## 2020-11-11 DIAGNOSIS — F329 Major depressive disorder, single episode, unspecified: Secondary | ICD-10-CM

## 2020-11-11 DIAGNOSIS — F4329 Adjustment disorder with other symptoms: Secondary | ICD-10-CM

## 2020-11-11 DIAGNOSIS — F4381 Prolonged grief disorder: Secondary | ICD-10-CM

## 2020-11-11 DIAGNOSIS — F419 Anxiety disorder, unspecified: Secondary | ICD-10-CM

## 2020-11-11 NOTE — Telephone Encounter (Signed)
   Care Management   Unsuccessful Call Note 11/11/2020 Name: Tara Scott MRN: [id number] DOB: 1960/11/30  [name] is a 60 year old female who sees Dr. Brita Romp for primary care. Dr. Brita Romp asked the CCM team to consult the patient for Mental Health Counseling and Resources.   This social worker was unable to reach patient via telephone today for follow up call. I have left HIPAA compliant voicemail asking patient to return my call. (unsuccessful outreach #1).   Plan: Will follow-up within 7 business days via telephone.     Elliot Gurney, [place] Worker  [facility] [phone]

## 2020-11-12 ENCOUNTER — Ambulatory Visit: Payer: Self-pay | Admitting: *Deleted

## 2020-11-12 ENCOUNTER — Telehealth: Payer: Self-pay | Admitting: *Deleted

## 2020-11-12 DIAGNOSIS — F4381 Prolonged grief disorder: Secondary | ICD-10-CM

## 2020-11-12 DIAGNOSIS — F419 Anxiety disorder, unspecified: Secondary | ICD-10-CM

## 2020-11-12 DIAGNOSIS — F4329 Adjustment disorder with other symptoms: Secondary | ICD-10-CM

## 2020-11-12 DIAGNOSIS — F329 Major depressive disorder, single episode, unspecified: Secondary | ICD-10-CM

## 2020-11-12 NOTE — Progress Notes (Signed)
Established patient visit   Patient: Tara Scott   DOB: 1960/11/14   60 y.o. Female  MRN: [id number] Visit Date: 11/13/2020  Today's healthcare provider: Trinna Post, PA-C   Chief Complaint  Patient presents with  . Back Pain  I,Porsha C McClurkin,acting as a scribe for Trinna Post, PA-C.,have documented all relevant documentation on the behalf of Trinna Post, PA-C,as directed by  Trinna Post, PA-C while in the presence of Trinna Post, PA-C.  Subjective    Back Pain This is a recurrent problem. The current episode started 1 to 4 weeks ago. The problem occurs constantly. The problem has been gradually worsening since onset. The pain is present in the lumbar spine. The quality of the pain is described as burning. The pain does not radiate. The pain is at a severity of 8/10. The pain is severe. The pain is the same all the time. The symptoms are aggravated by sitting, standing, lying down, position, bending and twisting. Stiffness is present in the morning. Pertinent negatives include no abdominal pain, chest pain, leg pain, numbness, pelvic pain or tingling. She has tried bed rest and ice for the symptoms. The treatment provided mild relief.    She reports that two weeks ago she had lifted something and reports significant left sided back pain after this radiates straight across her back. She mentioned it to her PCP during her most recent visit here on 11/03/2020, who thought it was a pulled muscle. It does not travel down her back or legs. Does not cause numbness or tingling. She has been seen by neurosurgery in 07/2020 for chronic back pain and MRI lumbar spine showed:  IMPRESSION: 1. Shallow left foraminal to extraforaminal disc protrusion at L2-3, contacting and potentially irritating the exiting left L2 nerve root. 2. Mild to moderate facet hypertrophy at L3-4 through L5-S1. 3. No other significant disc pathology within the lumbar spine. No significant  stenosis or evidence for neural impingement. No cord compression.  She reports this back pain feels different from the above back pain for which she was seen by neurosurgery.   Reports she is having issues with heartburn. Was started on omperazole last week by her PCP but was nervous to take this due to interaction between citlopram and heart issues. She has taken nexium previously without any issues.   She is also nervous about taking buspar with citalopram.      Medications: Outpatient Medications Prior to Visit  Medication Sig  . cholecalciferol (VITAMIN D3) 25 MCG (1000 UNIT) tablet Take 1,000 Units by mouth daily.  . citalopram (CELEXA) 40 MG tablet TAKE 1 TABLET BY MOUTH EVERY DAY FOR DEPRESSION  . levothyroxine (SYNTHROID) 100 MCG tablet TAKE 1 TABLET BY MOUTH DAILY BEFORE BREAKFAST.  [name] omeprazole (PRILOSEC) 20 MG capsule Take 1 capsule (20 mg total) by mouth daily.  . busPIRone (BUSPAR) 7.5 MG tablet Take 1 tablet (7.5 mg total) by mouth 2 (two) times daily. (Patient not taking: No sig reported)   No facility-administered medications prior to visit.    Review of Systems  Cardiovascular: Negative for chest pain.  Gastrointestinal: Negative for abdominal pain.  Genitourinary: Negative for pelvic pain.  Musculoskeletal: Positive for back pain.  Neurological: Negative for tingling and numbness.       Objective    BP 130/74 (BP Location: Left Arm, Patient Position: Sitting, Cuff Size: Normal)   Pulse 62   Temp 98 F (36.7 C) (Oral)  Wt 138 lb 8 oz (62.8 kg)   SpO2 100%   BMI 25.33 kg/m     Physical Exam Constitutional:      Appearance: Normal appearance. She is normal weight.  Skin:    General: Skin is warm and dry.  Neurological:     General: No focal deficit present.     Mental Status: She is alert and oriented to person, place, and time.  Psychiatric:        Mood and Affect: Mood normal.        Behavior: Behavior normal.       No results found for  any visits on 11/13/20.  Assessment & Plan    1. Chronic low back pain without sciatica, unspecified back pain laterality  Do suspect this is a muscle strain. Explained this is generally self limiting over the course of several weeks. She does have some chronic back pain and DDD but patient identifies this pain as distinct. If not improving, may consider further imaging and/or radiation of pain from arthritic changes. Patient does not want to use any medications for this issue if she does not have to.   2. Acute low back pain without sciatica, unspecified back pain laterality   3. Heartburn  Counseled that she can take the omperazole prescribed by her PCP. There is a potential interaction. We can get an EKG at her next visit if it would make her comfortable. I also offered to change her omeprazole to pepcid which does not have this interaction. She feels since she has taken nexium previously without an issue the omperazole will be OK.    No follow-ups on file.      ITrinna Post, PA-C, have reviewed all documentation for this visit. The documentation on 11/14/20 for the exam, diagnosis, procedures, and orders are all accurate and complete.    Paulene Floor  [facility] [phone] (phone) [phone] (fax)  [place]

## 2020-11-12 NOTE — Chronic Care Management (AMB) (Signed)
Chronic Care Management    Clinical Social Work Note  11/12/2020 Name: Tara Scott MRN: [id number] DOB: September 03, 1961  Tara Scott is a 60 y.o. year old female who is a primary care patient of Bacigalupo, Dionne Bucy, MD. The CCM team was consulted to assist the patient with chronic disease management and/or care coordination needs related to: Mental Health Counseling and Resources.   Collaboration with [facility] for follow up regarding mental health intake appointment in response to provider referral for social work chronic care management and care coordination services.   Consent to Services:  The patient was given information about Chronic Care Management services, agreed to services, and gave verbal consent prior to initiation of services.  Please see initial visit note for detailed documentation.   Patient agreed to services and consent obtained.   Assessment: Review of patient past medical history, allergies, medications, and health status, including review of relevant consultants reports was performed today as part of a comprehensive evaluation and provision of chronic care management and care coordination services.     SDOH (Social Determinants of Health) assessments and interventions performed:    Advanced Directives Status: Not addressed in this encounter.  CCM Care Plan  Allergies  Allergen Reactions  . Anesthetics, Halogenated Other (See Comments)    Other reaction(s): Other (See Comments)  . Ciprofloxacin     swelling  . Methimazole     hives  . Oxycodone-Acetaminophen Nausea And Vomiting  . Penicillins Other (See Comments)    RXN AS A CHILD    Outpatient Encounter Medications as of 11/11/2020  Medication Sig Note  . busPIRone (BUSPAR) 7.5 MG tablet Take 1 tablet (7.5 mg total) by mouth 2 (two) times daily. (Patient not taking: No sig reported) 10/16/2020: Has not started yet.  . cholecalciferol (VITAMIN D3) 25 MCG (1000 UNIT) tablet Take 1,000 Units by  mouth daily.   . citalopram (CELEXA) 40 MG tablet TAKE 1 TABLET BY MOUTH EVERY DAY FOR DEPRESSION   . levothyroxine (SYNTHROID) 100 MCG tablet TAKE 1 TABLET BY MOUTH DAILY BEFORE BREAKFAST.   [name] omeprazole (PRILOSEC) 20 MG capsule Take 1 capsule (20 mg total) by mouth daily.    No facility-administered encounter medications on file as of 11/11/2020.    Patient Active Problem List   Diagnosis Date Noted  . Esophageal dysphagia 11/03/2020  . Postablative hypothyroidism 09/04/2019  . Tobacco use disorder 09/04/2019  . Benign essential tremor 12/26/2015  . Anxiety 03/14/2015  . Central core disease ([place]) 03/14/2015  . Dizziness 03/14/2015  . MD (muscular dystrophy) ([place]) 03/14/2015  . Avitaminosis D 04/16/2010  . Constipation 02/17/2010  . Goiter, nontoxic, multinodular 11/04/2009  . Absence of menstruation 08/18/2009  . Acid reflux 12/16/2008  . Cannot sleep 11/17/2008  . Adnexal pain 10/04/2007  . MDD (major depressive disorder) 09/01/2007    Conditions to be addressed/monitored:  Mental Health Concerns   Care Plan : Depression (Adult)  Updates made by [name], LCSW since 11/12/2020 12:00 AM    Problem: Symptoms (Depression)     Long-Range Goal: Symptoms Monitored and Managed   Start Date: 10/15/2020  Expected End Date: 03/16/2021  Recent Progress: On track  Priority: High  Note:   Current Barriers:  [name] Mental Health Concerns  . Unable to independently cope with recent loss of sister and spouse  Clinical Social Work Clinical Goal(s):  [name] Over the next 90 days, patient will follow up with with a mental health therapist for ongoing mental health counseling*  as directed by SW  Interventions: . 1:1 collaboration with [name], MD regarding development and update of comprehensive plan of care as evidenced by provider attestation and co-signature . Inter-disciplinary care team collaboration (see longitudinal plan of care) . Return call from [facility] to confirm that they have received the referral and have reached out to patient 3x to schedule initial assessment with only one return call but no further follow up. . This Education officer, museum agreed to contact patient to reinforce need to return call to schedule initial intake appointment  Patient Goals/Self-Care Activities Over the next 90 days, patient will:   - Patient will self administer medications as prescribed Patient will attend all scheduled provider appointments Patient will continue to perform ADL's independently Patient will continue to perform IADL's independently Patient will call provider office for new concerns or questions  Follow up Plan: SW will follow up with patient by phone over the next 5-7 business days            Follow Up Plan: SW will follow up with patient by phone over the next 5-7 business days       Elliot Gurney, [place] Worker  [facility] [phone]

## 2020-11-12 NOTE — Telephone Encounter (Signed)
Called patient on her cell phone which is the only phone available and left a message that if she would like to get her next yearly CT scan if she can give [place] a call [phone].

## 2020-11-13 ENCOUNTER — Encounter: Payer: Self-pay | Admitting: Physician Assistant

## 2020-11-13 ENCOUNTER — Other Ambulatory Visit: Payer: Self-pay

## 2020-11-13 ENCOUNTER — Ambulatory Visit (INDEPENDENT_AMBULATORY_CARE_PROVIDER_SITE_OTHER): Payer: PPO | Admitting: Physician Assistant

## 2020-11-13 VITALS — BP 130/74 | HR 62 | Temp 98.0°F | Wt 138.5 lb

## 2020-11-13 DIAGNOSIS — M545 Low back pain, unspecified: Secondary | ICD-10-CM

## 2020-11-13 DIAGNOSIS — G8929 Other chronic pain: Secondary | ICD-10-CM

## 2020-11-13 DIAGNOSIS — R12 Heartburn: Secondary | ICD-10-CM

## 2020-11-13 DIAGNOSIS — F419 Anxiety disorder, unspecified: Secondary | ICD-10-CM

## 2020-11-13 NOTE — Patient Instructions (Signed)
Back Pain in Pregnancy Back pain during pregnancy is common. Back pain may be caused by several factors that are related to changes during your pregnancy. Follow these instructions at home: Managing pain, stiffness, and swelling  If directed, for sudden (acute) back pain, put ice on the painful area. ? Put ice in a plastic bag. ? Place a towel between your skin and the bag. ? Leave the ice on for 20 minutes, 2-3 times per day.  If directed, apply heat to the affected area before you exercise. Use the heat source that your health care provider recommends, such as a moist heat pack or a heating pad. ? Place a towel between your skin and the heat source. ? Leave the heat on for 20-30 minutes. ? Remove the heat if your skin turns bright red. This is especially important if you are unable to feel pain, heat, or cold. You may have a greater risk of getting burned.  If directed, massage the affected area.      Activity  Exercise as told by your health care provider. Gentle exercise is the best way to prevent or manage back pain.  Listen to your body when lifting. If lifting hurts, ask for help or bend your knees. This uses your leg muscles instead of your back muscles.  Squat down when picking up something from the floor. Do not bend over.  Only use bed rest for short periods as told by your health care provider. Bed rest should only be used for the most severe episodes of back pain. Standing, sitting, and lying down  Do not stand in one place for long periods of time.  Use good posture when sitting. Make sure your head rests over your shoulders and is not hanging forward. Use a pillow on your lower back if necessary.  Try sleeping on your side, preferably the left side, with a pregnancy support pillow or 1-2 regular pillows between your legs. ? If you have back pain after a night's rest, your bed may be too soft. ? A firm mattress may provide more support for your back during  pregnancy. General instructions  Do not wear high heels.  Eat a healthy diet. Try to gain weight within your health care provider's recommendations.  Use a maternity girdle, elastic sling, or back brace as told by your health care provider.  Take over-the-counter and prescription medicines only as told by your health care provider.  Work with a physical therapist or massage therapist to find ways to manage back pain. Acupuncture or massage therapy may be helpful.  Keep all follow-up visits as told by your health care provider. This is important. Contact a health care provider if:  Your back pain interferes with your daily activities.  You have increasing pain in other parts of your body. Get help right away if:  You develop numbness, tingling, weakness, or problems with the use of your arms or legs.  You develop severe back pain that is not controlled with medicine.  You have a change in bowel or bladder control.  You develop shortness of breath, dizziness, or you faint.  You develop nausea, vomiting, or sweating.  You have back pain that is a rhythmic, cramping pain similar to labor pains. Labor pain is usually 1-2 minutes apart, lasts for about 1 minute, and involves a bearing down feeling or pressure in your pelvis.  You have back pain and your water breaks or you have vaginal bleeding.  You have back pain or  numbness that travels down your leg.  Your back pain developed after you fell.  You develop pain on one side of your back.  You see blood in your urine.  You develop skin blisters in the area of your back pain. Summary  Back pain may be caused by several factors that are related to changes during your pregnancy.  Follow instructions as told by your health care provider for managing pain, stiffness, and swelling.  Exercise as told by your health care provider. Gentle exercise is the best way to prevent or manage back pain.  Take over-the-counter and  prescription medicines only as told by your health care provider.  Keep all follow-up visits as told by your health care provider. This is important. This information is not intended to replace advice given to you by your health care provider. Make sure you discuss any questions you have with your health care provider. Document Revised: 12/26/2018 Document Reviewed: 02/22/2018 Elsevier Patient Education  [place].

## 2020-11-13 NOTE — Chronic Care Management (AMB) (Signed)
Chronic Care Management    Clinical Social Work Note  11/13/2020 Name: Tara Scott MRN: [id number] DOB: 02/29/1961  Tara Scott is a 60 y.o. year old female who is a primary care patient of Bacigalupo, Dionne Bucy, MD. The CCM team was consulted to assist the patient with chronic disease management and/or care coordination needs related to: Mental Health Counseling and Resources.   Engaged with patient by telephone for follow up visit in response to provider referral for social work chronic care management and care coordination services.   Consent to Services:  The patient was given information about Chronic Care Management services, agreed to services, and gave verbal consent prior to initiation of services.  Please see initial visit note for detailed documentation.   Patient agreed to services and consent obtained.   Assessment: Review of patient past medical history, allergies, medications, and health status, including review of relevant consultants reports was performed today as part of a comprehensive evaluation and provision of chronic care management and care coordination services.     SDOH (Social Determinants of Health) assessments and interventions performed:    Advanced Directives Status: Not addressed in this encounter.  CCM Care Plan  Allergies  Allergen Reactions  . Anesthetics, Halogenated Other (See Comments)    Other reaction(s): Other (See Comments)  . Ciprofloxacin     swelling  . Methimazole     hives  . Oxycodone-Acetaminophen Nausea And Vomiting  . Penicillins Other (See Comments)    RXN AS A CHILD    Outpatient Encounter Medications as of 11/12/2020  Medication Sig Note  . busPIRone (BUSPAR) 7.5 MG tablet Take 1 tablet (7.5 mg total) by mouth 2 (two) times daily. (Patient not taking: No sig reported) 10/16/2020: Has not started yet.  . cholecalciferol (VITAMIN D3) 25 MCG (1000 UNIT) tablet Take 1,000 Units by mouth daily.   . citalopram (CELEXA) 40 MG  tablet TAKE 1 TABLET BY MOUTH EVERY DAY FOR DEPRESSION   . levothyroxine (SYNTHROID) 100 MCG tablet TAKE 1 TABLET BY MOUTH DAILY BEFORE BREAKFAST.   [name] omeprazole (PRILOSEC) 20 MG capsule Take 1 capsule (20 mg total) by mouth daily.    No facility-administered encounter medications on file as of 11/12/2020.    Patient Active Problem List   Diagnosis Date Noted  . Esophageal dysphagia 11/03/2020  . Postablative hypothyroidism 09/04/2019  . Tobacco use disorder 09/04/2019  . Benign essential tremor 12/26/2015  . Anxiety 03/14/2015  . Central core disease ([place]) 03/14/2015  . Dizziness 03/14/2015  . MD (muscular dystrophy) ([place]) 03/14/2015  . Avitaminosis D 04/16/2010  . Constipation 02/17/2010  . Goiter, nontoxic, multinodular 11/04/2009  . Absence of menstruation 08/18/2009  . Acid reflux 12/16/2008  . Cannot sleep 11/17/2008  . Adnexal pain 10/04/2007  . MDD (major depressive disorder) 09/01/2007    Conditions to be addressed/monitored:  Mental Health Concerns   Care Plan : Depression (Adult)  Updates made by [name], LCSW since 11/13/2020 12:00 AM    Problem: Symptoms (Depression)     Long-Range Goal: Symptoms Monitored and Managed   Start Date: 10/15/2020  Expected End Date: 03/16/2021  Recent Progress: On track  Priority: High  Note:   Current Barriers:  [name] Mental Health Concerns  . Unable to independently cope with recent loss of sister and spouse  Clinical Social Work Clinical Goal(s):  [name] Over the next 90 days, patient will follow up with with a mental health therapist for ongoing mental health counseling* as directed by  SW  Interventions: . 1:1 collaboration with Brita Romp, Dionne Bucy, MD regarding development and update of comprehensive plan of care as evidenced by provider attestation and co-signature . Inter-disciplinary care team collaboration (see longitudinal plan of care) . Patient continues to discuss depressed mood related to her grief issues   . Patient also discussed concerns regarding increased back pain and has reached out to the providers office for advisement . Referral to [facility] discussed, patient has been contacted to schedule initial appointment, however patient has not scheduled to date. . Patient encouraged to contact [facility] to schedule initial counseling to address grief concerns. . Patient provided with emotional support and encouragement to follow up with mental health counseling and providers office regarding back pain .   Patient Goals/Self-Care Activities Over the next 90 days, patient will:   - Patient will self administer medications as prescribed Patient will attend all scheduled provider appointments Patient will continue to perform ADL's independently Patient will continue to perform IADL's independently Patient will call provider office for new concerns or questions  Follow up Plan: SW will follow up with patient by phone over the next 7-14 business days            Follow Up Plan: SW will follow up with patient by phone over the next 14 business days       [place], [place] Worker  [facility] [phone]

## 2020-11-13 NOTE — Patient Instructions (Signed)
Visit Information  PATIENT GOALS:  Goals Addressed            This Visit's Progress   . Begin and Stick with Counseling-Depression       Timeframe:  Long-Range Goal Priority:  High Start Date:   10/15/20                          Expected End Date:  04/14/21                     Follow Up Date 10/22/2020    - check out counseling-referral  made to [facility]-encouraged patient to call to schedule initial appointment - keep 90 percent of counseling appointment   Why is this important?    Beating depression may take some time.   If you don't feel better right away, don't give up on your treatment plan.    Notes:        The patient verbalized understanding of instructions, educational materials, and care plan provided today and declined offer to receive copy of patient instructions, educational materials, and care plan.   Telephone follow up appointment with care management team member scheduled for:11/27/20   Elliot Gurney, [place] Worker  [facility] [phone]

## 2020-11-14 ENCOUNTER — Telehealth: Payer: Self-pay | Admitting: Family Medicine

## 2020-11-14 NOTE — Telephone Encounter (Signed)
Patient states pharmacy advised taking  omeprazole (PRILOSEC) 20 MG capsule and  citalopram (CELEXA) 40 MG tablet can cause an increase in heart rate.   Pharmacist suggested patient to ask PCP to prescribed pepcid, patient would like alternate rx sent in. Patient states she does not feel comfortable taking  Omeprazole. Patient would like a follow up call    [id number] [place], [place] - [address] Phone:  [phone]  Fax:  [phone]

## 2020-11-17 ENCOUNTER — Telehealth: Payer: Self-pay | Admitting: *Deleted

## 2020-11-17 MED ORDER — FAMOTIDINE 20 MG PO TABS: 20.0000 mg | ORAL_TABLET | Freq: Two times a day (BID) | ORAL | 5 refills | Status: DC

## 2020-11-17 NOTE — Telephone Encounter (Signed)
   Chronic Care Management   Unsuccessful Call Note 11/17/2020 Name: Tara Scott MRN: [id number] DOB: 1961-05-06  Tara Scott is a 60  year old female who sees Lavon Paganini, MD for primary care. Dr. Brita Romp, MD asked the CCM team to consult the patient for Mental Health Counseling and Resources.     This social worker was unable to reach patient via telephone today for follow up call. I have left HIPAA compliant voicemail asking patient to return my call. (unsuccessful outreach #1).   Plan: Will follow-up within 7 business days via telephone.      Elliot Gurney, [place] Worker  [facility] [phone]

## 2020-11-17 NOTE — Telephone Encounter (Signed)
Pepcid Rx sent to pharmacy

## 2020-11-17 NOTE — Telephone Encounter (Signed)
Pt called asking if she could get a call back regarding the medicine interaction.  Her pharmacy told her Pepcid would not interfere  CB#  [phone].

## 2020-11-19 ENCOUNTER — Ambulatory Visit (INDEPENDENT_AMBULATORY_CARE_PROVIDER_SITE_OTHER): Payer: PPO | Admitting: *Deleted

## 2020-11-19 DIAGNOSIS — F419 Anxiety disorder, unspecified: Secondary | ICD-10-CM

## 2020-11-19 DIAGNOSIS — F4329 Adjustment disorder with other symptoms: Secondary | ICD-10-CM

## 2020-11-19 DIAGNOSIS — F4381 Prolonged grief disorder: Secondary | ICD-10-CM

## 2020-11-19 DIAGNOSIS — F329 Major depressive disorder, single episode, unspecified: Secondary | ICD-10-CM | POA: Diagnosis not present

## 2020-11-20 ENCOUNTER — Telehealth: Payer: Self-pay | Admitting: *Deleted

## 2020-11-20 ENCOUNTER — Telehealth: Payer: Self-pay

## 2020-11-20 NOTE — Chronic Care Management (AMB) (Signed)
Chronic Care Management    Clinical Social Work Note  11/20/2020 Name: AISHI COURTS MRN: [id number] DOB: 1961-02-05  ASTARIA NANEZ is a 60 y.o. year old female who is a primary care patient of Bacigalupo, Dionne Bucy, MD. The CCM team was consulted to assist the patient with chronic disease management and/or care coordination needs related to: Mental Health Counseling and Resources.   Collaboration with [facility]  for follow up on referral placed for ongoing mental health counseling in response to provider referral for social work chronic care management and care coordination services.   Consent to Services:  The patient was given information about Chronic Care Management services, agreed to services, and gave verbal consent prior to initiation of services.  Please see initial visit note for detailed documentation.   Patient agreed to services and consent obtained.   Assessment: Review of patient past medical history, allergies, medications, and health status, including review of relevant consultants reports was performed today as part of a comprehensive evaluation and provision of chronic care management and care coordination services.     SDOH (Social Determinants of Health) assessments and interventions performed:    Advanced Directives Status: Not addressed in this encounter.  CCM Care Plan  Allergies  Allergen Reactions  . Anesthetics, Halogenated Other (See Comments)    Other reaction(s): Other (See Comments)  . Ciprofloxacin     swelling  . Methimazole     hives  . Oxycodone-Acetaminophen Nausea And Vomiting  . Penicillins Other (See Comments)    RXN AS A CHILD    Outpatient Encounter Medications as of 11/19/2020  Medication Sig Note  . busPIRone (BUSPAR) 7.5 MG tablet Take 1 tablet (7.5 mg total) by mouth 2 (two) times daily. (Patient not taking: No sig reported) 10/16/2020: Has not started yet.  . cholecalciferol (VITAMIN D3) 25 MCG (1000 UNIT) tablet Take  1,000 Units by mouth daily.   . citalopram (CELEXA) 40 MG tablet TAKE 1 TABLET BY MOUTH EVERY DAY FOR DEPRESSION   . famotidine (PEPCID) 20 MG tablet Take 1 tablet (20 mg total) by mouth 2 (two) times daily.   [name] levothyroxine (SYNTHROID) 100 MCG tablet TAKE 1 TABLET BY MOUTH DAILY BEFORE BREAKFAST.    No facility-administered encounter medications on file as of 11/19/2020.    Patient Active Problem List   Diagnosis Date Noted  . Esophageal dysphagia 11/03/2020  . Postablative hypothyroidism 09/04/2019  . Tobacco use disorder 09/04/2019  . Benign essential tremor 12/26/2015  . Anxiety 03/14/2015  . Central core disease ([place]) 03/14/2015  . Dizziness 03/14/2015  . MD (muscular dystrophy) ([place]) 03/14/2015  . Avitaminosis D 04/16/2010  . Constipation 02/17/2010  . Goiter, nontoxic, multinodular 11/04/2009  . Absence of menstruation 08/18/2009  . Acid reflux 12/16/2008  . Cannot sleep 11/17/2008  . Adnexal pain 10/04/2007  . MDD (major depressive disorder) 09/01/2007    Conditions to be addressed/monitored: Depression; Mental Health Concerns   Care Plan : Depression (Adult)  Updates made by [name], LCSW since 11/20/2020 12:00 AM    Problem: Symptoms (Depression)     Long-Range Goal: Symptoms Monitored and Managed   Start Date: 10/15/2020  Expected End Date: 03/16/2021  This Visit's Progress: Not on track  Recent Progress: On track  Priority: High  Note:   Current Barriers:  [name] Mental Health Concerns  . Unable to independently cope with recent loss of sister and spouse  Clinical Social Work Clinical Goal(s):  [name] Over the next 90 days, patient  will follow up with with a mental health therapist for ongoing mental health counseling* as directed by SW  Interventions: . 1:1 collaboration with [name], MD regarding development and update of comprehensive plan of care as evidenced by provider attestation and co-signature . Inter-disciplinary care team collaboration  (see longitudinal plan of care) . Phone call from [place] who confirmed that she has contacted patient 3 times with no return call from patient . Patient's referral will be closed at this time   Patient Goals/Self-Care Activities Over the next 90 days, patient will:   - Patient will self administer medications as prescribed Patient will attend all scheduled provider appointments Patient will continue to perform ADL's independently Patient will continue to perform IADL's independently Patient will call provider office for new concerns or questions  Follow up Plan: SW will follow up with patient by phone over the next 7-14 business days            Follow Up Plan: SW will follow up with patient by phone over the next 5-7 business days       Elliot Gurney, [place] Worker  [facility] [phone]

## 2020-11-20 NOTE — Telephone Encounter (Signed)
   Chronic Care Management   Unsuccessful Call Note 11/20/2020 Name: KIEANA LIVESAY MRN: [id number] DOB: 1961/02/17  Vianna Venezia is a 60 year old female who sees Lavon Paganini, MD for primary care. Lavon Paganini, MD asked the CCM team to consult the patient for Mental Health Counseling and Resources.     This social worker was unable to reach patient via telephone today for follow up call. I have left HIPAA compliant voicemail asking patient to return my call. (unsuccessful outreach #3).   Plan: This Education officer, museum will not make any additional outreach calls to patient however will be happy to engage patient upon her return call.     Elliot Gurney, [place] Worker  [facility] [phone]

## 2020-11-20 NOTE — Telephone Encounter (Signed)
Attempted to contact patient for lung screening. Left detailed message for the patient to contact Shawn at [phone] to set up the ct scan.

## 2020-11-21 ENCOUNTER — Telehealth: Payer: Self-pay

## 2020-11-21 NOTE — Telephone Encounter (Signed)
11/21/20 Spoke with patient she stated she had no insurance changes for this year, she a current smoker about a half a pack a day for about 40 years client states she started in her 61's, denies having cancer or having chemo or radiation. Scheduled 12/02/20 @ 2 pm patient states she knows where its located on [address] did not need any directions. Sjc

## 2020-11-24 ENCOUNTER — Other Ambulatory Visit: Payer: Self-pay | Admitting: *Deleted

## 2020-11-24 DIAGNOSIS — F17200 Nicotine dependence, unspecified, uncomplicated: Secondary | ICD-10-CM

## 2020-11-24 DIAGNOSIS — Z122 Encounter for screening for malignant neoplasm of respiratory organs: Secondary | ICD-10-CM

## 2020-11-24 DIAGNOSIS — Z87891 Personal history of nicotine dependence: Secondary | ICD-10-CM

## 2020-11-24 NOTE — Progress Notes (Signed)
Contacted and scheduled for annual lung screening scan. Patient is a current smoker with a 40 pack year history.

## 2020-11-27 ENCOUNTER — Telehealth: Payer: Self-pay

## 2020-12-02 ENCOUNTER — Ambulatory Visit: Admission: RE | Admit: 2020-12-02 | Payer: PPO | Source: Ambulatory Visit

## 2020-12-09 ENCOUNTER — Ambulatory Visit
Admission: RE | Admit: 2020-12-09 | Discharge: 2020-12-09 | Disposition: A | Discharge status: Home / Self Care | Payer: PPO | Source: Ambulatory Visit | Attending: Nurse Practitioner | Admitting: Nurse Practitioner

## 2020-12-09 ENCOUNTER — Other Ambulatory Visit: Payer: Self-pay

## 2020-12-09 DIAGNOSIS — Z122 Encounter for screening for malignant neoplasm of respiratory organs: Secondary | ICD-10-CM | POA: Insufficient documentation

## 2020-12-09 DIAGNOSIS — F17200 Nicotine dependence, unspecified, uncomplicated: Secondary | ICD-10-CM | POA: Insufficient documentation

## 2020-12-09 DIAGNOSIS — F17210 Nicotine dependence, cigarettes, uncomplicated: Secondary | ICD-10-CM | POA: Diagnosis not present

## 2020-12-09 DIAGNOSIS — Z87891 Personal history of nicotine dependence: Secondary | ICD-10-CM | POA: Insufficient documentation

## 2020-12-15 ENCOUNTER — Encounter: Payer: Self-pay | Admitting: *Deleted

## 2020-12-24 ENCOUNTER — Other Ambulatory Visit: Payer: Self-pay | Admitting: Physician Assistant

## 2020-12-24 DIAGNOSIS — F419 Anxiety disorder, unspecified: Secondary | ICD-10-CM

## 2021-01-29 ENCOUNTER — Other Ambulatory Visit: Payer: Self-pay | Admitting: Family Medicine

## 2021-01-30 ENCOUNTER — Telehealth: Payer: Self-pay

## 2021-01-30 NOTE — Telephone Encounter (Signed)
Called patient to reschedule appointment that she has 05/23 no answr LMTCB

## 2021-02-02 ENCOUNTER — Telehealth: Payer: Self-pay

## 2021-02-02 NOTE — Telephone Encounter (Signed)
Appt scheduled for 02/09/21. 

## 2021-02-02 NOTE — Telephone Encounter (Signed)
Copied from [place] [id number]. Topic: Appointment Scheduling - Scheduling Inquiry for Clinic >> Jan 30, 2021  2:30 PM Lennox Solders wrote: Reason for CRM: Pt is calling and would like to see dr b on 02-03-2021. Pt is having trouble sleeping and would like to get her tsh recheck

## 2021-02-09 ENCOUNTER — Ambulatory Visit: Payer: Self-pay | Admitting: Family Medicine

## 2021-03-27 ENCOUNTER — Telehealth: Payer: Self-pay | Admitting: Family Medicine

## 2021-03-27 MED ORDER — MELOXICAM 7.5 MG PO TABS: 7.5000 mg | ORAL_TABLET | Freq: Every day | ORAL | 0 refills | Status: DC

## 2021-03-27 NOTE — Telephone Encounter (Signed)
Medication Refill - Medication:meloxicam (due to hip pain) patient would like request expedited.   Has the patient contacted their pharmacy? No.  (Agent: If yes, when and what did the pharmacy advise?) Patient states she is out therefore did not call pharmacy due to no refills   Preferred Pharmacy (with phone number or street name):  [id number] [place], [place] - [address] Phone:  [phone]  Fax:  [phone]     Agent: Please be advised that RX refills may take up to 3 business days. We ask that you follow-up with your pharmacy.

## 2021-03-27 NOTE — Telephone Encounter (Signed)
Ok to refill Meloxicam

## 2021-03-27 NOTE — Addendum Note (Signed)
Addended by: Minette Headland on: 03/27/2021 11:40 AM   Modules accepted: Orders

## 2021-04-01 ENCOUNTER — Other Ambulatory Visit: Payer: Self-pay | Admitting: Family Medicine

## 2021-04-01 NOTE — Telephone Encounter (Signed)
Requested medication (s) are due for refill today: yes  Requested medication (s) are on the active medication list: yes   Last refill: 12/28/2020  Future visit scheduled:no  Notes to clinic: Failed protocol:  TSH needs to be rechecked within 3 months after an abnormal result. Refill until TSH is due.   Requested Prescriptions  Pending Prescriptions Disp Refills   levothyroxine (SYNTHROID) 100 MCG tablet [Pharmacy Med Name: LEVOTHYROXINE 100 MCG TABLET] 90 tablet 1    Sig: TAKE 1 TABLET BY MOUTH EVERY DAY BEFORE BREAKFAST      Endocrinology:  Hypothyroid Agents Failed - 04/01/2021  9:01 AM      Failed - TSH needs to be rechecked within 3 months after an abnormal result. Refill until TSH is due.      Passed - TSH in normal range and within 360 days    TSH  Date Value Ref Range Status  10/01/2020 3.950 0.450 - 4.500 uIU/mL Final          Passed - Valid encounter within last 12 months    Recent Outpatient Visits           4 months ago Chronic low back pain without sciatica, unspecified back pain laterality   [place], [place]   4 months ago [place], Dionne Bucy, MD   6 months ago [place], [place]   8 months ago Acute right-sided low back pain without sciatica   [facility], Dionne Bucy, MD   1 year ago Postablative hypothyroidism   [place], [place], [place]

## 2021-04-03 ENCOUNTER — Other Ambulatory Visit: Payer: Self-pay | Admitting: Family Medicine

## 2021-04-03 DIAGNOSIS — F419 Anxiety disorder, unspecified: Secondary | ICD-10-CM

## 2021-04-13 ENCOUNTER — Other Ambulatory Visit: Payer: Self-pay | Admitting: Family Medicine

## 2021-04-13 DIAGNOSIS — F419 Anxiety disorder, unspecified: Secondary | ICD-10-CM

## 2021-04-22 ENCOUNTER — Emergency Department: Payer: PPO

## 2021-04-22 ENCOUNTER — Inpatient Hospital Stay
Admission: EM | Admit: 2021-04-22 | Discharge: 2021-04-27 | DRG: 522 | Disposition: A | Discharge status: Skilled Nursing Facility | Payer: PPO | Attending: Hospitalist | Admitting: Hospitalist

## 2021-04-22 ENCOUNTER — Other Ambulatory Visit: Payer: Self-pay

## 2021-04-22 ENCOUNTER — Encounter: Payer: Self-pay | Admitting: Emergency Medicine

## 2021-04-22 DIAGNOSIS — R112 Nausea with vomiting, unspecified: Secondary | ICD-10-CM | POA: Diagnosis not present

## 2021-04-22 DIAGNOSIS — Z743 Need for continuous supervision: Secondary | ICD-10-CM | POA: Diagnosis not present

## 2021-04-22 DIAGNOSIS — S7292XA Unspecified fracture of left femur, initial encounter for closed fracture: Secondary | ICD-10-CM | POA: Diagnosis not present

## 2021-04-22 DIAGNOSIS — Z20822 Contact with and (suspected) exposure to covid-19: Secondary | ICD-10-CM | POA: Diagnosis present

## 2021-04-22 DIAGNOSIS — W1830XA Fall on same level, unspecified, initial encounter: Secondary | ICD-10-CM | POA: Diagnosis present

## 2021-04-22 DIAGNOSIS — R2689 Other abnormalities of gait and mobility: Secondary | ICD-10-CM | POA: Diagnosis not present

## 2021-04-22 DIAGNOSIS — R41841 Cognitive communication deficit: Secondary | ICD-10-CM | POA: Diagnosis not present

## 2021-04-22 DIAGNOSIS — S72001A Fracture of unspecified part of neck of right femur, initial encounter for closed fracture: Principal | ICD-10-CM | POA: Diagnosis present

## 2021-04-22 DIAGNOSIS — Z884 Allergy status to anesthetic agent status: Secondary | ICD-10-CM

## 2021-04-22 DIAGNOSIS — F419 Anxiety disorder, unspecified: Secondary | ICD-10-CM | POA: Diagnosis present

## 2021-04-22 DIAGNOSIS — W19XXXA Unspecified fall, initial encounter: Secondary | ICD-10-CM | POA: Diagnosis not present

## 2021-04-22 DIAGNOSIS — Z79890 Hormone replacement therapy (postmenopausal): Secondary | ICD-10-CM

## 2021-04-22 DIAGNOSIS — W19XXXD Unspecified fall, subsequent encounter: Secondary | ICD-10-CM | POA: Diagnosis not present

## 2021-04-22 DIAGNOSIS — Z880 Allergy status to penicillin: Secondary | ICD-10-CM | POA: Diagnosis not present

## 2021-04-22 DIAGNOSIS — Z419 Encounter for procedure for purposes other than remedying health state, unspecified: Secondary | ICD-10-CM

## 2021-04-22 DIAGNOSIS — M1611 Unilateral primary osteoarthritis, right hip: Secondary | ICD-10-CM | POA: Diagnosis not present

## 2021-04-22 DIAGNOSIS — E039 Hypothyroidism, unspecified: Secondary | ICD-10-CM | POA: Diagnosis not present

## 2021-04-22 DIAGNOSIS — Z79899 Other long term (current) drug therapy: Secondary | ICD-10-CM

## 2021-04-22 DIAGNOSIS — S7292XD Unspecified fracture of left femur, subsequent encounter for closed fracture with routine healing: Secondary | ICD-10-CM | POA: Diagnosis not present

## 2021-04-22 DIAGNOSIS — R279 Unspecified lack of coordination: Secondary | ICD-10-CM | POA: Diagnosis not present

## 2021-04-22 DIAGNOSIS — Z885 Allergy status to narcotic agent status: Secondary | ICD-10-CM | POA: Diagnosis not present

## 2021-04-22 DIAGNOSIS — Z888 Allergy status to other drugs, medicaments and biological substances status: Secondary | ICD-10-CM

## 2021-04-22 DIAGNOSIS — Y92009 Unspecified place in unspecified non-institutional (private) residence as the place of occurrence of the external cause: Secondary | ICD-10-CM | POA: Diagnosis not present

## 2021-04-22 DIAGNOSIS — F17210 Nicotine dependence, cigarettes, uncomplicated: Secondary | ICD-10-CM | POA: Diagnosis present

## 2021-04-22 DIAGNOSIS — M6281 Muscle weakness (generalized): Secondary | ICD-10-CM | POA: Diagnosis not present

## 2021-04-22 DIAGNOSIS — Z96641 Presence of right artificial hip joint: Secondary | ICD-10-CM | POA: Diagnosis not present

## 2021-04-22 DIAGNOSIS — Z96652 Presence of left artificial knee joint: Secondary | ICD-10-CM | POA: Diagnosis not present

## 2021-04-22 DIAGNOSIS — F411 Generalized anxiety disorder: Secondary | ICD-10-CM | POA: Diagnosis not present

## 2021-04-22 DIAGNOSIS — M25551 Pain in right hip: Secondary | ICD-10-CM | POA: Diagnosis present

## 2021-04-22 DIAGNOSIS — Z881 Allergy status to other antibiotic agents status: Secondary | ICD-10-CM

## 2021-04-22 DIAGNOSIS — R52 Pain, unspecified: Secondary | ICD-10-CM | POA: Diagnosis not present

## 2021-04-22 DIAGNOSIS — Z86718 Personal history of other venous thrombosis and embolism: Secondary | ICD-10-CM | POA: Diagnosis not present

## 2021-04-22 DIAGNOSIS — G7100 Muscular dystrophy, unspecified: Secondary | ICD-10-CM | POA: Diagnosis not present

## 2021-04-22 DIAGNOSIS — G8918 Other acute postprocedural pain: Secondary | ICD-10-CM

## 2021-04-22 DIAGNOSIS — R110 Nausea: Secondary | ICD-10-CM | POA: Diagnosis not present

## 2021-04-22 DIAGNOSIS — R1319 Other dysphagia: Secondary | ICD-10-CM | POA: Diagnosis not present

## 2021-04-22 DIAGNOSIS — Z471 Aftercare following joint replacement surgery: Secondary | ICD-10-CM | POA: Diagnosis not present

## 2021-04-22 DIAGNOSIS — Z720 Tobacco use: Secondary | ICD-10-CM | POA: Diagnosis not present

## 2021-04-22 DIAGNOSIS — I959 Hypotension, unspecified: Secondary | ICD-10-CM | POA: Diagnosis not present

## 2021-04-22 DIAGNOSIS — S72041A Displaced fracture of base of neck of right femur, initial encounter for closed fracture: Secondary | ICD-10-CM | POA: Diagnosis not present

## 2021-04-22 DIAGNOSIS — M25572 Pain in left ankle and joints of left foot: Secondary | ICD-10-CM | POA: Diagnosis not present

## 2021-04-22 DIAGNOSIS — F32A Depression, unspecified: Secondary | ICD-10-CM | POA: Diagnosis not present

## 2021-04-22 DIAGNOSIS — Z791 Long term (current) use of non-steroidal anti-inflammatories (NSAID): Secondary | ICD-10-CM | POA: Diagnosis not present

## 2021-04-22 DIAGNOSIS — E559 Vitamin D deficiency, unspecified: Secondary | ICD-10-CM | POA: Diagnosis not present

## 2021-04-22 DIAGNOSIS — M6259 Muscle wasting and atrophy, not elsewhere classified, multiple sites: Secondary | ICD-10-CM | POA: Diagnosis not present

## 2021-04-22 DIAGNOSIS — Z043 Encounter for examination and observation following other accident: Secondary | ICD-10-CM | POA: Diagnosis not present

## 2021-04-22 LAB — CBC WITH DIFFERENTIAL/PLATELET
Abs Immature Granulocytes: 0.05 10*3/uL (ref 0.00–0.07)
Basophils Absolute: 0.1 10*3/uL (ref 0.0–0.1)
Basophils Relative: 1 %
Eosinophils Absolute: 0.1 10*3/uL (ref 0.0–0.5)
Eosinophils Relative: 1 %
HCT: 37.2 % (ref 36.0–46.0)
Hemoglobin: 13 g/dL (ref 12.0–15.0)
Immature Granulocytes: 1 %
Lymphocytes Relative: 24 %
Lymphs Abs: 2.2 10*3/uL (ref 0.7–4.0)
MCH: 33.8 pg (ref 26.0–34.0)
MCHC: 34.9 g/dL (ref 30.0–36.0)
MCV: 96.6 fL (ref 80.0–100.0)
Monocytes Absolute: 0.7 10*3/uL (ref 0.1–1.0)
Monocytes Relative: 8 %
Neutro Abs: 6 10*3/uL (ref 1.7–7.7)
Neutrophils Relative %: 65 %
Platelets: 265 10*3/uL (ref 150–400)
RBC: 3.85 MIL/uL — ABNORMAL LOW (ref 3.87–5.11)
RDW: 12.8 % (ref 11.5–15.5)
WBC: 9 10*3/uL (ref 4.0–10.5)
nRBC: 0 % (ref 0.0–0.2)

## 2021-04-22 LAB — COMPREHENSIVE METABOLIC PANEL
ALT: 21 U/L (ref 0–44)
AST: 27 U/L (ref 15–41)
Albumin: 4 g/dL (ref 3.5–5.0)
Alkaline Phosphatase: 59 U/L (ref 38–126)
Anion gap: 9 (ref 5–15)
BUN: 20 mg/dL (ref 6–20)
CO2: 29 mmol/L (ref 22–32)
Calcium: 9.2 mg/dL (ref 8.9–10.3)
Chloride: 103 mmol/L (ref 98–111)
Creatinine, Ser: 0.49 mg/dL (ref 0.44–1.00)
GFR, Estimated: >60 mL/min (ref >60)
Glucose, Bld: 101 mg/dL — ABNORMAL HIGH (ref 70–99)
Potassium: 4 mmol/L (ref 3.5–5.1)
Sodium: 141 mmol/L (ref 135–145)
Total Bilirubin: 0.6 mg/dL (ref 0.3–1.2)
Total Protein: 7.1 g/dL (ref 6.5–8.1)

## 2021-04-22 LAB — PROTIME-INR
INR: 1 (ref 0.8–1.2)
Prothrombin Time: 13.4 seconds (ref 11.4–15.2)

## 2021-04-22 LAB — RESP PANEL BY RT-PCR (FLU A&B, COVID) ARPGX2
Influenza A by PCR: NEGATIVE
Influenza B by PCR: NEGATIVE
SARS Coronavirus 2 by RT PCR: NEGATIVE

## 2021-04-22 LAB — APTT: aPTT: 24 seconds (ref 24–36)

## 2021-04-22 LAB — MAGNESIUM: Magnesium: 1.9 mg/dL (ref 1.7–2.4)

## 2021-04-22 MED ORDER — HYDROMORPHONE HCL 1 MG/ML IJ SOLN
1.0000 mg | Freq: Once | INTRAMUSCULAR | Status: AC
Start: 2021-04-22 — End: 2021-04-22
  Administered 2021-04-22: 1 mg via INTRAVENOUS
  Filled 2021-04-22: qty 1

## 2021-04-22 MED ORDER — CEFAZOLIN SODIUM-DEXTROSE 2-4 GM/100ML-% IV SOLN
2.0000 g | Freq: Once | INTRAVENOUS | Status: DC
  Filled 2021-04-22: qty 100

## 2021-04-22 MED ORDER — ONDANSETRON HCL 4 MG/2ML IJ SOLN
4.0000 mg | Freq: Four times a day (QID) | INTRAMUSCULAR | Status: DC | PRN
  Administered 2021-04-22 – 2021-04-23 (×2): 4 mg via INTRAVENOUS
  Filled 2021-04-22 (×2): qty 2

## 2021-04-22 MED ORDER — HYDROMORPHONE HCL 1 MG/ML IJ SOLN
1.0000 mg | INTRAMUSCULAR | Status: DC | PRN
  Administered 2021-04-22 – 2021-04-23 (×3): 1 mg via INTRAVENOUS
  Filled 2021-04-22 (×3): qty 1

## 2021-04-22 MED ORDER — TRANEXAMIC ACID-NACL 1000-0.7 MG/100ML-% IV SOLN
1000.0000 mg | INTRAVENOUS | Status: AC
  Administered 2021-04-23: 1000 mg via INTRAVENOUS

## 2021-04-22 MED ORDER — ACETAMINOPHEN 650 MG RE SUPP
650.0000 mg | Freq: Four times a day (QID) | RECTAL | Status: DC | PRN
Start: 2021-04-22 — End: 2021-04-23

## 2021-04-22 MED ORDER — NALOXONE HCL 2 MG/2ML IJ SOSY
0.4000 mg | PREFILLED_SYRINGE | INTRAMUSCULAR | Status: DC | PRN
  Filled 2021-04-22: qty 2

## 2021-04-22 MED ORDER — KETOROLAC TROMETHAMINE 30 MG/ML IJ SOLN
15.0000 mg | Freq: Four times a day (QID) | INTRAMUSCULAR | Status: AC | PRN
Start: 2021-04-22 — End: 2021-04-25
  Administered 2021-04-23: 15 mg via INTRAVENOUS
  Filled 2021-04-22: qty 1

## 2021-04-22 MED ORDER — ACETAMINOPHEN 325 MG PO TABS: 650.0000 mg | ORAL_TABLET | Freq: Four times a day (QID) | ORAL | Status: DC | PRN

## 2021-04-22 NOTE — ED Provider Notes (Addendum)
[facility]  ____________________________________________   Event Date/Time   First MD Initiated Contact with Patient 04/22/21 1813     (approximate)  I have reviewed the triage vital signs and the nursing notes.   HISTORY  Chief Complaint Fall    HPI Tara Scott is a 60 y.o. female central cord syndrome, history of malignant hyperthermia, DVT presents with left hip pain.  Patient had a mechanical fall just prior to arrival.  She fell onto the right side.  Did not hit her head.  She has had significant pain of the right hip and has been unable to ambulate.  She is also having spasms in her right arm.  No weakness.          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    Malignant hyperthermia    Toxic multinodular goiter     Patient Active Problem List   Diagnosis Date Noted   Esophageal dysphagia 11/03/2020   Postablative hypothyroidism 09/04/2019   Tobacco use disorder 09/04/2019   Benign essential tremor 12/26/2015   Anxiety 03/14/2015   Central core disease ([place]) 03/14/2015   Dizziness 03/14/2015   MD (muscular dystrophy) ([place]) 03/14/2015   Avitaminosis D 04/16/2010   Constipation 02/17/2010   Goiter, nontoxic, multinodular 11/04/2009   Absence of menstruation 08/18/2009   Acid reflux 12/16/2008   Cannot sleep 11/17/2008   Adnexal pain 10/04/2007   MDD (major depressive disorder) 09/01/2007    Past Surgical History:  Procedure Laterality Date   ADENOIDECTOMY     CESAREAN SECTION     COLONOSCOPY WITH PROPOFOL N/A 03/08/2016   Procedure: COLONOSCOPY WITH PROPOFOL;  Surgeon: Manya Silvas,  MD;  Location: [facility];  Service: Endoscopy;  Laterality: N/A;   LAPAROTOMY  1984   OVARIAN CYST    Prior to Admission medications   Medication Sig Start Date End Date Taking? Authorizing Provider  busPIRone (BUSPAR) 7.5 MG tablet TAKE 1 TABLET BY MOUTH TWICE A DAY 04/03/21   Bacigalupo, Dionne Bucy, MD  cholecalciferol (VITAMIN D3) 25 MCG (1000 UNIT) tablet Take 1,000 Units by mouth daily.    [provider]  citalopram (CELEXA) 40 MG tablet TAKE 1 TABLET BY MOUTH EVERY DAY FOR DEPRESSION 04/13/21   Virginia Crews, MD  famotidine (PEPCID) 20 MG tablet Take 1 tablet (20 mg total) by mouth 2 (two) times daily. 11/17/20   Virginia Crews, MD  levothyroxine (SYNTHROID) 100 MCG tablet TAKE 1 TABLET BY MOUTH EVERY DAY BEFORE BREAKFAST 04/01/21   Bacigalupo, Dionne Bucy, MD  meloxicam (MOBIC) 7.5 MG tablet Take 1 tablet (7.5 mg total) by mouth daily. 03/27/21   Virginia Crews, MD    Allergies Anesthetics, halogenated; Ciprofloxacin; Methimazole; Oxycodone-acetaminophen; and Penicillins  Family History  Problem Relation Age of Onset   CVA Mother    Hypercholesterolemia Mother    Depression Mother    Rheum arthritis Mother    Deep vein thrombosis Mother    Bladder Cancer Father    Diverticulitis Father    Pulmonary embolism Father    Lung cancer Father    Deep vein thrombosis Sister  Colon polyps Sister    Atrial fibrillation Sister    Muscular dystrophy Sister    Cancer Sister        lung spreading all over   Diabetes Son        type 2    Social History Social History   Tobacco Use   Smoking status: Every Day    Packs/day: 0.50    Years: 40.00    Pack years: 20.00    Types: Cigarettes   Smokeless tobacco: Never  Vaping Use   Vaping Use: Never used  Substance Use Topics   Alcohol use: No    Alcohol/week: 0.0 standard drinks   Drug use: No    Review of Systems   Review of Systems  Musculoskeletal:  Positive for arthralgias and myalgias.   Neurological:  Negative for light-headedness, numbness and headaches.  All other systems reviewed and are negative.  Physical Exam Updated Vital Signs Pulse 93   Temp 98.4 F (36.9 C) (Oral)   Resp 18   Ht '5\' 2"'$  (1.575 m)   Wt 63.5 kg   SpO2 98%   BMI 25.61 kg/m   Physical Exam Vitals and nursing note reviewed.  Constitutional:      General: She is in acute distress.     Appearance: Normal appearance.     Comments: yelling in pain  HENT:     Head: Normocephalic and atraumatic.  Eyes:     General: No scleral icterus.    Conjunctiva/sclera: Conjunctivae normal.  Pulmonary:     Effort: Pulmonary effort is normal. No respiratory distress.     Breath sounds: No stridor.  Musculoskeletal:        General: No deformity or signs of injury.     Cervical back: Normal range of motion.     Comments: Right hip tenderness palpation, no obvious deformity, 2+ DP pulse, able to wiggle her toes  Skin:    General: Skin is dry.     Coloration: Skin is not jaundiced or pale.  Neurological:     General: No focal deficit present.     Mental Status: She is alert and oriented to person, place, and time. Mental status is at baseline.  Psychiatric:        Mood and Affect: Mood normal.        Behavior: Behavior normal.     LABS (all labs ordered are listed, but only abnormal results are displayed)  Labs Reviewed  CBC WITH DIFFERENTIAL/PLATELET - Abnormal; Notable for the following components:      Result Value   RBC 3.85 (*)    All other components within normal limits  COMPREHENSIVE METABOLIC PANEL - Abnormal; Notable for the following components:   Glucose, Bld 101 (*)    All other components within normal limits  RESP PANEL BY RT-PCR (FLU A&B, COVID) ARPGX2  PROTIME-INR  APTT   ____________________________________________  EKG  N/a ____________________________________________  RADIOLOGY Almeta Monas, personally viewed and evaluated these images (plain radiographs)  as part of my medical decision making, as well as reviewing the written report by the radiologist.  ED MD interpretation: I reviewed the chest x-ray which does not show any acute pathology, I reviewed the x-ray of the pelvis which shows a right femoral neck fracture    ____________________________________________   PROCEDURES  Procedure(s) performed (including Critical Care):  Procedures   ____________________________________________   INITIAL IMPRESSION / ASSESSMENT AND PLAN / ED COURSE  The patient is a 60 year old female who presents  after a fall with significant right hip pain.  She is neurovascular intact.  Her x-ray shows a right femoral neck fracture.  Discussed with orthopedics on-call, Dr. Posey Pronto, who asked that we give TXA.  Will admit to medicine.      ____________________________________________   FINAL CLINICAL IMPRESSION(S) / ED DIAGNOSES  Final diagnoses:  Closed fracture of neck of right femur, initial encounter [facility])     ED Discharge Orders     None        Note:  This document was prepared using Dragon voice recognition software and may include unintentional dictation errors.    Rada Hay, MD 04/22/21 2022    Rada Hay, MD 04/22/21 2026

## 2021-04-22 NOTE — ED Triage Notes (Signed)
Pt to ED via EMS from home. Per EMS, Pt was washing dishes and got tripped up and fell on right Hip. Pt was given 124mg of fentanyl and 4 mg zofran. EMS vitals 150/75. 92% on room air.   Pt son Lt. Brookes number [address]

## 2021-04-22 NOTE — Progress Notes (Signed)
Full consult note and discussion with patient to follow tomorrow AM. Of note, patient has central core myopathy and has had previous episode of malignant hypothermia under general anesthesia.  Called by ED staff. Imaging reviewed.  - Plan for surgery tomorrow (spinal anesthesia), likely afternoon.  - NPO after midnight - Hold anticoagulation - Admit to Hospitalist team.

## 2021-04-22 NOTE — Progress Notes (Signed)
Brief note regarding preliminary plan, with full H&P to follow:  60 year old female who is admitted with acute right femoral neck fracture after ground-level mechanical fall that occurred earlier today, without associated loss of consciousness.  Did not hit head as component of this fall.  Immediate development of acute right hip pain as right hip principal point of contact as [place] the above ground-level mechanical fall, with ensuing imaging performed in the ED this evening revealing evidence of acute right femoral neck fracture.  Dr. Posey Pronto of Orthopedic surgery is being consulted, with plan to take the patient to the OR tomorrow for definitive surgical intervention.  Dr. Posey Pronto requests the patient be n.p.o. at midnight and received 1 g of TXA, which has been ordered.  As needed IV Dilaudid.  N.p.o. at midnight order has been placed.  INR not elevated 1.0.  Check urinalysis.    Babs Bertin, DO Hospitalist

## 2021-04-22 NOTE — Plan of Care (Signed)

## 2021-04-22 NOTE — H&P (Signed)
History and Physical    PLEASE NOTE THAT DRAGON DICTATION SOFTWARE WAS USED IN THE CONSTRUCTION OF THIS NOTE.   Tara Scott [id number] DOB: [birth year] DOA: 04/22/2021  PCP: Virginia Crews, MD Patient coming from: home   I have personally briefly reviewed patient's old medical records in [place]  Chief Complaint: Right hip pain  HPI: Tara Scott is a 60 y.o. female with medical history significant for acquired hypothyroidism, chronic tobacco abuse, generalized anxiety disorder, who is admitted to [facility] on 04/22/2021 with acute right femoral neck fracture after presenting from home to [facility] ED complaining of acute right hip pain following ground-level fall.   The following history is provided by the patient as well as her son, who is present at bedside, in addition to my discussions with the emergency department physician and via chart review.  Patient reports tripping while attempting to ambulate at home, resulting in a ground level fall during which right hip was the principal point of contact with the floor below. As a result of this fall, the patient reports immediate development of sharp right hip pain, with radiation into the left groin. States that this pain has been constant since onset, with exacerbation when attempting to move the right lower extremity.  As a consequence of the associated intensity of this discomfort, reports that she is unable to bear weight on the right lower extremity at this time.  This is relative to baseline functional status in which the patient lives independently as well as baseline ambulatory status in which the patient reports the ability to ambulate without assistance, including no need for support devices.  Otherwise, denies any acute arthralgias or myalgias as a result of the above fall.  Denies any acute numbness or paresthesias in bilateral lower extremities, and confirms that her right hip representations a  native joint.  While the above fall was not witnessed, the patient conveys that she did not hit head as a component of this fall, and denies any associated loss of consciousness.  Denies any preceding or associated chest pain, shortness of breath, diaphoresis, palpitations, nausea, vomiting, dizziness, presyncope, or syncope.  Denies any subsequent headache, neck pain, blurry vision, or diplopia.  Not on any blood thinners as an outpatient, including no aspirin.  Denies any known history of coronary artery disease or CHF. denies any recent orthopnea, PND, or peripheral edema.   Denies any recent subjective fever, chills, rigors, or generalized myalgias. Denies any recent headache, neck stiffness, rhinitis, rhinorrhea, sore throat, sob, wheezing, cough, nausea, vomiting, abdominal pain, diarrhea, or rash. No recent traveling or known COVID-19 exposures. Denies dysuria, gross hematuria, or change in urinary urgency/frequency.    Of note, the patient conveys that she experienced a single episode of cardiopulmonary arrest under general anesthesia for a previous surgical procedure, and states that this arrest was complicated by malignant hyperthermia at the time when she was undergoing therapy for toxic multinodular goiter.  She now has acquired hypothyroidism and is on daily thyroid supplementation via Synthroid.     ED Course:  Vital signs in the ED were notable for the following: Temperature max 98.4, heart rate initially 93, which improved to 73 following interval administration of IV analgesia, as further detailed below; blood pressure 110/67; respiratory rate 16-18, oxygen saturation 98% on room air.  Labs were notable for the following: CMP was notable for the following: Sodium 141, potassium 4.0, creatinine 0.49, glucose 121.  CBC notable for white blood cell  count 9000, hemoglobin 13, platelets 265.  INR 1.0.  PTT 24.  Screening nasopharyngeal COVID-19 PCR was performed in the ED today and found to  be negative.  Plain films of the right hip showed angulated right femoral neck fracture.  Chest x-ray shows no evidence of acute cardiopulmonary process.  The EDP discussed the patient's case and imaging with the on-call orthopedic surgeon, Dr.Patel, Who recommended admission to the hospitalist service for further evaluation and management of presenting acute right femoral neck fracture, and plans to take the patient to the OR tomorrow (04/23/21) for definitive surgical intervention.  Dr.Patel requests that the patient be made n.p.o. at midnight, and that she receive 1 g of TXA.  While in the ED, the following were administered: Dilaudid 1 mg IV x2.  Additionally a TXA 1 g IV x1 was ordered.     Review of Systems: As per HPI otherwise 10 point review of systems negative.   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    Malignant hyperthermia    Toxic multinodular goiter     Past Surgical History:  Procedure Laterality Date   ADENOIDECTOMY     CESAREAN SECTION     COLONOSCOPY WITH PROPOFOL N/A 03/08/2016   Procedure: COLONOSCOPY WITH PROPOFOL;  Surgeon: Manya Silvas, MD;  [facility] ENDOSCOPY;  Service: Endoscopy;  Laterality: N/A;   LAPAROTOMY  1984   OVARIAN CYST    Social History:  reports that she has been smoking cigarettes. She has a 20.00 pack-year smoking history. She has never used smokeless tobacco. She reports that she does not drink alcohol and does not use drugs.   Allergies  Allergen Reactions   Anesthetics, Halogenated Other (See Comments)    Other reaction(s): Other (See Comments)   Ciprofloxacin     swelling    Methimazole     hives   Oxycodone-Acetaminophen Nausea And Vomiting   Penicillins Other (See Comments)    RXN AS A CHILD    Family History  Problem Relation Age of Onset   CVA Mother    Hypercholesterolemia Mother    Depression Mother    Rheum arthritis Mother    Deep vein thrombosis Mother    Bladder Cancer Father    Diverticulitis Father    Pulmonary embolism Father    Lung cancer Father    Deep vein thrombosis Sister    Colon polyps Sister    Atrial fibrillation Sister    Muscular dystrophy Sister    Cancer Sister        lung spreading all over   Diabetes Son        type 2    Family history reviewed and not pertinent    Prior to Admission medications   Medication Sig Start Date End Date Taking? Authorizing Provider  busPIRone (BUSPAR) 7.5 MG tablet TAKE 1 TABLET BY MOUTH TWICE A DAY 04/03/21   Bacigalupo, Dionne Bucy, MD  cholecalciferol (VITAMIN D3) 25 MCG (1000 UNIT) tablet Take 1,000 Units by mouth daily.    [provider]  citalopram (CELEXA) 40 MG tablet TAKE 1 TABLET BY MOUTH EVERY DAY FOR DEPRESSION 04/13/21  Virginia Crews, MD  famotidine (PEPCID) 20 MG tablet Take 1 tablet (20 mg total) by mouth 2 (two) times daily. 11/17/20   Virginia Crews, MD  levothyroxine (SYNTHROID) 100 MCG tablet TAKE 1 TABLET BY MOUTH EVERY DAY BEFORE BREAKFAST 04/01/21   Bacigalupo, Dionne Bucy, MD  meloxicam (MOBIC) 7.5 MG tablet Take 1 tablet (7.5 mg total) by mouth daily. 03/27/21   Virginia Crews, MD     Objective    Physical Exam: Vitals:   04/22/21 1808  Pulse: 93  Resp: 18  Temp: 98.4 F (36.9 C)  TempSrc: Oral  SpO2: 98%  Weight: 63.5 kg  Height: '5\' 2"'$  (1.575 m)    General: appears to be stated age; alert, oriented Skin: warm, dry, no rash Head:  AT/[place] Mouth:  Oral mucosa membranes appear moist, normal dentition Neck: supple; trachea midline Heart:  RRR; did not appreciate any M/R/G Lungs: CTAB, did not appreciate any wheezes, rales, or  rhonchi Abdomen: + BS; soft, ND, NT Vascular: 2+ pedal pulses b/l; 2+ radial pulses b/l Extremities: no peripheral edema, no muscle wasting; RLE externally rotated and shorter than the left lower extremity Neuro: Ability to fully assess strength in the right lower extremity is limited by the ED patient's current degree of pain control; otherwise sensation intact in the upper and lower extremities bilaterally.     Labs on Admission: I have personally reviewed following labs and imaging studies  CBC: Recent Labs  Lab 04/22/21 1808  WBC 9.0  NEUTROABS 6.0  HGB 13.0  HCT 37.2  MCV 96.6  PLT 99991111   Basic Metabolic Panel: Recent Labs  Lab 04/22/21 1808  NA 141  K 4.0  CL 103  CO2 29  GLUCOSE 101*  BUN 20  CREATININE 0.49  CALCIUM 9.2   GFR: Estimated Creatinine Clearance: 65.5 mL/min (by C-G formula based on SCr of 0.49 mg/dL). Liver Function Tests: Recent Labs  Lab 04/22/21 1808  AST 27  ALT 21  ALKPHOS 59  BILITOT 0.6  PROT 7.1  ALBUMIN 4.0   No results for input(s): LIPASE, AMYLASE in the last 168 hours. No results for input(s): AMMONIA in the last 168 hours. Coagulation Profile: Recent Labs  Lab 04/22/21 1808  INR 1.0   Cardiac Enzymes: No results for input(s): CKTOTAL, CKMB, CKMBINDEX, TROPONINI in the last 168 hours. BNP (last 3 results) No results for input(s): PROBNP in the last 8760 hours. HbA1C: No results for input(s): HGBA1C in the last 72 hours. CBG: No results for input(s): GLUCAP in the last 168 hours. Lipid Profile: No results for input(s): CHOL, HDL, LDLCALC, TRIG, CHOLHDL, LDLDIRECT in the last 72 hours. Thyroid Function Tests: No results for input(s): TSH, T4TOTAL, FREET4, T3FREE, THYROIDAB in the last 72 hours. Anemia Panel: No results for input(s): VITAMINB12, FOLATE, FERRITIN, TIBC, IRON, RETICCTPCT in the last 72 hours. Urine analysis:    Component Value Date/Time   BILIRUBINUR negative 10/05/2017 1447   PROTEINUR negative  10/05/2017 1447   UROBILINOGEN 0.2 10/05/2017 1447   NITRITE negative 10/05/2017 1447   LEUKOCYTESUR Trace (A) 10/05/2017 1447    Radiological Exams on Admission: DG Chest Port 1 View  Result Date: 04/22/2021 CLINICAL DATA:  Fall.  Right hip fracture EXAM: PORTABLE CHEST 1 VIEW COMPARISON:  10/20/2012 FINDINGS: The heart size and mediastinal contours are within normal limits. Both lungs are clear. The visualized skeletal structures are unremarkable. IMPRESSION: No active disease. Electronically Signed   By: Franchot Gallo M.D.   On: 04/22/2021  20:02   DG Hip Unilat W or Wo Pelvis 2-3 Views Right  Result Date: 04/22/2021 CLINICAL DATA:  Fall. EXAM: DG HIP (WITH OR WITHOUT PELVIS) 2-3V RIGHT COMPARISON:  11/29/2019 FINDINGS: Fracture right femoral neck with mild impaction angulation. Right hip joint space normal. No other fracture. IMPRESSION: Angulated right femoral neck fracture. Electronically Signed   By: Franchot Gallo M.D.   On: 04/22/2021 20:02     Assessment/Plan   Tara Scott is a 60 y.o. female with medical history significant for acquired hypothyroidism, chronic tobacco abuse, generalized anxiety disorder, who is admitted to [facility] on 04/22/2021 with acute right femoral neck fracture after presenting from home to [facility] ED complaining of acute right hip pain following ground-level fall.    Principal Problem:   Closed right hip fracture [facility]) Active Problems:   Anxiety   Fall at home, initial encounter   Tobacco abuse   Acquired hypothyroidism   Right hip pain     #) Acute right femoral neck fracture: confirmed via presenting plain films and stemming from ground level mechanical fall without associated loss of consciousness that occurred earlier on the day of admission, as further described above, resulting in immediate develop of acute right hip pain.  The patient's case/imaging were discussed with the on-call orthopedic surgeon, Dr. Posey Pronto,  who  recommended admission to the hospitalist service for further evaluation and management of acute right hip fracture, including preoperative medical optimization, and plan to take pt to the OR tomorrow (04/23/21) for definitive surgical management. Consistent with this, orthopedic surgery requests that the pt be made NPO at MN, and that she receive TXA 1 g IV x 1. At this time, the right lower extremity is neurovascularly intact, and the patient reports adequate pain control. Not on any blood thinners at home, including no aspirin.   Gupta Score for this patient in the context of anticipated aforementioned orthopedic surgery conveys a 0.14% perioperative risk for significant cardiac event. No evidence to suggest acutely decompensated heart failure or acute MI. Consequently, no absolute contraindications to proceeding with proposed orthopedic surgery at this time.  Additionally, of note, presenting INR and PTT found to be nonelevated.   Plan: Formal orthopedic surgery consult for definitive surgical management, with plan to take patient to the OR tomorrow (04/23/21). NPO after MN in anticipation of this procedure.  No pharmacologic anticoagulation leading up to this anticipated surgery. SCD's. TXA 1 g IV x 1 has been ordered, as above. Prn IV Dilaudid.  As needed IV Toradol.  Anticipate postoperative PT consult. Pre-op EKG has been ordered. Check 25-hydroxy vit D level.      #) Ground level mechanical fall: The patient reports a ground level mechanical fall earlier today in which she tripped without any associated loss of consciousness. Appears to be purely mechanical in nature, without clinical evidence at this time to suggest contributory dizziness, presyncope, syncope, or acute ischemic CVA. Does not appear to have hit head as component of this fall. presentation does not appear to be associated any acute neurologic deficits.  Not on any blood thinners.  While this fall appears to be purely mechanical in  nature, will also check urinalysis to evaluate for any underlying infectious contribution.   Plan: Check urinalysis, as above.  Repeat BMP and CBC with differential in the morning. Fall precautions. Anticipate postoperative PT consult.       #) Generalized anxiety disorder: On scheduled Celexa as an outpatient.  Plan: I have placed order  to resume home Celexa postoperatively.  Consider as needed Ativan for perioperative anxiety.       #) Acquired hypothyroidism: Documented history of such following treatment for toxic multinodular goiter.  Now on daily Synthroid.  Plan: Given patient's report of a history of malignant hyperthermia resulting in cardiopulmonary arrest, will check TSH at this time.  I have ordered postoperative resumption of home Synthroid.  Check urinary drug screen.        #) Chronic tobacco abuse: The patient knowledges that she is a current smoker, having smoked 0.5 packs/day over the last 40 years.  Plan: Counseled the patient on the importance of complete smoking discontinuation.     DVT prophylaxis: SCDs Code Status: Full code Family Communication: Case was discussed with the patient's son, who is present at bedside Disposition Plan: Per Rounding Team Consults called: Case again discussed with the on-call orthopedic surgeon, Dr. Posey Pronto, as further detailed above;   Admission status: Inpatient; MedSurg     Of note, this patient was added by me to the following Admit List/Treatment Team: armcadmits.      PLEASE NOTE THAT DRAGON DICTATION SOFTWARE WAS USED IN THE CONSTRUCTION OF THIS NOTE.   [facility] Pager [phone] From [place]  Otherwise, please contact night-coverage  [email or web address] Password [facility]   04/22/2021, 8:39 PM

## 2021-04-23 ENCOUNTER — Inpatient Hospital Stay: Payer: PPO | Admitting: Anesthesiology

## 2021-04-23 ENCOUNTER — Encounter: Payer: Self-pay | Admitting: Internal Medicine

## 2021-04-23 ENCOUNTER — Inpatient Hospital Stay: Payer: PPO

## 2021-04-23 ENCOUNTER — Encounter
Admission: EM | Disposition: A | Discharge status: Skilled Nursing Facility | Payer: Self-pay | Source: Home / Self Care | Attending: Hospitalist

## 2021-04-23 DIAGNOSIS — M25551 Pain in right hip: Secondary | ICD-10-CM | POA: Diagnosis present

## 2021-04-23 DIAGNOSIS — Z720 Tobacco use: Secondary | ICD-10-CM | POA: Diagnosis present

## 2021-04-23 DIAGNOSIS — E039 Hypothyroidism, unspecified: Secondary | ICD-10-CM | POA: Diagnosis present

## 2021-04-23 DIAGNOSIS — W19XXXA Unspecified fall, initial encounter: Secondary | ICD-10-CM

## 2021-04-23 HISTORY — PX: TOTAL HIP ARTHROPLASTY: SHX124

## 2021-04-23 LAB — COMPREHENSIVE METABOLIC PANEL
ALT: 27 U/L (ref 0–44)
AST: 41 U/L (ref 15–41)
Albumin: 4.1 g/dL (ref 3.5–5.0)
Alkaline Phosphatase: 66 U/L (ref 38–126)
Anion gap: 6 (ref 5–15)
BUN: 20 mg/dL (ref 6–20)
CO2: 31 mmol/L (ref 22–32)
Calcium: 8.9 mg/dL (ref 8.9–10.3)
Chloride: 102 mmol/L (ref 98–111)
Creatinine, Ser: 0.41 mg/dL — ABNORMAL LOW (ref 0.44–1.00)
GFR, Estimated: >60 mL/min (ref >60)
Glucose, Bld: 162 mg/dL — ABNORMAL HIGH (ref 70–99)
Potassium: 4.5 mmol/L (ref 3.5–5.1)
Sodium: 139 mmol/L (ref 135–145)
Total Bilirubin: 0.5 mg/dL (ref 0.3–1.2)
Total Protein: 7.8 g/dL (ref 6.5–8.1)

## 2021-04-23 LAB — CBC WITH DIFFERENTIAL/PLATELET
Abs Immature Granulocytes: 0.04 10*3/uL (ref 0.00–0.07)
Basophils Absolute: 0 10*3/uL (ref 0.0–0.1)
Basophils Relative: 0 %
Eosinophils Absolute: 0 10*3/uL (ref 0.0–0.5)
Eosinophils Relative: 0 %
HCT: 40.6 % (ref 36.0–46.0)
Hemoglobin: 13.4 g/dL (ref 12.0–15.0)
Immature Granulocytes: 0 %
Lymphocytes Relative: 4 %
Lymphs Abs: 0.5 10*3/uL — ABNORMAL LOW (ref 0.7–4.0)
MCH: 32.5 pg (ref 26.0–34.0)
MCHC: 33 g/dL (ref 30.0–36.0)
MCV: 98.5 fL (ref 80.0–100.0)
Monocytes Absolute: 0.5 10*3/uL (ref 0.1–1.0)
Monocytes Relative: 4 %
Neutro Abs: 10.2 10*3/uL — ABNORMAL HIGH (ref 1.7–7.7)
Neutrophils Relative %: 92 %
Platelets: 251 10*3/uL (ref 150–400)
RBC: 4.12 MIL/uL (ref 3.87–5.11)
RDW: 13 % (ref 11.5–15.5)
WBC: 11.3 10*3/uL — ABNORMAL HIGH (ref 4.0–10.5)
nRBC: 0 % (ref 0.0–0.2)

## 2021-04-23 LAB — MAGNESIUM: Magnesium: 2.2 mg/dL (ref 1.7–2.4)

## 2021-04-23 LAB — HIV ANTIBODY (ROUTINE TESTING W REFLEX): HIV Screen 4th Generation wRfx: NONREACTIVE

## 2021-04-23 LAB — VITAMIN D 25 HYDROXY (VIT D DEFICIENCY, FRACTURES): Vit D, 25-Hydroxy: 21.39 ng/mL — ABNORMAL LOW (ref 30–100)

## 2021-04-23 LAB — SURGICAL PCR SCREEN
MRSA, PCR: NEGATIVE
Staphylococcus aureus: NEGATIVE

## 2021-04-23 LAB — TYPE AND SCREEN
ABO/RH(D): A POS
Antibody Screen: NEGATIVE

## 2021-04-23 LAB — ABO/RH: ABO/RH(D): A POS

## 2021-04-23 LAB — GLUCOSE, CAPILLARY: Glucose-Capillary: 148 mg/dL — ABNORMAL HIGH (ref 70–99)

## 2021-04-23 LAB — TSH: TSH: 4.168 u[IU]/mL (ref 0.350–4.500)

## 2021-04-23 SURGERY — ARTHROPLASTY, HIP, TOTAL, ANTERIOR APPROACH
Anesthesia: General | Site: Hip | Laterality: Right

## 2021-04-23 MED ORDER — MAGNESIUM HYDROXIDE 400 MG/5ML PO SUSP
30.0000 mL | Freq: Every day | ORAL | Status: DC | PRN
  Filled 2021-04-23: qty 30

## 2021-04-23 MED ORDER — BISACODYL 10 MG RE SUPP: 10.0000 mg | Freq: Every day | RECTAL | Status: DC | PRN

## 2021-04-23 MED ORDER — ONDANSETRON HCL 4 MG/2ML IJ SOLN: 4.0000 mg | Freq: Once | INTRAMUSCULAR | Status: DC | PRN

## 2021-04-23 MED ORDER — ALUM & MAG HYDROXIDE-SIMETH 200-200-20 MG/5ML PO SUSP: 30.0000 mL | ORAL | Status: DC | PRN

## 2021-04-23 MED ORDER — PROPOFOL 500 MG/50ML IV EMUL
INTRAVENOUS | Status: DC | PRN
Start: 2021-04-23 — End: 2021-04-23
  Administered 2021-04-23: 120 ug/kg/min via INTRAVENOUS

## 2021-04-23 MED ORDER — ZOLPIDEM TARTRATE 5 MG PO TABS: 5.0000 mg | ORAL_TABLET | Freq: Every evening | ORAL | Status: DC | PRN

## 2021-04-23 MED ORDER — ROCURONIUM BROMIDE 100 MG/10ML IV SOLN
INTRAVENOUS | Status: DC | PRN
  Administered 2021-04-23: 100 mg via INTRAVENOUS

## 2021-04-23 MED ORDER — ACETAMINOPHEN 325 MG PO TABS
325.0000 mg | ORAL_TABLET | Freq: Four times a day (QID) | ORAL | Status: DC | PRN
  Administered 2021-04-24: 650 mg via ORAL

## 2021-04-23 MED ORDER — CEFAZOLIN SODIUM-DEXTROSE 2-4 GM/100ML-% IV SOLN
INTRAVENOUS | Status: AC
  Filled 2021-04-23: qty 100

## 2021-04-23 MED ORDER — CITALOPRAM HYDROBROMIDE 20 MG PO TABS
40.0000 mg | ORAL_TABLET | Freq: Every day | ORAL | Status: DC
  Administered 2021-04-24 – 2021-04-27 (×4): 40 mg via ORAL
  Filled 2021-04-23 (×4): qty 2

## 2021-04-23 MED ORDER — ONDANSETRON HCL 4 MG/2ML IJ SOLN: 4.0000 mg | Freq: Four times a day (QID) | INTRAMUSCULAR | Status: DC | PRN

## 2021-04-23 MED ORDER — DEXMEDETOMIDINE (PRECEDEX) IN NS 20 MCG/5ML (4 MCG/ML) IV SYRINGE
PREFILLED_SYRINGE | INTRAVENOUS | Status: DC | PRN
  Administered 2021-04-23: 4 ug via INTRAVENOUS

## 2021-04-23 MED ORDER — ACETAMINOPHEN 10 MG/ML IV SOLN
INTRAVENOUS | Status: AC
  Filled 2021-04-23: qty 100

## 2021-04-23 MED ORDER — ENOXAPARIN SODIUM 40 MG/0.4ML IJ SOSY
40.0000 mg | PREFILLED_SYRINGE | INTRAMUSCULAR | Status: DC
  Administered 2021-04-24 – 2021-04-27 (×4): 40 mg via SUBCUTANEOUS
  Filled 2021-04-23 (×4): qty 0.4

## 2021-04-23 MED ORDER — HYDROMORPHONE HCL 1 MG/ML IJ SOLN
INTRAMUSCULAR | Status: AC
  Filled 2021-04-23: qty 1

## 2021-04-23 MED ORDER — PHENYLEPHRINE HCL (PRESSORS) 10 MG/ML IV SOLN
INTRAVENOUS | Status: DC | PRN
  Administered 2021-04-23: 100 ug via INTRAVENOUS

## 2021-04-23 MED ORDER — ONDANSETRON HCL 4 MG/2ML IJ SOLN
INTRAMUSCULAR | Status: DC | PRN
  Administered 2021-04-23: 4 mg via INTRAVENOUS

## 2021-04-23 MED ORDER — ACETAMINOPHEN 500 MG PO TABS
1000.0000 mg | ORAL_TABLET | Freq: Four times a day (QID) | ORAL | Status: AC
  Administered 2021-04-23 – 2021-04-24 (×4): 1000 mg via ORAL
  Filled 2021-04-23 (×4): qty 2

## 2021-04-23 MED ORDER — FENTANYL CITRATE (PF) 100 MCG/2ML IJ SOLN
INTRAMUSCULAR | Status: DC | PRN
  Administered 2021-04-23: 100 ug via INTRAVENOUS

## 2021-04-23 MED ORDER — DEXAMETHASONE SODIUM PHOSPHATE 10 MG/ML IJ SOLN
INTRAMUSCULAR | Status: DC | PRN
  Administered 2021-04-23: 8 mg via INTRAVENOUS

## 2021-04-23 MED ORDER — SODIUM CHLORIDE 0.9 % IV SOLN: INTRAVENOUS | Status: DC

## 2021-04-23 MED ORDER — MIDAZOLAM HCL 2 MG/2ML IJ SOLN
INTRAMUSCULAR | Status: AC
  Filled 2021-04-23: qty 2

## 2021-04-23 MED ORDER — HYDROMORPHONE HCL 1 MG/ML IJ SOLN: 0.5000 mg | INTRAMUSCULAR | Status: DC | PRN

## 2021-04-23 MED ORDER — TRANEXAMIC ACID-NACL 1000-0.7 MG/100ML-% IV SOLN
INTRAVENOUS | Status: AC
  Filled 2021-04-23: qty 100

## 2021-04-23 MED ORDER — LEVOTHYROXINE SODIUM 50 MCG PO TABS
100.0000 ug | ORAL_TABLET | Freq: Every day | ORAL | Status: DC
  Administered 2021-04-24 – 2021-04-27 (×4): 100 ug via ORAL
  Filled 2021-04-23 (×4): qty 2

## 2021-04-23 MED ORDER — OXYCODONE HCL 5 MG PO TABS: 10.0000 mg | ORAL_TABLET | ORAL | Status: DC | PRN

## 2021-04-23 MED ORDER — FENTANYL CITRATE (PF) 100 MCG/2ML IJ SOLN
INTRAMUSCULAR | Status: AC
  Filled 2021-04-23: qty 2

## 2021-04-23 MED ORDER — LIDOCAINE HCL (CARDIAC) PF 100 MG/5ML IV SOSY
PREFILLED_SYRINGE | INTRAVENOUS | Status: DC | PRN
  Administered 2021-04-23: 80 mg via INTRAVENOUS

## 2021-04-23 MED ORDER — SODIUM CHLORIDE 0.9 % IV SOLN
INTRAVENOUS | Status: DC | PRN
  Administered 2021-04-23: 50 ug/min via INTRAVENOUS

## 2021-04-23 MED ORDER — PHENOL 1.4 % MT LIQD
1.0000 | OROMUCOSAL | Status: DC | PRN
  Filled 2021-04-23: qty 177

## 2021-04-23 MED ORDER — LACTATED RINGERS IV SOLN: INTRAVENOUS | Status: DC | PRN

## 2021-04-23 MED ORDER — PROPOFOL 10 MG/ML IV BOLUS
INTRAVENOUS | Status: AC
  Filled 2021-04-23: qty 20

## 2021-04-23 MED ORDER — PANTOPRAZOLE SODIUM 40 MG PO TBEC
40.0000 mg | DELAYED_RELEASE_TABLET | Freq: Every day | ORAL | Status: DC
  Administered 2021-04-23 – 2021-04-27 (×5): 40 mg via ORAL
  Filled 2021-04-23 (×5): qty 1

## 2021-04-23 MED ORDER — ACETAMINOPHEN 10 MG/ML IV SOLN: 1000.0000 mg | Freq: Once | INTRAVENOUS | Status: DC | PRN

## 2021-04-23 MED ORDER — BUPIVACAINE-EPINEPHRINE 0.25% -1:200000 IJ SOLN
INTRAMUSCULAR | Status: DC | PRN
  Administered 2021-04-23: 30 mL

## 2021-04-23 MED ORDER — CEFAZOLIN SODIUM-DEXTROSE 2-4 GM/100ML-% IV SOLN
2.0000 g | Freq: Four times a day (QID) | INTRAVENOUS | Status: AC
  Administered 2021-04-23 – 2021-04-24 (×2): 2 g via INTRAVENOUS
  Filled 2021-04-23 (×2): qty 100

## 2021-04-23 MED ORDER — OXYCODONE HCL 5 MG PO TABS
5.0000 mg | ORAL_TABLET | ORAL | Status: DC | PRN
  Administered 2021-04-24 – 2021-04-26 (×7): 10 mg via ORAL
  Administered 2021-04-26: 5 mg via ORAL
  Administered 2021-04-26 – 2021-04-27 (×3): 10 mg via ORAL
  Filled 2021-04-23 (×10): qty 2
  Filled 2021-04-23: qty 1

## 2021-04-23 MED ORDER — SUGAMMADEX SODIUM 200 MG/2ML IV SOLN
INTRAVENOUS | Status: DC | PRN
  Administered 2021-04-23: 300 mg via INTRAVENOUS

## 2021-04-23 MED ORDER — METHOCARBAMOL 1000 MG/10ML IJ SOLN
500.0000 mg | Freq: Four times a day (QID) | INTRAVENOUS | Status: DC | PRN
  Filled 2021-04-23: qty 5

## 2021-04-23 MED ORDER — BUPIVACAINE-EPINEPHRINE (PF) 0.25% -1:200000 IJ SOLN
INTRAMUSCULAR | Status: AC
  Filled 2021-04-23: qty 30

## 2021-04-23 MED ORDER — CEFAZOLIN SODIUM-DEXTROSE 2-4 GM/100ML-% IV SOLN
2.0000 g | Freq: Once | INTRAVENOUS | Status: AC
  Administered 2021-04-23: 2 g via INTRAVENOUS

## 2021-04-23 MED ORDER — MENTHOL 3 MG MT LOZG
1.0000 | LOZENGE | OROMUCOSAL | Status: DC | PRN
  Filled 2021-04-23 (×2): qty 9

## 2021-04-23 MED ORDER — 0.9 % SODIUM CHLORIDE (POUR BTL) OPTIME
TOPICAL | Status: DC | PRN
  Administered 2021-04-23: 500 mL

## 2021-04-23 MED ORDER — LACTATED RINGERS IV SOLN
INTRAVENOUS | Status: DC | PRN
Start: 2021-04-23 — End: 2021-04-23

## 2021-04-23 MED ORDER — ONDANSETRON HCL 4 MG PO TABS
4.0000 mg | ORAL_TABLET | Freq: Four times a day (QID) | ORAL | Status: DC | PRN
  Administered 2021-04-24 – 2021-04-27 (×10): 4 mg via ORAL
  Filled 2021-04-23 (×10): qty 1

## 2021-04-23 MED ORDER — PROPOFOL 10 MG/ML IV BOLUS
INTRAVENOUS | Status: DC | PRN
  Administered 2021-04-23: 150 mg via INTRAVENOUS

## 2021-04-23 MED ORDER — FENTANYL CITRATE (PF) 100 MCG/2ML IJ SOLN: 25.0000 ug | INTRAMUSCULAR | Status: DC | PRN

## 2021-04-23 MED ORDER — SODIUM CHLORIDE 0.9 % IV SOLN
INTRAVENOUS | Status: DC | PRN
  Administered 2021-04-23: 60 mL

## 2021-04-23 MED ORDER — HYDROMORPHONE HCL 1 MG/ML IJ SOLN
INTRAMUSCULAR | Status: DC | PRN
  Administered 2021-04-23: 1 mg via INTRAVENOUS

## 2021-04-23 MED ORDER — OXYCODONE-ACETAMINOPHEN 5-325 MG PO TABS: 1.0000 | ORAL_TABLET | ORAL | Status: DC | PRN

## 2021-04-23 MED ORDER — DIPHENHYDRAMINE HCL 12.5 MG/5ML PO ELIX: 12.5000 mg | ORAL_SOLUTION | ORAL | Status: DC | PRN

## 2021-04-23 MED ORDER — SODIUM CHLORIDE 0.9% FLUSH
INTRAVENOUS | Status: DC | PRN
  Administered 2021-04-23: 40 mL via INTRAVENOUS

## 2021-04-23 MED ORDER — METHOCARBAMOL 500 MG PO TABS
500.0000 mg | ORAL_TABLET | Freq: Four times a day (QID) | ORAL | Status: DC | PRN
  Filled 2021-04-23 (×3): qty 1

## 2021-04-23 MED ORDER — ACETAMINOPHEN 10 MG/ML IV SOLN
INTRAVENOUS | Status: DC | PRN
Start: 2021-04-23 — End: 2021-04-23
  Administered 2021-04-23: 1000 mg via INTRAVENOUS

## 2021-04-23 MED ORDER — DOCUSATE SODIUM 100 MG PO CAPS
100.0000 mg | ORAL_CAPSULE | Freq: Two times a day (BID) | ORAL | Status: DC
  Administered 2021-04-23 – 2021-04-27 (×7): 100 mg via ORAL
  Filled 2021-04-23 (×7): qty 1

## 2021-04-23 MED ORDER — MIDAZOLAM HCL 2 MG/2ML IJ SOLN
INTRAMUSCULAR | Status: DC | PRN
  Administered 2021-04-23: 2 mg via INTRAVENOUS

## 2021-04-23 SURGICAL SUPPLY — 64 items
BLADE SAGITTAL AGGR TOOTH XLG (BLADE) ×2 IMPLANT
BNDG COHESIVE 6X5 TAN ST LF (GAUZE/BANDAGES/DRESSINGS) ×6 IMPLANT
CANISTER SUCT 1200ML W/VALVE (MISCELLANEOUS) ×2 IMPLANT
CANISTER WOUND CARE 500ML ATS (WOUND CARE) ×2 IMPLANT
CEMENT BONE 40GM (Cement) ×4 IMPLANT
CEMENT RESTRICTOR DEPUY SZ 1 (Cement) ×2 IMPLANT
CHLORAPREP W/TINT 26 (MISCELLANEOUS) ×2 IMPLANT
COVER BACK TABLE REUSABLE LG (DRAPES) ×2 IMPLANT
DRAPE 3/4 80X56 (DRAPES) ×6 IMPLANT
DRAPE C-ARM XRAY 36X54 (DRAPES) ×2 IMPLANT
DRAPE INCISE IOBAN 66X60 STRL (DRAPES) IMPLANT
DRAPE POUCH INSTRU U-SHP 10X18 (DRAPES) ×2 IMPLANT
DRESSING SURGICEL FIBRLLR 1X2 (HEMOSTASIS) ×2 IMPLANT
DRSG MEPILEX SACRM 8.7X9.8 (GAUZE/BANDAGES/DRESSINGS) ×2 IMPLANT
DRSG OPSITE POSTOP 4X8 (GAUZE/BANDAGES/DRESSINGS) ×4 IMPLANT
DRSG SURGICEL FIBRILLAR 1X2 (HEMOSTASIS) ×4
ELECT BLADE 6.5 EXT (BLADE) ×2 IMPLANT
ELECT REM PT RETURN 9FT ADLT (ELECTROSURGICAL) ×2
ELECTRODE REM PT RTRN 9FT ADLT (ELECTROSURGICAL) ×1 IMPLANT
GAUZE 4X4 16PLY ~~LOC~~+RFID DBL (SPONGE) ×2 IMPLANT
GLOVE SURG SYN 9.0  PF PI (GLOVE) ×2
GLOVE SURG SYN 9.0 PF PI (GLOVE) ×2 IMPLANT
GLOVE SURG UNDER POLY LF SZ9 (GLOVE) ×2 IMPLANT
GOWN SRG 2XL LVL 4 RGLN SLV (GOWNS) ×1 IMPLANT
GOWN STRL NON-REIN 2XL LVL4 (GOWNS) ×1
GOWN STRL REUS W/ TWL LRG LVL3 (GOWN DISPOSABLE) ×1 IMPLANT
GOWN STRL REUS W/TWL LRG LVL3 (GOWN DISPOSABLE) ×1
HEMOVAC 400CC 10FR (MISCELLANEOUS) IMPLANT
HIP FEM HD S 28 (Head) ×2 IMPLANT
HOLDER FOLEY CATH W/STRAP (MISCELLANEOUS) ×2 IMPLANT
HOOD PEEL AWAY FLYTE STAYCOOL (MISCELLANEOUS) ×2 IMPLANT
IRRIGATION SURGIPHOR STRL (IV SOLUTION) IMPLANT
KIT PREVENA INCISION MGT 13 (CANNISTER) ×2 IMPLANT
LINER DM 28MM (Liner) ×2 IMPLANT
MANIFOLD NEPTUNE II (INSTRUMENTS) ×2 IMPLANT
MAT ABSORB  FLUID 56X50 GRAY (MISCELLANEOUS) ×1
MAT ABSORB FLUID 56X50 GRAY (MISCELLANEOUS) ×1 IMPLANT
NDL SAFETY ECLIPSE 18X1.5 (NEEDLE) ×1 IMPLANT
NEEDLE HYPO 18GX1.5 SHARP (NEEDLE) ×1
NEEDLE SPNL 20GX3.5 QUINCKE YW (NEEDLE) ×4 IMPLANT
NS IRRIG 1000ML POUR BTL (IV SOLUTION) ×2 IMPLANT
PACK HIP COMPR (MISCELLANEOUS) ×2 IMPLANT
SCALPEL PROTECTED #10 DISP (BLADE) ×4 IMPLANT
SHELL ACETABULAR DM  48MM (Shell) ×2 IMPLANT
SOL PREP PVP 2OZ (MISCELLANEOUS) ×2
SOLUTION PREP PVP 2OZ (MISCELLANEOUS) ×1 IMPLANT
SPONGE DRAIN TRACH 4X4 STRL 2S (GAUZE/BANDAGES/DRESSINGS) ×2 IMPLANT
SPONGE T-LAP 18X18 ~~LOC~~+RFID (SPONGE) ×4 IMPLANT
STAPLER SKIN PROX 35W (STAPLE) ×2 IMPLANT
STEM FEMORAL STD SZ0 (Stem) ×2 IMPLANT
STRAP SAFETY 5IN WIDE (MISCELLANEOUS) ×2 IMPLANT
SUT DVC 2 QUILL PDO  T11 36X36 (SUTURE) ×1
SUT DVC 2 QUILL PDO T11 36X36 (SUTURE) ×1 IMPLANT
SUT SILK 0 (SUTURE) ×1
SUT SILK 0 30XBRD TIE 6 (SUTURE) ×1 IMPLANT
SUT V-LOC 90 ABS DVC 3-0 CL (SUTURE) ×2 IMPLANT
SUT VIC AB 1 CT1 36 (SUTURE) ×2 IMPLANT
SYR 20ML LL LF (SYRINGE) ×2 IMPLANT
SYR 30ML LL (SYRINGE) ×2 IMPLANT
SYR 50ML LL SCALE MARK (SYRINGE) ×4 IMPLANT
SYR BULB IRRIG 60ML STRL (SYRINGE) ×2 IMPLANT
TAPE MICROFOAM 4IN (TAPE) ×2 IMPLANT
TOWEL OR 17X26 4PK STRL BLUE (TOWEL DISPOSABLE) ×2 IMPLANT
TRAY FOLEY MTR SLVR 16FR STAT (SET/KITS/TRAYS/PACK) ×2 IMPLANT

## 2021-04-23 NOTE — Consult Note (Signed)
Reason for Consult: Right femoral neck fracture Referring Physician: Dr. Devoria Albe Tara Scott is an 60 y.o. female.  HPI: Patient is a 60 year old who lives alone and is had a history of prior right hip pain seen in the emergency room who comes in after suffering a fall.  She has a displaced femoral neck fracture.  She has poor ambulation secondary to general weakness in the lower extremities.  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    Malignant hyperthermia    Toxic multinodular goiter     Past Surgical History:  Procedure Laterality Date   ADENOIDECTOMY     CESAREAN SECTION     COLONOSCOPY WITH PROPOFOL N/A 03/08/2016   Procedure: COLONOSCOPY WITH PROPOFOL;  Surgeon: Manya Silvas, MD;  Location: [facility] ENDOSCOPY;  Service: Endoscopy;  Laterality: N/A;   LAPAROTOMY  1984   OVARIAN CYST    Family History  Problem Relation Age of Onset   CVA Mother    Hypercholesterolemia Mother    Depression Mother    Rheum arthritis Mother    Deep vein thrombosis Mother    Bladder Cancer Father    Diverticulitis Father    Pulmonary embolism Father    Lung cancer Father    Deep vein thrombosis Sister    Colon polyps Sister    Atrial fibrillation Sister    Muscular dystrophy Sister    Cancer Sister        lung spreading all over   Diabetes Son        type 2    Social History:  reports that she has been smoking cigarettes. She has a 20.00 pack-year smoking history. She has never used smokeless tobacco. She reports that she does not drink alcohol and does not use drugs.  Allergies:  Allergies  Allergen Reactions   Anesthetics, Halogenated  Other (See Comments)    Other reaction(s): Other (See Comments)   Ciprofloxacin     swelling   Methimazole     hives   Oxycodone-Acetaminophen Nausea And Vomiting   Penicillins Other (See Comments)    RXN AS A CHILD    Medications: I have reviewed the patient's current medications.  Results for orders placed or performed during the hospital encounter of 04/22/21 (from the past 48 hour(s))  CBC with Differential     Status: Abnormal   Collection Time: 04/22/21  6:08 PM  Result Value Ref Range   WBC 9.0 4.0 - 10.5 K/uL   RBC 3.85 (L) 3.87 - 5.11 MIL/uL   Hemoglobin 13.0 12.0 - 15.0 g/dL   HCT 37.2 36.0 - 46.0 %   MCV 96.6 80.0 - 100.0 fL   MCH 33.8 26.0 - 34.0 pg   MCHC 34.9 30.0 - 36.0 g/dL   RDW 12.8 11.5 - 15.5 %   Platelets 265 150 - 400 K/uL   nRBC 0.0 0.0 - 0.2 %   Neutrophils Relative % 65 %   Neutro Abs 6.0 1.7 - 7.7 K/uL   Lymphocytes Relative 24 %   Lymphs Abs 2.2 0.7 - 4.0 K/uL   Monocytes Relative 8 %   Monocytes Absolute 0.7 0.1 -  1.0 K/uL   Eosinophils Relative 1 %   Eosinophils Absolute 0.1 0.0 - 0.5 K/uL   Basophils Relative 1 %   Basophils Absolute 0.1 0.0 - 0.1 K/uL   Immature Granulocytes 1 %   Abs Immature Granulocytes 0.05 0.00 - 0.07 K/uL    Comment: Performed at [facility], [address] [place], [place] [address]  Comprehensive metabolic panel     Status: Abnormal   Collection Time: 04/22/21  6:08 PM  Result Value Ref Range   Sodium 141 135 - 145 mmol/L   Potassium 4.0 3.5 - 5.1 mmol/L   Chloride 103 98 - 111 mmol/L   CO2 29 22 - 32 mmol/L   Glucose, Bld 101 (H) 70 - 99 mg/dL    Comment: Glucose reference range applies only to samples taken after fasting for at least 8 hours.   BUN 20 6 - 20 mg/dL   Creatinine, Ser 0.49 0.44 - 1.00 mg/dL   Calcium 9.2 8.9 - 10.3 mg/dL   Total Protein 7.1 6.5 - 8.1 g/dL   Albumin 4.0 3.5 - 5.0 g/dL   AST 27 15 - 41 U/L   ALT 21 0 - 44 U/L   Alkaline Phosphatase 59 38 - 126 U/L   Total  Bilirubin 0.6 0.3 - 1.2 mg/dL   GFR, Estimated >60 >60 mL/min    Comment: (NOTE) Calculated using the CKD-EPI Creatinine Equation (2021)    Anion gap 9 5 - 15    Comment: Performed at [facility], [address] [place], [place] [address]  Protime-INR     Status: None   Collection Time: 04/22/21  6:08 PM  Result Value Ref Range   Prothrombin Time 13.4 11.4 - 15.2 seconds   INR 1.0 0.8 - 1.2    Comment: (NOTE) INR goal varies based on device and disease states. Performed at [facility], [address] [place], [place] [address]   APTT     Status: None   Collection Time: 04/22/21  6:08 PM  Result Value Ref Range   aPTT 24 24 - 36 seconds    Comment: Performed at [facility], [address] [place], [place] [address]  Magnesium     Status: None   Collection Time: 04/22/21  6:08 PM  Result Value Ref Range   Magnesium 1.9 1.7 - 2.4 mg/dL    Comment: Performed at [facility], [address] [place],  [address]  Resp Panel by RT-PCR (Flu A&B, Covid) Nasopharyngeal Swab     Status: None   Collection Time: 04/22/21  7:48 PM   Specimen: Nasopharyngeal Swab; Nasopharyngeal(NP) swabs in vial transport medium  Result Value Ref Range   SARS Coronavirus 2 by RT PCR NEGATIVE NEGATIVE    Comment: (NOTE) SARS-CoV-2 target nucleic acids are NOT DETECTED.  The SARS-CoV-2 RNA is generally detectable in upper respiratory specimens during the acute phase of infection. The lowest concentration of SARS-CoV-2 viral copies this assay can detect is 138 copies/mL. A negative result does not preclude SARS-Cov-2 infection and should not be used as the sole basis for treatment or other patient management decisions. A negative result may occur with  improper specimen collection/handling, submission of specimen other than nasopharyngeal swab, presence of viral mutation(s) within the areas targeted by this assay, and inadequate number of  viral copies(<138 copies/mL). A negative result must be combined with clinical observations, patient history, and epidemiological information. The expected result is Negative.  Fact Sheet for Patients:  EntrepreneurPulse.com.au  Fact Sheet for Healthcare Providers:  IncredibleEmployment.be  This test is no t yet approved or cleared by the [place] FDA and  has been authorized for detection and/or diagnosis of SARS-CoV-2 by FDA under an Emergency Use Authorization (EUA). This EUA will remain  in effect (meaning this test can be used) for the duration of the COVID-19 declaration under Section 564(b)(1) of the Act, 21 U.S.C.section 360bbb-3(b)(1), unless the authorization is terminated  or revoked sooner.       Influenza A by PCR NEGATIVE NEGATIVE   Influenza B by PCR NEGATIVE NEGATIVE    Comment: (NOTE) The Xpert Xpress SARS-CoV-2/FLU/RSV plus assay is intended as an aid in the diagnosis of influenza from Nasopharyngeal swab specimens and should not be used as a sole basis for treatment. Nasal washings and aspirates are unacceptable for Xpert Xpress SARS-CoV-2/FLU/RSV testing.  Fact Sheet for Patients: EntrepreneurPulse.com.au  Fact Sheet for Healthcare Providers: IncredibleEmployment.be  This test is not yet approved or cleared by the [place] FDA and has been authorized for detection and/or diagnosis of SARS-CoV-2 by FDA under an Emergency Use Authorization (EUA). This EUA will remain in effect (meaning this test can be used) for the duration of the COVID-19 declaration under Section 564(b)(1) of the Act, 21 U.S.C. section 360bbb-3(b)(1), unless the authorization is terminated or revoked.  Performed at [facility], [address] [place], [place] [address]   Magnesium     Status: None   Collection Time: 04/23/21  4:19 AM  Result Value Ref Range   Magnesium 2.2 1.7 - 2.4 mg/dL     Comment: Performed at [facility], [address] [place], [place] [address]  Comprehensive metabolic panel     Status: Abnormal   Collection Time: 04/23/21  4:19 AM  Result Value Ref Range   Sodium 139 135 - 145 mmol/L   Potassium 4.5 3.5 - 5.1 mmol/L   Chloride 102 98 - 111 mmol/L   CO2 31 22 - 32 mmol/L   Glucose, Bld 162 (H) 70 - 99 mg/dL    Comment: Glucose reference range applies only to samples taken after fasting for at least 8 hours.   BUN 20 6 - 20 mg/dL   Creatinine, Ser 0.41 (L) 0.44 - 1.00 mg/dL   Calcium 8.9 8.9 - 10.3 mg/dL   Total Protein 7.8 6.5 - 8.1 g/dL   Albumin 4.1 3.5 - 5.0 g/dL   AST 41 15 - 41 U/L   ALT 27 0 - 44 U/L   Alkaline Phosphatase 66 38 - 126 U/L   Total Bilirubin 0.5 0.3 - 1.2 mg/dL   GFR, Estimated >60 >60 mL/min    Comment: (NOTE) Calculated using the CKD-EPI Creatinine Equation (2021)    Anion gap 6 5 - 15    Comment: Performed at [facility], [address] [place], [place] [address]  CBC with Differential/Platelet     Status: Abnormal   Collection Time: 04/23/21  4:19 AM  Result Value Ref Range   WBC 11.3 (H) 4.0 - 10.5 K/uL   RBC 4.12 3.87 - 5.11 MIL/uL   Hemoglobin 13.4 12.0 - 15.0 g/dL   HCT 40.6 36.0 - 46.0 %   MCV 98.5 80.0 - 100.0 fL   MCH 32.5 26.0 - 34.0 pg   MCHC 33.0 30.0 - 36.0 g/dL   RDW 13.0 11.5 - 15.5 %   Platelets 251 150 - 400 K/uL   nRBC 0.0 0.0 - 0.2 %  Neutrophils Relative % 92 %   Neutro Abs 10.2 (H) 1.7 - 7.7 K/uL   Lymphocytes Relative 4 %   Lymphs Abs 0.5 (L) 0.7 - 4.0 K/uL   Monocytes Relative 4 %   Monocytes Absolute 0.5 0.1 - 1.0 K/uL   Eosinophils Relative 0 %   Eosinophils Absolute 0.0 0.0 - 0.5 K/uL   Basophils Relative 0 %   Basophils Absolute 0.0 0.0 - 0.1 K/uL   Immature Granulocytes 0 %   Abs Immature Granulocytes 0.04 0.00 - 0.07 K/uL    Comment: Performed at [facility], [address] [place], [place] [address]  TSH     Status: None   Collection  Time: 04/23/21  4:19 AM  Result Value Ref Range   TSH 4.168 0.350 - 4.500 uIU/mL    Comment: Performed by a 3rd Generation assay with a functional sensitivity of <=0.01 uIU/mL. Performed at [facility], [address] [place], [place] [address]     DG Knee 2 Views Right  Result Date: 04/22/2021 CLINICAL DATA:  Hip fracture EXAM: RIGHT KNEE - 1-2 VIEW COMPARISON:  None. FINDINGS: No evidence of fracture, dislocation, or joint effusion. No evidence of arthropathy or other focal bone abnormality. Soft tissues are unremarkable. IMPRESSION: Negative. Electronically Signed   By: Donavan Foil M.D.   On: 04/22/2021 20:39   DG Chest Port 1 View  Result Date: 04/22/2021 CLINICAL DATA:  Fall.  Right hip fracture EXAM: PORTABLE CHEST 1 VIEW COMPARISON:  10/20/2012 FINDINGS: The heart size and mediastinal contours are within normal limits. Both lungs are clear. The visualized skeletal structures are unremarkable. IMPRESSION: No active disease. Electronically Signed   By: Franchot Gallo M.D.   On: 04/22/2021 20:02   DG Hip Unilat W or Wo Pelvis 2-3 Views Right  Result Date: 04/22/2021 CLINICAL DATA:  Fall. EXAM: DG HIP (WITH OR WITHOUT PELVIS) 2-3V RIGHT COMPARISON:  11/29/2019 FINDINGS: Fracture right femoral neck with mild impaction angulation. Right hip joint space normal. No other fracture. IMPRESSION: Angulated right femoral neck fracture. Electronically Signed   By: Franchot Gallo M.D.   On: 04/22/2021 20:02   DG Femur Min 2 Views Right  Result Date: 04/22/2021 CLINICAL DATA:  Hip fracture EXAM: RIGHT FEMUR 2 VIEWS COMPARISON:  Hip radiograph 04/22/2021 FINDINGS: Acute mildly displaced right femoral neck fracture. No femoral head dislocation. IMPRESSION: Acute right femoral neck fracture Electronically Signed   By: Donavan Foil M.D.   On: 04/22/2021 20:39    Review of Systems Blood pressure (!) 143/74, pulse 75, temperature 98.8 F (37.1 C), resp. rate 14, height '5\' 2"'$  (1.575 m), weight  67.1 kg, SpO2 97 %. Physical Exam The right leg is shortened and externally rotated she is able to flex extend her toes with palpable pulses.  Skin is intact around the right hip Assessment/Plan: Displaced femoral neck fracture with underlying osteoarthritis and chronic hip pain Plan is for cemented total hip through anterior approach . Risk benefits possible complications discussed.  Hessie Knows 04/23/2021, 7:22 AM

## 2021-04-23 NOTE — Anesthesia Procedure Notes (Signed)
Procedure Name: Intubation Date/Time: 04/23/2021 4:33 PM Performed by: Lerry Liner, CRNA Pre-anesthesia Checklist: Patient identified, Emergency Drugs available, Suction available and Patient being monitored Patient Re-evaluated:Patient Re-evaluated prior to induction Oxygen Delivery Method: Circle system utilized Preoxygenation: Pre-oxygenation with 100% oxygen Induction Type: IV induction, Cricoid Pressure applied and Rapid sequence Laryngoscope Size: McGraph and 3 Grade View: Grade I Tube type: Oral Tube size: 6.5 mm Number of attempts: 1 Airway Equipment and Method: Stylet, Oral airway and Video-laryngoscopy Placement Confirmation: ETT inserted through vocal cords under direct vision, positive ETCO2 and breath sounds checked- equal and bilateral Secured at: 22 cm Tube secured with: Tape Dental Injury: Teeth and Oropharynx as per pre-operative assessment

## 2021-04-23 NOTE — Plan of Care (Signed)
New admit to unit from the  ED, alert and oriented x 4, complains of severe pain to right lower extremity. Status post fall at home resulting in right femoral fracture. Provided prn pain medication and zofran to manage the nausea. Vitals stable, placed one unit of oxygen for patient comfort. NPO at midnight pending surgical intervention today. Will continue to monitor.  Problem: Education: Goal: Knowledge of General Education information will improve Description: Including pain rating scale, medication(s)/side effects and non-pharmacologic comfort measures Outcome: Progressing   Problem: Pain Managment: Goal: General experience of comfort will improve Outcome: Progressing   Problem: Safety: Goal: Ability to remain free from injury will improve Outcome: Progressing   Problem: Skin Integrity: Goal: Risk for impaired skin integrity will decrease Outcome: Progressing

## 2021-04-23 NOTE — Anesthesia Preprocedure Evaluation (Addendum)
Anesthesia Evaluation  Patient identified by MRN, date of birth, ID band Patient awake  General Assessment Comment:  Patient very emotional about needing anesthesia due to her prior history of MH. When trying to explain all the precautions we will take, she perseverates on the fact that "that's what they all said in 2011 and I still had the reaction." No records of the anesthetic in question available in computer system. Told patient we would use propofol and she frightfully asked, "isn't that what triggers it?" Assured her otherwise; she seems to have an incomplete understanding of the disease process. Explained in detail the precautions and that she should be safe.  Reviewed: Allergy & Precautions, NPO status , Patient's Chart, lab work & pertinent test results, reviewed documented beta blocker date and time   History of Anesthesia Complications (+) MALIGNANT HYPERTHERMIA and history of anesthetic complications  Airway Mallampati: II  TM Distance: >3 FB Neck ROM: Full    Dental no notable dental hx. (+) Chipped   Pulmonary neg sleep apnea, neg COPD, Current Smoker and Patient abstained from smoking.,    Pulmonary exam normal breath sounds clear to auscultation       Cardiovascular Exercise Tolerance: Good METS(-) hypertension+ Peripheral Vascular Disease  (-) CAD and (-) Past MI (-) dysrhythmias  Rhythm:Regular Rate:Normal - Systolic murmurs    Neuro/Psych PSYCHIATRIC DISORDERS Anxiety Depression  Neuromuscular disease    GI/Hepatic GERD  Poorly Controlled,(+)     (-) substance abuse  ,   Endo/Other  neg diabetesHypothyroidism   Renal/GU negative Renal ROS     Musculoskeletal   Abdominal   Peds  Hematology   Anesthesia Other Findings Past Medical History: 04/16/2010: Adverse effect of malignant hyperthermia No date: Anxiety form of MD: Closed fracture of spine at C1-C4 level with central  cervical cord lesion No  date: DVT (deep venous thrombosis) ([place]) 04/16/2010: 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  No date: Malignant hyperthermia No date: Toxic multinodular goiter  Reproductive/Obstetrics                             Anesthesia Physical  Anesthesia Plan  ASA: 3  Anesthesia Plan: General   Post-op Pain Management:    Induction: Intravenous and Rapid sequence  PONV Risk Score and Plan: 3 and Ondansetron, Dexamethasone and Midazolam  Airway Management Planned: Oral ETT  Additional Equipment: None  Intra-op Plan:   Post-operative Plan: Extubation in OR  Informed Consent: I have reviewed the patients History and Physical, chart, labs and discussed the procedure including the risks, benefits and alternatives for the proposed anesthesia with the patient or authorized representative who has indicated his/her understanding and acceptance.     Dental advisory given  Plan Discussed with: CRNA and Surgeon  Anesthesia Plan Comments: (Patient vomited this morning and has persistent belching and GERD symptoms. Advised patient that spinal anesthetic with sedation would be inappropriate management given risk for aspiration. Patient declines spinal without any sedation. We will proceed with GETA. Discussed in detail risks of anesthesia with patient, including PONV, sore throat, lip/dental damage. Rare risks discussed as well, such as cardiorespiratory and neurological sequelae, MH, and allergic reactions. Patient extremely emotional and tearful but seems to understand. Patient counseled on benefits of smoking cessation, and  increased perioperative risks associated with continued smoking. )       Anesthesia Quick Evaluation

## 2021-04-23 NOTE — Op Note (Signed)
04/23/2021  6:29 PM  PATIENT:  Tara Scott  60 y.o. female  PRE-OPERATIVE DIAGNOSIS:  Right hip fracture displaced femoral neck with osteoarthritis  POST-OPERATIVE DIAGNOSIS:  Right hip fracture displaced femoral neck with osteoarthritis  PROCEDURE:  Procedure(s): TOTAL HIP ARTHROPLASTY ANTERIOR APPROACH (Right)  SURGEON: Laurene Footman, MD  ASSISTANTS: None  ANESTHESIA:   general  EBL:  Total I/O In: 1200 [I.V.:1000; IV Piggyback:200] Out: 50 [Blood:50]  BLOOD ADMINISTERED:none  DRAINS:  Incisional wound VAC    LOCAL MEDICATIONS USED: Marcaine and Exparel  SPECIMEN: Right femoral head  DISPOSITION OF SPECIMEN:  PATHOLOGY  COUNTS:  YES  TOURNIQUET:  * No tourniquets in log *  IMPLANTS: Medacta cemented AMIS 0 stem with 48 mm Mpact DM cup and liner with metal S 28 mm head  DICTATION: .Dragon Dictation   The patient was brought to the operating room and after spinal anesthesia was obtained patient was placed on the operative table with the ipsilateral foot into the Medacta attachment, contralateral leg on a well-padded table. C-arm was brought in and preop template x-ray taken. After prepping and draping in usual sterile fashion appropriate patient identification and timeout procedures were completed. Anterior approach to the hip was obtained and centered over the greater trochanter and TFL muscle. The subcutaneous tissue was incised hemostasis being achieved by electrocautery. TFL fascia was incised and the muscle retracted laterally deep retractor placed. The lateral femoral circumflex vessels were identified and ligated. The anterior capsule was exposed and a capsulotomy performed.  Fracture site was identified, the neck was identified and a femoral neck cut carried out with a saw. The head was removed without difficulty and showed mild degenerative changes to the femoral head and in worse to the acetabulum. Reaming was carried out to 48 mm and a 48 mm cup trial gave  appropriate tightness to the acetabular component a 48 DM cup was impacted into position. The leg was then externally rotated and ischiofemoral and pubofemoral releases carried out. The femur was sequentially broached to a size 1, size 1 stem as head trials were placed and the final components chosen.  Canal was prepared with a cement restrictor placed thorough irrigation of the canal drying of the canal and then insertion of cement with pressurization followed by placement of the 0 stem with the appropriate anteversion.  When the cement was set and excess cement removed the bipolar head was attached and hip reduced. The hip was reduced and was stable the wound was thoroughly irrigated with fibrillar placed along the posterior capsule and medial neck. The deep fascia ws closed using a heavy Quill after infiltration of 30 cc of quarter percent Sensorcaine with epinephrine with Exparel diluted throughout the case .3-0 V-loc to close the skin with skin staples. Xeroform 4 x 4's ABDs and tape patient was sent to recovery in stable condition.   PLAN OF CARE: Admit to inpatient '

## 2021-04-23 NOTE — Significant Event (Signed)
Rapid Response Event Note   Reason for Call :  Per beside RN- patient started vomiting dark coffee ground emesis.  Initial Focused Assessment:  Patient alert and oriented- vitals stable- stated she was starting to throw up dark blood.    Interventions:  Dr. Billie Ruddy at the bedside- MD stated that the emesis did not look like dark blood- patient stated the last thing she ate was "a burger from [employer] last night."  MD stated that the emesis looked like burger meat and vomiting was most likely from patient taking pain medicine last night .  Plan of Care:  For bedside RN to continue to monitor.     Event Summary:   MD Notified: Dr. Billie Ruddy Call Time:07:42 Arrival Time:07:43 End Time:07:47  Penne Lash, RN

## 2021-04-23 NOTE — Care Management Important Message (Signed)
Important Message  Patient Details  Name: [name] MRN: [id number] Date of Birth: 06/02/1961   Medicare Important Message Given:  N/A - LOS <3 / Initial given by admissions  Initial Medicare IM reviewed with Norton Pastel, son, at [phone] by Thornton Dales, Patient Access Associate on 04/23/2021 at 12:19pm.    Dannette Barbara 04/23/2021, 7:19 PM

## 2021-04-23 NOTE — Progress Notes (Addendum)
Pt expressed to feel nauseous. Pt vomited  Brown coffee emesis like remnant. Pt stated to have history of constipation. However, had BM 2 days ago. Contacted Rapid Response  team and attending MD.  Attending MD assessed pt. Per MD, pt vomited undigested burger meat/food  from the previous day.

## 2021-04-23 NOTE — Progress Notes (Signed)
PROGRESS NOTE    Tara Scott  [id number] DOB: 10-12-1960 DOA: 04/22/2021 PCP: Virginia Crews, MD  [id number]   Assessment & Plan:   Principal Problem:   Closed right hip fracture [facility]) Active Problems:   Anxiety   Fall at home, initial encounter   Tobacco abuse   Acquired hypothyroidism   Right hip pain    Tara Scott is a 60 y.o. female with medical history significant for acquired hypothyroidism, chronic tobacco abuse, generalized anxiety disorder, who is admitted to [facility] on 04/22/2021 with acute right femoral neck fracture after presenting from home to [facility] ED complaining of acute right hip pain following ground-level fall.    #) Acute right femoral neck fracture:  --TOTAL HIP ARTHROPLASTY this afternoon --pain control --PT       #) Generalized anxiety disorder: --cont home Celexa    #) Acquired hypothyroidism:  --cont Synthroid    #) Chronic tobacco abuse:  smoked 0.5 packs/day over the last 40 years. --smoking cessation advised  # N/V --likely from opioids --supportive care    DVT prophylaxis: Lovenox SQ Code Status: Full code  Family Communication:  Level of care: Med-Surg Dispo:   The patient is from: home Anticipated d/c is to: SNF Anticipated d/c date is: 2-3 days Patient currently is not medically ready to d/c due to: just post-op   Subjective and Interval History:  Pt having N/V this morning.   Called to the bedside for concern of coffee-ground emesis, which appeared to be partially digested hamberger meat.  Hgb wnl.  Pt went for TOTAL HIP ARTHROPLASTY in the afternoon.   Objective: Vitals:   04/23/21 1900 04/23/21 1915 04/23/21 1935 04/23/21 2302  BP: (!) 112/58 106/64 (!) 105/58 121/68  Pulse: (!) 58  60 69  Resp: '10  17 17  '$ Temp:  (!) 96.6 F (35.9 C) 98 F (36.7 C) 97.8 F (36.6 C)  TempSrc:      SpO2: 99%  100% 100%  Weight:      Height:        Intake/Output Summary (Last 24 hours) at  04/23/2021 2320 Last data filed at 04/23/2021 1810 Gross per 24 hour  Intake 1200 ml  Output 50 ml  Net 1150 ml   Filed Weights   04/22/21 1808 04/23/21 0500  Weight: 63.5 kg 67.1 kg    Examination:   Constitutional: NAD, AAOx3 HEENT: conjunctivae and lids normal, EOMI CV: No cyanosis.   RESP: normal respiratory effort, on RA Extremities: No effusions, edema in BLE SKIN: warm, dry Neuro: II - XII grossly intact.     Data Reviewed: I have personally reviewed following labs and imaging studies  CBC: Recent Labs  Lab 04/22/21 1808 04/23/21 0419  WBC 9.0 11.3*  NEUTROABS 6.0 10.2*  HGB 13.0 13.4  HCT 37.2 40.6  MCV 96.6 98.5  PLT 265 [id number]   Basic Metabolic Panel: Recent Labs  Lab 04/22/21 1808 04/23/21 0419  NA 141 139  K 4.0 4.5  CL 103 102  CO2 29 31  GLUCOSE 101* 162*  BUN 20 20  CREATININE 0.49 0.41*  CALCIUM 9.2 8.9  MG 1.9 2.2   GFR: Estimated Creatinine Clearance: 67.2 mL/min (A) (by C-G formula based on SCr of 0.41 mg/dL (L)). Liver Function Tests: Recent Labs  Lab 04/22/21 1808 04/23/21 0419  AST 27 41  ALT 21 27  ALKPHOS 59 66  BILITOT 0.6 0.5  PROT 7.1 7.8  ALBUMIN 4.0 4.1   No  results for input(s): LIPASE, AMYLASE in the last 168 hours. No results for input(s): AMMONIA in the last 168 hours. Coagulation Profile: Recent Labs  Lab 04/22/21 1808  INR 1.0   Cardiac Enzymes: No results for input(s): CKTOTAL, CKMB, CKMBINDEX, TROPONINI in the last 168 hours. BNP (last 3 results) No results for input(s): PROBNP in the last 8760 hours. HbA1C: No results for input(s): HGBA1C in the last 72 hours. CBG: Recent Labs  Lab 04/23/21 0751  GLUCAP 148*   Lipid Profile: No results for input(s): CHOL, HDL, LDLCALC, TRIG, CHOLHDL, LDLDIRECT in the last 72 hours. Thyroid Function Tests: Recent Labs    04/23/21 0419  TSH 4.168   Anemia Panel: No results for input(s): VITAMINB12, FOLATE, FERRITIN, TIBC, IRON, RETICCTPCT in the last 72  hours. Sepsis Labs: No results for input(s): PROCALCITON, LATICACIDVEN in the last 168 hours.  Recent Results (from the past 240 hour(s))  Resp Panel by RT-PCR (Flu A&B, Covid) Nasopharyngeal Swab     Status: None   Collection Time: 04/22/21  7:48 PM   Specimen: Nasopharyngeal Swab; Nasopharyngeal(NP) swabs in vial transport medium  Result Value Ref Range Status   SARS Coronavirus 2 by RT PCR NEGATIVE NEGATIVE Final    Comment: (NOTE) SARS-CoV-2 target nucleic acids are NOT DETECTED.  The SARS-CoV-2 RNA is generally detectable in upper respiratory specimens during the acute phase of infection. The lowest concentration of SARS-CoV-2 viral copies this assay can detect is 138 copies/mL. A negative result does not preclude SARS-Cov-2 infection and should not be used as the sole basis for treatment or other patient management decisions. A negative result may occur with  improper specimen collection/handling, submission of specimen other than nasopharyngeal swab, presence of viral mutation(s) within the areas targeted by this assay, and inadequate number of viral copies(<138 copies/mL). A negative result must be combined with clinical observations, patient history, and epidemiological information. The expected result is Negative.  Fact Sheet for Patients:  EntrepreneurPulse.com.au  Fact Sheet for Healthcare Providers:  IncredibleEmployment.be  This test is no t yet approved or cleared by the [place] FDA and  has been authorized for detection and/or diagnosis of SARS-CoV-2 by FDA under an Emergency Use Authorization (EUA). This EUA will remain  in effect (meaning this test can be used) for the duration of the COVID-19 declaration under Section 564(b)(1) of the Act, 21 U.S.C.section 360bbb-3(b)(1), unless the authorization is terminated  or revoked sooner.       Influenza A by PCR NEGATIVE NEGATIVE Final   Influenza B by PCR NEGATIVE  NEGATIVE Final    Comment: (NOTE) The Xpert Xpress SARS-CoV-2/FLU/RSV plus assay is intended as an aid in the diagnosis of influenza from Nasopharyngeal swab specimens and should not be used as a sole basis for treatment. Nasal washings and aspirates are unacceptable for Xpert Xpress SARS-CoV-2/FLU/RSV testing.  Fact Sheet for Patients: EntrepreneurPulse.com.au  Fact Sheet for Healthcare Providers: IncredibleEmployment.be  This test is not yet approved or cleared by the [place] FDA and has been authorized for detection and/or diagnosis of SARS-CoV-2 by FDA under an Emergency Use Authorization (EUA). This EUA will remain in effect (meaning this test can be used) for the duration of the COVID-19 declaration under Section 564(b)(1) of the Act, 21 U.S.C. section 360bbb-3(b)(1), unless the authorization is terminated or revoked.  Performed at [facility], [address], [place], [place] [address]   Surgical pcr screen     Status: None   Collection Time: 04/23/21 10:27 AM  Specimen: Nasal Mucosa; Nasal Swab  Result Value Ref Range Status   MRSA, PCR NEGATIVE NEGATIVE Final   Staphylococcus aureus NEGATIVE NEGATIVE Final    Comment: (NOTE) The Xpert SA Assay (FDA approved for NASAL specimens in patients 29 years of age and older), is one component of a comprehensive surveillance program. It is not intended to diagnose infection nor to guide or monitor treatment. Performed at [facility], [address], [place], [place] [address]       Radiology Studies: DG Knee 2 Views Right  Result Date: 04/22/2021 CLINICAL DATA:  Hip fracture EXAM: RIGHT KNEE - 1-2 VIEW COMPARISON:  None. FINDINGS: No evidence of fracture, dislocation, or joint effusion. No evidence of arthropathy or other focal bone abnormality. Soft tissues are unremarkable. IMPRESSION: Negative. Electronically Signed   By: Donavan Foil M.D.   On: 04/22/2021  20:39   DG Chest Port 1 View  Result Date: 04/22/2021 CLINICAL DATA:  Fall.  Right hip fracture EXAM: PORTABLE CHEST 1 VIEW COMPARISON:  10/20/2012 FINDINGS: The heart size and mediastinal contours are within normal limits. Both lungs are clear. The visualized skeletal structures are unremarkable. IMPRESSION: No active disease. Electronically Signed   By: Franchot Gallo M.D.   On: 04/22/2021 20:02   DG HIP OPERATIVE UNILAT W OR W/O PELVIS RIGHT  Result Date: 04/23/2021 CLINICAL DATA:  Right hip arthroplasty EXAM: OPERATIVE RIGHT HIP (WITH PELVIS IF PERFORMED) 2 VIEWS TECHNIQUE: Fluoroscopic spot image(s) were submitted for interpretation post-operatively. COMPARISON:  None. FINDINGS: There are 2 fluoroscopic clips obtained before and after right total arthroplasty. IMPRESSION: Intraoperative fluoroscopy Electronically Signed   By: Ulyses Jarred M.D.   On: 04/23/2021 19:11   DG HIP UNILAT W OR W/O PELVIS 2-3 VIEWS RIGHT  Result Date: 04/23/2021 CLINICAL DATA:  Total hip arthroplasty EXAM: DG HIP (WITH OR WITHOUT PELVIS) 2-3V RIGHT COMPARISON:  04/22/2021 FINDINGS: Status post right total hip arthroplasty with expected postsurgical findings including drain and skin staples. No periprosthetic abnormality. IMPRESSION: Expected postsurgical findings of right total hip arthroplasty. Electronically Signed   By: Ulyses Jarred M.D.   On: 04/23/2021 19:11   DG Hip Unilat W or Wo Pelvis 2-3 Views Right  Result Date: 04/22/2021 CLINICAL DATA:  Fall. EXAM: DG HIP (WITH OR WITHOUT PELVIS) 2-3V RIGHT COMPARISON:  11/29/2019 FINDINGS: Fracture right femoral neck with mild impaction angulation. Right hip joint space normal. No other fracture. IMPRESSION: Angulated right femoral neck fracture. Electronically Signed   By: Franchot Gallo M.D.   On: 04/22/2021 20:02   DG Femur Min 2 Views Right  Result Date: 04/22/2021 CLINICAL DATA:  Hip fracture EXAM: RIGHT FEMUR 2 VIEWS COMPARISON:  Hip radiograph 04/22/2021 FINDINGS:  Acute mildly displaced right femoral neck fracture. No femoral head dislocation. IMPRESSION: Acute right femoral neck fracture Electronically Signed   By: Donavan Foil M.D.   On: 04/22/2021 20:39     Scheduled Meds:  acetaminophen  1,000 mg Oral Q6H   [START ON 04/24/2021] citalopram  40 mg Oral Daily   docusate sodium  100 mg Oral BID   [START ON 04/24/2021] enoxaparin (LOVENOX) injection  40 mg Subcutaneous Q24H   [START ON 04/24/2021] levothyroxine  100 mcg Oral Q0600   pantoprazole  40 mg Oral Daily   Continuous Infusions:  sodium chloride 100 mL/hr at 04/23/21 1957    ceFAZolin (ANCEF) IV 2 g (04/23/21 2222)   methocarbamol (ROBAXIN) IV       LOS: 1 day     Otila Kluver  Billie Ruddy, MD [facility] If 7PM-7AM, please contact night-coverage 04/23/2021, 11:20 PM

## 2021-04-23 NOTE — Transfer of Care (Signed)
Immediate Anesthesia Transfer of Care Note  Patient: Tara Scott  Procedure(s) Performed: TOTAL HIP ARTHROPLASTY ANTERIOR APPROACH (Right: Hip)  Patient Location: PACU  Anesthesia Type:General  Level of Consciousness: awake  Airway & Oxygen Therapy: Patient Spontanous Breathing and Patient connected to face mask oxygen  Post-op Assessment: Report given to RN  Post vital signs: stable  Last Vitals:  Vitals Value Taken Time  BP 137/62 04/23/21 1815  Temp    Pulse 62 04/23/21 1817  Resp 18 04/23/21 1817  SpO2 100 % 04/23/21 1817  Vitals shown include unvalidated device data.  Last Pain:  Vitals:   04/23/21 1544  TempSrc: Temporal  PainSc: 8       Patients Stated Pain Goal: 5 ([id number])  Complications: No notable events documented.

## 2021-04-24 ENCOUNTER — Encounter: Payer: Self-pay | Admitting: Orthopedic Surgery

## 2021-04-24 LAB — CBC
HCT: 32.1 % — ABNORMAL LOW (ref 36.0–46.0)
Hemoglobin: 10.7 g/dL — ABNORMAL LOW (ref 12.0–15.0)
MCH: 33 pg (ref 26.0–34.0)
MCHC: 33.3 g/dL (ref 30.0–36.0)
MCV: 99.1 fL (ref 80.0–100.0)
Platelets: 170 10*3/uL (ref 150–400)
RBC: 3.24 MIL/uL — ABNORMAL LOW (ref 3.87–5.11)
RDW: 12.8 % (ref 11.5–15.5)
WBC: 11.2 10*3/uL — ABNORMAL HIGH (ref 4.0–10.5)
nRBC: 0 % (ref 0.0–0.2)

## 2021-04-24 LAB — BASIC METABOLIC PANEL
Anion gap: 4 — ABNORMAL LOW (ref 5–15)
BUN: 14 mg/dL (ref 6–20)
CO2: 28 mmol/L (ref 22–32)
Calcium: 8.3 mg/dL — ABNORMAL LOW (ref 8.9–10.3)
Chloride: 105 mmol/L (ref 98–111)
Creatinine, Ser: <0.3 mg/dL — ABNORMAL LOW (ref 0.44–1.00)
Glucose, Bld: 133 mg/dL — ABNORMAL HIGH (ref 70–99)
Potassium: 3.8 mmol/L (ref 3.5–5.1)
Sodium: 137 mmol/L (ref 135–145)

## 2021-04-24 LAB — MAGNESIUM: Magnesium: 2 mg/dL (ref 1.7–2.4)

## 2021-04-24 MED ORDER — POLYETHYLENE GLYCOL 3350 17 G PO PACK
17.0000 g | PACK | Freq: Two times a day (BID) | ORAL | Status: DC
  Administered 2021-04-24 – 2021-04-26 (×5): 17 g via ORAL
  Filled 2021-04-24 (×5): qty 1

## 2021-04-24 NOTE — Evaluation (Signed)
Occupational Therapy Evaluation Patient Details Name: Tara Scott MRN: [id number] DOB: 06-Dec-1960 Today's Date: 04/24/2021    History of Present Illness Tara Scott is a 60 y.o. female with medical history significant for acquired hypothyroidism, chronic tobacco abuse, generalized anxiety disorder, who is admitted to [facility] on 04/22/2021 with acute right femoral neck fracture after presenting from home to [facility] ED complaining of acute right hip pain following ground-level fall. History of closed fracture of spine at C1-C4 level with central cervical cord lesion form of MD.   Clinical Impression   Patient presenting with decreased I in self care, balance, functional mobility/transfer, endurance, and safety awareness. Patient reports living at home alone PTA. She does report having "her own way of moving and doing things" that work for her at baseline. OT is very anxious about movement and tearful when discussing home set up secondary to her twin sister and husband being deceased at this time. She lives in an apartment that she is able to manage on her own but has concerns of being able to do so as she continues to age. Patient currently functioning at min - mod A . Pt needing mod lifting assistance to stand from recliner chair and does fatigue quickly. Patient will benefit from acute OT to increase overall independence in the areas of ADLs, functional mobility, and safety awareness in order to safely discharge to next venue of care.     Follow Up Recommendations  SNF;Supervision/Assistance - 24 hour    Equipment Recommendations  Other (comment) (defer to next venue of care)       Precautions / Restrictions Precautions Precautions: Fall;Anterior Hip Precaution Booklet Issued: No Precaution Comments: direct anterior hip Restrictions Weight Bearing Restrictions: Yes RLE Weight Bearing: Weight bearing as tolerated      Mobility Bed Mobility Overal bed mobility:  Needs Assistance Bed Mobility: Supine to Sit     Supine to sit: Mod assist;HOB elevated     General bed mobility comments: seated in recliner chair    Transfers Overall transfer level: Needs assistance Equipment used: Rolling walker (2 wheeled) Transfers: Sit to/from Stand Sit to Stand: Mod assist         General transfer comment: mod lifting assistance from recliner chair    Balance Overall balance assessment: Needs assistance Sitting-balance support: Feet supported Sitting balance-Leahy Scale: Good Sitting balance - Comments: able to maintain sitting balance with supervision   Standing balance support: Bilateral upper extremity supported;During functional activity Standing balance-Leahy Scale: Fair Standing balance comment: reliant on B UE support due to R LE pain, B LE weakness                           ADL either performed or assessed with clinical judgement   ADL Overall ADL's : Needs assistance/impaired     Grooming: Wash/dry hands;Wash/dry face;Oral care;Sitting;Set up                   Toilet Transfer: Moderate assistance   Toileting- Clothing Manipulation and Hygiene: Maximal assistance       Functional mobility during ADLs: Moderate assistance;Rolling walker       Vision Patient Visual Report: No change from baseline              Pertinent Vitals/Pain Pain Assessment: Faces Pain Score: 3  Faces Pain Scale: Hurts little more Pain Location: R hip/thigh Pain Descriptors / Indicators: Discomfort;Sore;Grimacing;Guarding Pain Intervention(s): Limited activity within patient's tolerance;Monitored  during session;Repositioned;Premedicated before session     Hand Dominance Right   Extremity/Trunk Assessment Upper Extremity Assessment Upper Extremity Assessment: Generalized weakness   Lower Extremity Assessment Lower Extremity Assessment: Generalized weakness;Defer to PT evaluation RLE: Unable to fully assess due to pain RLE  Coordination: decreased gross motor   Cervical / Trunk Assessment Cervical / Trunk Assessment: Normal   Communication Communication Communication: No difficulties   Cognition Arousal/Alertness: Awake/alert Behavior During Therapy: Anxious Overall Cognitive Status: Within Functional Limits for tasks assessed                                 General Comments: Patient with general anxiety regarding movement   General Comments       Exercises Total Joint Exercises Ankle Circles/Pumps: AROM;Both;10 reps        Home Living Family/patient expects to be discharged to:: Private residence Living Arrangements: Alone Available Help at Discharge: Friend(s);Available PRN/intermittently Type of Home: Apartment Home Access: Level entry     Home Layout: One level     Bathroom Shower/Tub: Tub/shower unit                    Prior Functioning/Environment Level of Independence: Independent                 OT Problem List: Decreased strength;Pain;Impaired sensation;Decreased range of motion;Decreased activity tolerance;Decreased safety awareness;Impaired balance (sitting and/or standing);Impaired vision/perception;Decreased knowledge of use of DME or AE      OT Treatment/Interventions: Self-care/ADL training;Modalities;Balance training;Therapeutic exercise;Therapeutic activities;Energy conservation;DME and/or AE instruction;Manual therapy;Patient/family education    OT Goals(Current goals can be found in the care plan section) Acute Rehab OT Goals Patient Stated Goal: to improve mobility OT Goal Formulation: With patient Time For Goal Achievement: 04/24/21 Potential to Achieve Goals: Good ADL Goals Pt Will Perform Grooming: (P) with supervision;standing Pt Will Perform Lower Body Dressing: (P) with min guard assist;sit to/from stand Pt Will Transfer to Toilet: (P) with min guard assist;ambulating Pt Will Perform Toileting - Clothing Manipulation and hygiene:  (P) with min guard assist;sit to/from stand  OT Frequency: Min 2X/week   Barriers to D/C: Decreased caregiver support  lives alone          AM-PAC OT "6 Clicks" Daily Activity     Outcome Measure Help from another person eating meals?: None Help from another person taking care of personal grooming?: A Little Help from another person toileting, which includes using toliet, bedpan, or urinal?: A Lot Help from another person bathing (including washing, rinsing, drying)?: A Lot Help from another person to put on and taking off regular upper body clothing?: A Little Help from another person to put on and taking off regular lower body clothing?: A Lot 6 Click Score: 16   End of Session Equipment Utilized During Treatment: Rolling walker Nurse Communication: Mobility status  Activity Tolerance: Patient tolerated treatment well Patient left: in chair;with call bell/phone within reach;with chair alarm set  OT Visit Diagnosis: Unsteadiness on feet (R26.81);Muscle weakness (generalized) (M62.81);History of falling (Z91.81)                Time: [id number] OT Time Calculation (min): 33 min Charges:  OT General Charges $OT Visit: 1 Visit OT Evaluation $OT Eval Moderate Complexity: 1 Mod OT Treatments $Self Care/Home Management : 8-22 mins $Therapeutic Activity: 8-22 mins  Darleen Crocker, MS, OTR/L , CBIS ascom [phone]  04/24/21, 1:22 PM

## 2021-04-24 NOTE — NC FL2 (Signed)
[place] LEVEL OF CARE SCREENING TOOL     IDENTIFICATION  Patient Name: Tara Scott Birthdate: [birth year] Sex: female Admission Date (Current Location): 04/22/2021  [facility] and [place] Number:  Engineering geologist and Address:  [facility], [address] [place], [place] [address]      Provider Number: [id number]  Attending Physician Name and Address:  Enzo Bi, MD  Relative Name and Phone Number:  Legrand Como SOn, [phone]    Current Level of Care: Hospital Recommended Level of Care: [facility] [place] Prior Approval Number:    Date Approved/Denied:   PASRR Number: [id number]  Discharge Plan: SNF    Current Diagnoses: Patient Active Problem List   Diagnosis Date Noted   Fall at home, initial encounter 04/23/2021   Tobacco abuse 04/23/2021   Acquired hypothyroidism 04/23/2021   Right hip pain 04/23/2021   Closed right hip fracture ([place]) 04/22/2021   Esophageal dysphagia 11/03/2020   Postablative hypothyroidism 09/04/2019   Tobacco use disorder 09/04/2019   Benign essential tremor 12/26/2015   Anxiety 03/14/2015   Central core disease ([place]) 03/14/2015   Dizziness 03/14/2015   MD (muscular dystrophy) ([place]) 03/14/2015   Avitaminosis D 04/16/2010   Constipation 02/17/2010   Goiter, nontoxic, multinodular 11/04/2009   Absence of menstruation 08/18/2009   Acid reflux 12/16/2008   Cannot sleep 11/17/2008   Adnexal pain 10/04/2007   MDD (major depressive disorder) 09/01/2007    Orientation RESPIRATION BLADDER Height & Weight     Self, Time, Situation, Place  Normal Continent, External catheter Weight: 67.1 kg Height:  '5\' 2"'$  (157.5 cm)  BEHAVIORAL SYMPTOMS/MOOD NEUROLOGICAL BOWEL NUTRITION STATUS      Continent Diet  AMBULATORY STATUS COMMUNICATION OF NEEDS Skin   Extensive Assist Verbally Surgical wounds, Normal                       Personal Care Assistance Level of Assistance  Bathing, Dressing  Bathing Assistance: Limited assistance   Dressing Assistance: Limited assistance     Functional Limitations Info             SPECIAL CARE FACTORS FREQUENCY  PT (By licensed PT), OT (By licensed OT)     PT Frequency: 5 times per week OT Frequency: 5 times per week            Contractures Contractures Info: Not present    Additional Factors Info  Code Status, Allergies Code Status Info: full code Allergies Info: Anesthetics, Halogenated, Cipro, Methimazole, Oxycodone, Aceteminophen, penicillin           Current Medications (04/24/2021):  This is the current hospital active medication list Current Facility-Administered Medications  Medication Dose Route Frequency Provider Last Rate Last Admin   0.9 %  sodium chloride infusion   Intravenous Continuous Hessie Knows, MD 100 mL/hr at 04/23/21 1957 New Bag at 04/23/21 1957   acetaminophen (TYLENOL) tablet 1,000 mg  1,000 mg Oral Q6H Hessie Knows, MD   1,000 mg at 04/24/21 0930   acetaminophen (TYLENOL) tablet 325-650 mg  325-650 mg Oral Q6H PRN Hessie Knows, MD   650 mg at 04/24/21 0612   alum & mag hydroxide-simeth (MAALOX/MYLANTA) 200-200-20 MG/5ML suspension 30 mL  30 mL Oral Q4H PRN Hessie Knows, MD       bisacodyl (DULCOLAX) suppository 10 mg  10 mg Rectal Daily PRN Hessie Knows, MD       citalopram (CELEXA) tablet 40 mg  40 mg Oral Daily Hessie Knows,  MD   40 mg at 04/24/21 0931   diphenhydrAMINE (BENADRYL) 12.5 MG/5ML elixir 12.5-25 mg  12.5-25 mg Oral Q4H PRN Hessie Knows, MD       docusate sodium (COLACE) capsule 100 mg  100 mg Oral BID Hessie Knows, MD   100 mg at 04/24/21 0931   enoxaparin (LOVENOX) injection 40 mg  40 mg Subcutaneous Q24H Hessie Knows, MD   40 mg at 04/24/21 0932   ketorolac (TORADOL) 30 MG/ML injection 15 mg  15 mg Intravenous Q6H PRN Hessie Knows, MD   15 mg at 04/23/21 1128   levothyroxine (SYNTHROID) tablet 100 mcg  100 mcg Oral Q0600 Hessie Knows, MD   100 mcg at 04/24/21 [id number]    magnesium hydroxide (MILK OF MAGNESIA) suspension 30 mL  30 mL Oral Daily PRN Hessie Knows, MD       menthol-cetylpyridinium (CEPACOL) lozenge 3 mg  1 lozenge Oral PRN Hessie Knows, MD       Or   phenol (CHLORASEPTIC) mouth spray 1 spray  1 spray Mouth/Throat PRN Hessie Knows, MD       methocarbamol (ROBAXIN) tablet 500 mg  500 mg Oral Q6H PRN Hessie Knows, MD       Or   methocarbamol (ROBAXIN) 500 mg in dextrose 5 % 50 mL IVPB  500 mg Intravenous Q6H PRN Hessie Knows, MD       naloxone [facility]) injection 0.4 mg  0.4 mg Intravenous PRN Hessie Knows, MD       ondansetron [facility]) tablet 4 mg  4 mg Oral Q6H PRN Hessie Knows, MD   4 mg at 04/24/21 0931   Or   ondansetron (ZOFRAN) injection 4 mg  4 mg Intravenous Q6H PRN Hessie Knows, MD       oxyCODONE (Oxy IR/ROXICODONE) immediate release tablet 5-10 mg  5-10 mg Oral Q4H PRN Hessie Knows, MD   10 mg at 04/24/21 1433   pantoprazole (PROTONIX) EC tablet 40 mg  40 mg Oral Daily Hessie Knows, MD   40 mg at 04/24/21 0931   polyethylene glycol (MIRALAX / GLYCOLAX) packet 17 g  17 g Oral BID Enzo Bi, MD       zolpidem Lorrin Mais) tablet 5 mg  5 mg Oral QHS PRN Hessie Knows, MD         Discharge Medications: Please see discharge summary for a list of discharge medications.  Relevant Imaging Results:  Relevant Lab Results:   Additional Information SS# [id number]  Su Hilt, RN

## 2021-04-24 NOTE — TOC Progression Note (Signed)
Transition of Care [facility]) - Progression Note    Patient Details  Name: Tara Scott MRN: [id number] Date of Birth: [birth year]  Transition of Care [facility]) CM/SW [place], RN Phone Number: 04/24/2021, 4:18 PM  Clinical Narrative:     Reached out to the facilities and requested them to please look at the patient for a bed offer, once have offers will review with the patient       Expected Discharge Plan and Services                                                 Social Determinants of Health (SDOH) Interventions    Readmission Risk Interventions No flowsheet data found.

## 2021-04-24 NOTE — Evaluation (Addendum)
Physical Therapy Evaluation Patient Details Name: Tara Scott MRN: [id number] DOB: [birth year] Today's Date: 04/24/2021   History of Present Illness  Tara Scott is a 60 y.o. female with medical history significant for acquired hypothyroidism, chronic tobacco abuse, generalized anxiety disorder, who is admitted to [facility] on 04/22/2021 with acute right femoral neck fracture after presenting from home to [facility] ED complaining of acute right hip pain following ground-level fall. History of closed fracture of spine at C1-C4 level with central cervical cord lesion form of MD.   Clinical Impression  Patient received in bed, anxious about moving. Patient required mod/max assist for supine to sit, mod assist for sit to stand and min guard to take steps to recliner. Increased time needed with mobility due to anxiety/fear. Patient will continue to benefit from skilled PT while here to improve safety, and independence with functional mobility.       Follow Up Recommendations SNF    Equipment Recommendations  Rolling walker with 5" wheels    Recommendations for Other Services       Precautions / Restrictions Precautions Precautions: Fall;Anterior Hip Precaution Booklet Issued: No Restrictions Weight Bearing Restrictions: Yes RLE Weight Bearing: Weight bearing as tolerated      Mobility  Bed Mobility Overal bed mobility: Needs Assistance Bed Mobility: Supine to Sit     Supine to sit: Mod assist;HOB elevated     General bed mobility comments: Required assist to bring B LEs off bed, assist to scoot to edge of bed and raise trunk to seated position.  Required increased time sitting due to reported dizziness.    Transfers Overall transfer level: Needs assistance Equipment used: Rolling walker (2 wheeled) Transfers: Sit to/from Stand Sit to Stand: Min assist;From elevated surface         General transfer comment: Cues for hand  placement/safety  Ambulation/Gait Ambulation/Gait assistance: Min guard Gait Distance (Feet): 4 Feet Assistive device: Rolling walker (2 wheeled) Gait Pattern/deviations: Step-to pattern;Decreased step length - right;Decreased step length - left Gait velocity: decr   General Gait Details: patient able to take a few steps from bed to recliner with min guard. Cues for sequencing and safety needed  Stairs            Wheelchair Mobility    Modified Rankin (Stroke Patients Only)       Balance Overall balance assessment: Needs assistance Sitting-balance support: Feet supported Sitting balance-Leahy Scale: Good Sitting balance - Comments: able to maintain sitting balance with supervision   Standing balance support: Bilateral upper extremity supported;During functional activity Standing balance-Leahy Scale: Fair Standing balance comment: reliant on B UE support due to R LE pain, B LE weakness                             Pertinent Vitals/Pain Pain Assessment: 0-10 Pain Score: 3  Pain Location: R hip/thigh Pain Descriptors / Indicators: Discomfort;Sore;Grimacing;Guarding Pain Intervention(s): Limited activity within patient's tolerance;Premedicated before session;Monitored during session;Repositioned    Home Living Family/patient expects to be discharged to:: Private residence Living Arrangements: Alone Available Help at Discharge: Friend(s);Available PRN/intermittently Type of Home: Apartment Home Access: Level entry     Home Layout: One level        Prior Function Level of Independence: Independent               Hand Dominance        Extremity/Trunk Assessment   Upper Extremity Assessment Upper  Extremity Assessment: Generalized weakness    Lower Extremity Assessment Lower Extremity Assessment: Generalized weakness;RLE deficits/detail RLE: Unable to fully assess due to pain RLE Coordination: decreased gross motor    Cervical / Trunk  Assessment Cervical / Trunk Assessment: Normal  Communication   Communication: No difficulties  Cognition Arousal/Alertness: Awake/alert Behavior During Therapy: Anxious Overall Cognitive Status: Within Functional Limits for tasks assessed                                 General Comments: Patient with general anxiety regarding movement      General Comments      Exercises Total Joint Exercises Ankle Circles/Pumps: AROM;Both;10 reps   Assessment/Plan    PT Assessment Patient needs continued PT services  PT Problem List Decreased strength;Decreased mobility;Decreased activity tolerance;Decreased balance;Pain;Decreased knowledge of use of DME;Decreased safety awareness       PT Treatment Interventions DME instruction;Therapeutic exercise;Gait training;Balance training;Stair training;Functional mobility training;Therapeutic activities;Patient/family education    PT Goals (Current goals can be found in the Care Plan section)  Acute Rehab PT Goals Patient Stated Goal: to improve mobility PT Goal Formulation: With patient Time For Goal Achievement: 05/08/21 Potential to Achieve Goals: Good    Frequency BID   Barriers to discharge Decreased caregiver support      Co-evaluation               AM-PAC PT "6 Clicks" Mobility  Outcome Measure Help needed turning from your back to your side while in a flat bed without using bedrails?: A Lot Help needed moving from lying on your back to sitting on the side of a flat bed without using bedrails?: A Lot Help needed moving to and from a bed to a chair (including a wheelchair)?: A Little Help needed standing up from a chair using your arms (e.g., wheelchair or bedside chair)?: A Lot Help needed to walk in hospital room?: A Lot Help needed climbing 3-5 steps with a railing? : A Lot 6 Click Score: 13    End of Session Equipment Utilized During Treatment: Gait belt Activity Tolerance: Patient tolerated treatment  well;Patient limited by fatigue;Patient limited by pain Patient left: in chair;with call bell/phone within reach Nurse Communication: Mobility status PT Visit Diagnosis: Unsteadiness on feet (R26.81);Other abnormalities of gait and mobility (R26.89);Muscle weakness (generalized) (M62.81);Difficulty in walking, not elsewhere classified (R26.2);History of falling (Z91.81);Pain Pain - Right/Left: Right Pain - part of body: Leg    Time: [id number] PT Time Calculation (min) (ACUTE ONLY): 35 min   Charges:   PT Evaluation $PT Eval Moderate Complexity: 1 Mod PT Treatments $Gait Training: 8-22 mins        Shiv Shuey, PT, GCS 04/24/21,11:20 AM

## 2021-04-24 NOTE — Plan of Care (Signed)
S/p right hip arthroplasty. No complaints of pain during the night. Scheduled pain meds administered. Experienced some urinary retention during the night. In and out cath. Patient void spontaneously following the in and out cath. NS infusing @ 75 due to patient's poor PO intake. She has not been drinking much.  Problem: Education: Goal: Knowledge of General Education information will improve Description: Including pain rating scale, medication(s)/side effects and non-pharmacologic comfort measures Outcome: Progressing   Problem: Pain Managment: Goal: General experience of comfort will improve Outcome: Progressing   Problem: Safety: Goal: Ability to remain free from injury will improve Outcome: Progressing   Problem: Skin Integrity: Goal: Risk for impaired skin integrity will decrease Outcome: Progressing

## 2021-04-24 NOTE — TOC Progression Note (Addendum)
Transition of Care [facility]) - Progression Note    Patient Details  Name: Tara Scott MRN: [id number] Date of Birth: 06-01-1961  Transition of Care [facility]) CM/SW [place], RN Phone Number: 04/24/2021, 11:03 AM  Clinical Narrative:     Met with the patient and she is agreeable to a bed search to see who has a STR SNF bed, PASSR obtained FL2 completed, Bedsearch sent       Expected Discharge Plan and Services                                                 Social Determinants of Health (SDOH) Interventions    Readmission Risk Interventions No flowsheet data found.

## 2021-04-24 NOTE — Progress Notes (Signed)
Physical Therapy Treatment Patient Details Name: Tara Scott MRN: [id number] DOB: May 01, 1961 Today's Date: 04/24/2021    History of Present Illness AMELAH ZIENTARA is a 60 y.o. female with medical history significant for acquired hypothyroidism, chronic tobacco abuse, generalized anxiety disorder, who is admitted to [facility] on 04/22/2021 with acute right femoral neck fracture after presenting from home to [facility] ED complaining of acute right hip pain following ground-level fall. History of closed fracture of spine at C1-C4 level with central cervical cord lesion form of MD.    PT Comments    Patient received in recliner. Initially requiring mod +2 assist to stand, but when she can have her legs in wide stance she requires min A. Patient is able to ambulate a few feet with RW and min assist. Cues for safety and hand placement. Patient will continue to benefit from skilled PT to improve mobility, independence and safety.     Follow Up Recommendations  SNF     Equipment Recommendations  Rolling walker with 5" wheels    Recommendations for Other Services       Precautions / Restrictions Precautions Precautions: Anterior Hip;Fall Precaution Booklet Issued: No Precaution Comments: direct anterior hip Restrictions Weight Bearing Restrictions: Yes RLE Weight Bearing: Weight bearing as tolerated    Mobility  Bed Mobility Overal bed mobility: Needs Assistance Bed Mobility: Sit to Supine       Sit to supine: Mod assist   General bed mobility comments: seated in recliner chair    Transfers Overall transfer level: Needs assistance Equipment used: Rolling walker (2 wheeled) Transfers: Sit to/from Stand Sit to Stand: Min assist;+2 physical assistance         General transfer comment: min assist when patient has LEs positioned as she needs ( wide due to her MD)  Ambulation/Gait Ambulation/Gait assistance: Min guard Gait Distance (Feet): 4 Feet Assistive  device: Rolling walker (2 wheeled) Gait Pattern/deviations: Step-to pattern;Decreased step length - right;Decreased step length - left Gait velocity: decr   General Gait Details: patient able to take a few steps from bed to recliner with min guard. Cues for sequencing and safety needed. Patient tries to reach for bed prematurely, requiring cues for safety   Stairs             Wheelchair Mobility    Modified Rankin (Stroke Patients Only)       Balance Overall balance assessment: Needs assistance Sitting-balance support: Feet supported Sitting balance-Leahy Scale: Good Sitting balance - Comments: able to maintain sitting balance with supervision   Standing balance support: Bilateral upper extremity supported;During functional activity Standing balance-Leahy Scale: Fair Standing balance comment: reliant on B UE support due to R LE pain, B LE weakness                            Cognition Arousal/Alertness: Awake/alert Behavior During Therapy: Anxious Overall Cognitive Status: Within Functional Limits for tasks assessed                                 General Comments: Patient with general anxiety regarding movement      Exercises Total Joint Exercises Ankle Circles/Pumps: AROM;Both;10 reps Gluteal Sets: AROM;10 reps Heel Slides: AAROM;Right;10 reps Hip ABduction/ADduction: AAROM;Right;10 reps    General Comments        Pertinent Vitals/Pain Pain Assessment: Faces Faces Pain Scale: Hurts little more Pain  Location: R hip/thigh Pain Descriptors / Indicators: Discomfort;Sore;Grimacing;Guarding;Moaning Pain Intervention(s): Monitored during session;Limited activity within patient's tolerance;Repositioned;RN gave pain meds during session    Home Living Family/patient expects to be discharged to:: Private residence Living Arrangements: Alone Available Help at Discharge: Friend(s);Available PRN/intermittently Type of Home: Apartment Home  Access: Level entry   Home Layout: One level        Prior Function Level of Independence: Independent          PT Goals (current goals can now be found in the care plan section) Acute Rehab PT Goals Patient Stated Goal: to improve mobility PT Goal Formulation: With patient Time For Goal Achievement: 05/08/21 Potential to Achieve Goals: Good Progress towards PT goals: Progressing toward goals    Frequency    BID      PT Plan Current plan remains appropriate    Co-evaluation              AM-PAC PT "6 Clicks" Mobility   Outcome Measure  Help needed turning from your back to your side while in a flat bed without using bedrails?: A Lot Help needed moving from lying on your back to sitting on the side of a flat bed without using bedrails?: A Lot Help needed moving to and from a bed to a chair (including a wheelchair)?: A Little Help needed standing up from a chair using your arms (e.g., wheelchair or bedside chair)?: A Lot Help needed to walk in hospital room?: A Lot Help needed climbing 3-5 steps with a railing? : Total 6 Click Score: 12    End of Session Equipment Utilized During Treatment: Gait belt Activity Tolerance: Patient limited by pain;Patient limited by fatigue Patient left: in bed;with call bell/phone within reach;with bed alarm set;with family/visitor present Nurse Communication: Mobility status PT Visit Diagnosis: Unsteadiness on feet (R26.81);Other abnormalities of gait and mobility (R26.89);Muscle weakness (generalized) (M62.81);Difficulty in walking, not elsewhere classified (R26.2);History of falling (Z91.81);Pain Pain - Right/Left: Right Pain - part of body: Leg     Time: 1422-1447 PT Time Calculation (min) (ACUTE ONLY): 25 min  Charges:  $Gait Training: 8-22 mins $Therapeutic Exercise: 8-22 mins                    [employer], PT, GCS 04/24/21,3:01 PM

## 2021-04-24 NOTE — Progress Notes (Signed)
   Subjective: 1 Day Post-Op Procedure(s) (LRB): TOTAL HIP ARTHROPLASTY ANTERIOR APPROACH (Right) Patient reports pain as moderate.   Patient is well, and has had no acute complaints or problems Denies any CP, SOB, ABD pain. We will continue therapy today.  Plan is to go Home after hospital stay.  Objective: Vital signs in last 24 hours: Temp:  [96.6 F (35.9 C)-99.3 F (37.4 C)] 98 F (36.7 C) (08/05 0809) Pulse Rate:  [58-73] 67 (08/05 0809) Resp:  [10-22] 15 (08/05 0809) BP: (105-137)/(58-68) 114/58 (08/05 0809) SpO2:  [91 %-100 %] 95 % (08/05 0809)  Intake/Output from previous day: 08/04 0701 - 08/05 0700 In: 1902.3 [I.V.:1702.3; IV Piggyback:200] Out: 50 [Blood:50] Intake/Output this shift: No intake/output data recorded.  Recent Labs    04/22/21 1808 04/23/21 0419 04/24/21 0508  HGB 13.0 13.4 10.7*   Recent Labs    04/23/21 0419 04/24/21 0508  WBC 11.3* 11.2*  RBC 4.12 3.24*  HCT 40.6 32.1*  PLT 251 170   Recent Labs    04/23/21 0419 04/24/21 0508  NA 139 137  K 4.5 3.8  CL 102 105  CO2 31 28  BUN 20 14  CREATININE 0.41* <0.30*  GLUCOSE 162* 133*  CALCIUM 8.9 8.3*   Recent Labs    04/22/21 1808  INR 1.0    EXAM General - Patient is Alert, Appropriate, and Oriented Extremity - Neurovascular intact Sensation intact distally Intact pulses distally Dorsiflexion/Plantar flexion intact Dressing - dressing C/D/I and no drainage Motor Function - intact, moving foot and toes well on exam.  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    Malignant hyperthermia    Toxic  multinodular goiter     Assessment/Plan:   1 Day Post-Op Procedure(s) (LRB): TOTAL HIP ARTHROPLASTY ANTERIOR APPROACH (Right) Principal Problem:   Closed right hip fracture (HCC) Active Problems:   Anxiety   Fall at home, initial encounter   Tobacco abuse   Acquired hypothyroidism   Right hip pain  Estimated body mass index is 27.06 kg/m as calculated from the following:   Height as of this encounter: '5\' 2"'$  (1.575 m).   Weight as of this encounter: 67.1 kg. Advance diet Up with therapy Work on bowel movement Vital signs are stable Labs are stable Pain well controlled Care management to assist with discharge   DVT Prophylaxis - Lovenox, Foot Pumps, and TED hose Weight-Bearing as tolerated to right leg   T. Rachelle Hora, PA-C [place] 04/24/2021, 8:14 AM

## 2021-04-24 NOTE — Progress Notes (Signed)
Patient has not voided since returning from surgery at around [place]. Bladder scan completed. Revealed 518cc of urine. Dr. Damita Dunnings notified. Order givent to in and out cath patient.

## 2021-04-24 NOTE — Progress Notes (Signed)
PROGRESS NOTE    Tara Scott  [id number] DOB: 06/11/1961 DOA: 04/22/2021 PCP: Virginia Crews, MD  [id number]   Assessment & Plan:   Principal Problem:   Closed right hip fracture [facility]) Active Problems:   Anxiety   Fall at home, initial encounter   Tobacco abuse   Acquired hypothyroidism   Right hip pain    Tara Scott is a 60 y.o. female with medical history significant for acquired hypothyroidism, chronic tobacco abuse, generalized anxiety disorder, who is admitted to [facility] on 04/22/2021 with acute right femoral neck fracture after presenting from home to [facility] ED complaining of acute right hip pain following ground-level fall.    #) Acute right femoral neck fracture:  S/p TOTAL HIP ARTHROPLASTY --pain control --PT, d/c to SNF rehab --Weight-Bearing as tolerated to right leg    #) Generalized anxiety disorder:  --cont home Celexa    #) Acquired hypothyroidism:  --cont Synthroid    #) Chronic tobacco abuse:  smoked 0.5 packs/day over the last 40 years. --smoking cessation advised  # N/V, improved --likely from opioids --supportive care    DVT prophylaxis: Lovenox SQ Code Status: Full code  Family Communication:  Level of care: Med-Surg Dispo:   The patient is from: home Anticipated d/c is to: SNF Anticipated d/c date is: whenever bed available Patient currently is medically ready to d/c.   Subjective and Interval History:  Pt reported feeling much better than expected.  Nausea with opioids pain meds controlled with anti-emetics.  Eating ok.   Objective: Vitals:   04/24/21 0456 04/24/21 0809 04/24/21 1142 04/24/21 1631  BP: 112/63 (!) 114/58 105/63 (!) 116/56  Pulse: (!) 58 67 72 86  Resp: '17 15 14 15  '$ Temp: 98.2 F (36.8 C) 98 F (36.7 C) 98.6 F (37 C) 98.2 F (36.8 C)  TempSrc:      SpO2: 97% 95% 97% 90%  Weight:      Height:        Intake/Output Summary (Last 24 hours) at 04/24/2021 1647 Last data  filed at 04/24/2021 1026 Gross per 24 hour  Intake 1922.3 ml  Output 50 ml  Net 1872.3 ml   Filed Weights   04/22/21 1808 04/23/21 0500  Weight: 63.5 kg 67.1 kg    Examination:   Constitutional: NAD, AAOx3, sitting in recliner eating HEENT: conjunctivae and lids normal, EOMI CV: No cyanosis.   RESP: normal respiratory effort, on RA Neuro: II - XII grossly intact.   Psych: Normal mood and affect.  Appropriate judgement and reason   Data Reviewed: I have personally reviewed following labs and imaging studies  CBC: Recent Labs  Lab 04/22/21 1808 04/23/21 0419 04/24/21 0508  WBC 9.0 11.3* 11.2*  NEUTROABS 6.0 10.2*  --   HGB 13.0 13.4 10.7*  HCT 37.2 40.6 32.1*  MCV 96.6 98.5 99.1  PLT 265 251 [id number]   Basic Metabolic Panel: Recent Labs  Lab 04/22/21 1808 04/23/21 0419 04/24/21 0508  NA 141 139 137  K 4.0 4.5 3.8  CL 103 102 105  CO2 '29 31 28  '$ GLUCOSE 101* 162* 133*  BUN '20 20 14  '$ CREATININE 0.49 0.41* <0.30*  CALCIUM 9.2 8.9 8.3*  MG 1.9 2.2 2.0   GFR: CrCl cannot be calculated (This lab value cannot be used to calculate CrCl because it is not a number: <0.30). Liver Function Tests: Recent Labs  Lab 04/22/21 1808 04/23/21 0419  AST 27 41  ALT 21 27  ALKPHOS 59 66  BILITOT 0.6 0.5  PROT 7.1 7.8  ALBUMIN 4.0 4.1   No results for input(s): LIPASE, AMYLASE in the last 168 hours. No results for input(s): AMMONIA in the last 168 hours. Coagulation Profile: Recent Labs  Lab 04/22/21 1808  INR 1.0   Cardiac Enzymes: No results for input(s): CKTOTAL, CKMB, CKMBINDEX, TROPONINI in the last 168 hours. BNP (last 3 results) No results for input(s): PROBNP in the last 8760 hours. HbA1C: No results for input(s): HGBA1C in the last 72 hours. CBG: Recent Labs  Lab 04/23/21 0751  GLUCAP 148*   Lipid Profile: No results for input(s): CHOL, HDL, LDLCALC, TRIG, CHOLHDL, LDLDIRECT in the last 72 hours. Thyroid Function Tests: Recent Labs    04/23/21 0419   TSH 4.168   Anemia Panel: No results for input(s): VITAMINB12, FOLATE, FERRITIN, TIBC, IRON, RETICCTPCT in the last 72 hours. Sepsis Labs: No results for input(s): PROCALCITON, LATICACIDVEN in the last 168 hours.  Recent Results (from the past 240 hour(s))  Resp Panel by RT-PCR (Flu A&B, Covid) Nasopharyngeal Swab     Status: None   Collection Time: 04/22/21  7:48 PM   Specimen: Nasopharyngeal Swab; Nasopharyngeal(NP) swabs in vial transport medium  Result Value Ref Range Status   SARS Coronavirus 2 by RT PCR NEGATIVE NEGATIVE Final    Comment: (NOTE) SARS-CoV-2 target nucleic acids are NOT DETECTED.  The SARS-CoV-2 RNA is generally detectable in upper respiratory specimens during the acute phase of infection. The lowest concentration of SARS-CoV-2 viral copies this assay can detect is 138 copies/mL. A negative result does not preclude SARS-Cov-2 infection and should not be used as the sole basis for treatment or other patient management decisions. A negative result may occur with  improper specimen collection/handling, submission of specimen other than nasopharyngeal swab, presence of viral mutation(s) within the areas targeted by this assay, and inadequate number of viral copies(<138 copies/mL). A negative result must be combined with clinical observations, patient history, and epidemiological information. The expected result is Negative.  Fact Sheet for Patients:  EntrepreneurPulse.com.au  Fact Sheet for Healthcare Providers:  IncredibleEmployment.be  This test is no t yet approved or cleared by the [place] FDA and  has been authorized for detection and/or diagnosis of SARS-CoV-2 by FDA under an Emergency Use Authorization (EUA). This EUA will remain  in effect (meaning this test can be used) for the duration of the COVID-19 declaration under Section 564(b)(1) of the Act, 21 U.S.C.section 360bbb-3(b)(1), unless the authorization is  terminated  or revoked sooner.       Influenza A by PCR NEGATIVE NEGATIVE Final   Influenza B by PCR NEGATIVE NEGATIVE Final    Comment: (NOTE) The Xpert Xpress SARS-CoV-2/FLU/RSV plus assay is intended as an aid in the diagnosis of influenza from Nasopharyngeal swab specimens and should not be used as a sole basis for treatment. Nasal washings and aspirates are unacceptable for Xpert Xpress SARS-CoV-2/FLU/RSV testing.  Fact Sheet for Patients: EntrepreneurPulse.com.au  Fact Sheet for Healthcare Providers: IncredibleEmployment.be  This test is not yet approved or cleared by the [place] FDA and has been authorized for detection and/or diagnosis of SARS-CoV-2 by FDA under an Emergency Use Authorization (EUA). This EUA will remain in effect (meaning this test can be used) for the duration of the COVID-19 declaration under Section 564(b)(1) of the Act, 21 U.S.C. section 360bbb-3(b)(1), unless the authorization is terminated or revoked.  Performed at [facility], [address] [place], [place] [address]  Surgical pcr screen     Status: None   Collection Time: 04/23/21 10:27 AM   Specimen: Nasal Mucosa; Nasal Swab  Result Value Ref Range Status   MRSA, PCR NEGATIVE NEGATIVE Final   Staphylococcus aureus NEGATIVE NEGATIVE Final    Comment: (NOTE) The Xpert SA Assay (FDA approved for NASAL specimens in patients 64 years of age and older), is one component of a comprehensive surveillance program. It is not intended to diagnose infection nor to guide or monitor treatment. Performed at [facility], [address] [place], [place] [address]       Radiology Studies: DG Knee 2 Views Right  Result Date: 04/22/2021 CLINICAL DATA:  Hip fracture EXAM: RIGHT KNEE - 1-2 VIEW COMPARISON:  None. FINDINGS: No evidence of fracture, dislocation, or joint effusion. No evidence of arthropathy or other focal bone abnormality.  Soft tissues are unremarkable. IMPRESSION: Negative. Electronically Signed   By: Donavan Foil M.D.   On: 04/22/2021 20:39   DG Chest Port 1 View  Result Date: 04/22/2021 CLINICAL DATA:  Fall.  Right hip fracture EXAM: PORTABLE CHEST 1 VIEW COMPARISON:  10/20/2012 FINDINGS: The heart size and mediastinal contours are within normal limits. Both lungs are clear. The visualized skeletal structures are unremarkable. IMPRESSION: No active disease. Electronically Signed   By: Franchot Gallo M.D.   On: 04/22/2021 20:02   DG HIP OPERATIVE UNILAT W OR W/O PELVIS RIGHT  Result Date: 04/23/2021 CLINICAL DATA:  Right hip arthroplasty EXAM: OPERATIVE RIGHT HIP (WITH PELVIS IF PERFORMED) 2 VIEWS TECHNIQUE: Fluoroscopic spot image(s) were submitted for interpretation post-operatively. COMPARISON:  None. FINDINGS: There are 2 fluoroscopic clips obtained before and after right total arthroplasty. IMPRESSION: Intraoperative fluoroscopy Electronically Signed   By: Ulyses Jarred M.D.   On: 04/23/2021 19:11   DG HIP UNILAT W OR W/O PELVIS 2-3 VIEWS RIGHT  Result Date: 04/23/2021 CLINICAL DATA:  Total hip arthroplasty EXAM: DG HIP (WITH OR WITHOUT PELVIS) 2-3V RIGHT COMPARISON:  04/22/2021 FINDINGS: Status post right total hip arthroplasty with expected postsurgical findings including drain and skin staples. No periprosthetic abnormality. IMPRESSION: Expected postsurgical findings of right total hip arthroplasty. Electronically Signed   By: Ulyses Jarred M.D.   On: 04/23/2021 19:11   DG Hip Unilat W or Wo Pelvis 2-3 Views Right  Result Date: 04/22/2021 CLINICAL DATA:  Fall. EXAM: DG HIP (WITH OR WITHOUT PELVIS) 2-3V RIGHT COMPARISON:  11/29/2019 FINDINGS: Fracture right femoral neck with mild impaction angulation. Right hip joint space normal. No other fracture. IMPRESSION: Angulated right femoral neck fracture. Electronically Signed   By: Franchot Gallo M.D.   On: 04/22/2021 20:02   DG Femur Min 2 Views Right  Result  Date: 04/22/2021 CLINICAL DATA:  Hip fracture EXAM: RIGHT FEMUR 2 VIEWS COMPARISON:  Hip radiograph 04/22/2021 FINDINGS: Acute mildly displaced right femoral neck fracture. No femoral head dislocation. IMPRESSION: Acute right femoral neck fracture Electronically Signed   By: Donavan Foil M.D.   On: 04/22/2021 20:39     Scheduled Meds:  acetaminophen  1,000 mg Oral Q6H   citalopram  40 mg Oral Daily   docusate sodium  100 mg Oral BID   enoxaparin (LOVENOX) injection  40 mg Subcutaneous Q24H   levothyroxine  100 mcg Oral Q0600   pantoprazole  40 mg Oral Daily   polyethylene glycol  17 g Oral BID   Continuous Infusions:  sodium chloride 100 mL/hr at 04/23/21 1957   methocarbamol (ROBAXIN) IV  LOS: 2 days     Enzo Bi, MD [facility] If 7PM-7AM, please contact night-coverage 04/24/2021, 4:47 PM

## 2021-04-25 LAB — CBC
HCT: 28.4 % — ABNORMAL LOW (ref 36.0–46.0)
Hemoglobin: 9.4 g/dL — ABNORMAL LOW (ref 12.0–15.0)
MCH: 32.5 pg (ref 26.0–34.0)
MCHC: 33.1 g/dL (ref 30.0–36.0)
MCV: 98.3 fL (ref 80.0–100.0)
Platelets: 171 10*3/uL (ref 150–400)
RBC: 2.89 MIL/uL — ABNORMAL LOW (ref 3.87–5.11)
RDW: 13 % (ref 11.5–15.5)
WBC: 6.8 10*3/uL (ref 4.0–10.5)
nRBC: 0 % (ref 0.0–0.2)

## 2021-04-25 LAB — BASIC METABOLIC PANEL
Anion gap: 7 (ref 5–15)
BUN: 13 mg/dL (ref 6–20)
CO2: 28 mmol/L (ref 22–32)
Calcium: 7.9 mg/dL — ABNORMAL LOW (ref 8.9–10.3)
Chloride: 104 mmol/L (ref 98–111)
Creatinine, Ser: 0.34 mg/dL — ABNORMAL LOW (ref 0.44–1.00)
GFR, Estimated: >60 mL/min (ref >60)
Glucose, Bld: 95 mg/dL (ref 70–99)
Potassium: 3.7 mmol/L (ref 3.5–5.1)
Sodium: 139 mmol/L (ref 135–145)

## 2021-04-25 LAB — MAGNESIUM: Magnesium: 1.9 mg/dL (ref 1.7–2.4)

## 2021-04-25 MED ORDER — OXYCODONE HCL 5 MG PO TABS: 5.0000 mg | ORAL_TABLET | ORAL | 0 refills | Status: DC | PRN

## 2021-04-25 MED ORDER — METHOCARBAMOL 500 MG PO TABS: 500.0000 mg | ORAL_TABLET | Freq: Four times a day (QID) | ORAL | 0 refills | Status: DC | PRN

## 2021-04-25 MED ORDER — DOCUSATE SODIUM 100 MG PO CAPS: 100.0000 mg | ORAL_CAPSULE | Freq: Two times a day (BID) | ORAL | 0 refills | Status: DC

## 2021-04-25 MED ORDER — ENOXAPARIN SODIUM 40 MG/0.4ML IJ SOSY: 40.0000 mg | PREFILLED_SYRINGE | INTRAMUSCULAR | 0 refills | Status: DC

## 2021-04-25 NOTE — Progress Notes (Signed)
Physical Therapy Treatment Patient Details Name: Tara Scott MRN: [id number] DOB: October 19, 1960 Today's Date: 04/25/2021    History of Present Illness Tara Scott is a 60 y.o. female with medical history significant for acquired hypothyroidism, chronic tobacco abuse, generalized anxiety disorder, who is admitted to [facility] on 04/22/2021 with acute right femoral neck fracture after presenting from home to [facility] ED complaining of acute right hip pain following ground-level fall. History of closed fracture of spine at C1-C4 level with central cervical cord lesion form of MD.    PT Comments    Pt was long sitting in bed upon arriving. She endorses severe pain but is willing to participate. Has extensive past medical history. Pain severely limits session progression. More assistance required to exit bed, stand, and take several very antalgic steps to recliner. Likes to perform task her way. Overall tolerated session but very limited. Will return later this afternoon and continue to progress as able per POC.   Follow Up Recommendations  SNF     Equipment Recommendations  Rolling walker with 5" wheels       Precautions / Restrictions Precautions Precautions: Anterior Hip;Fall Precaution Booklet Issued: Yes (comment) Precaution Comments: direct anterior hip Restrictions Weight Bearing Restrictions: Yes RLE Weight Bearing: Weight bearing as tolerated    Mobility  Bed Mobility Overal bed mobility: Needs Assistance Bed Mobility: Sidelying to Sit;Supine to Sit   Sidelying to sit: Mod assist;Max assist Supine to sit: Max assist     General bed mobility comments: Increased time required with extensive assistance/use of drawpad. very pain limited.    Transfers Overall transfer level: Needs assistance Equipment used: Rolling walker (2 wheeled) Transfers: Sit to/from Stand Sit to Stand: Max assist;From elevated surface         General transfer comment: Max  assist of one to stand from elevated bed height. pt is particular about technique to stand. Prefers to perform task her way.  Ambulation/Gait Ambulation/Gait assistance: Min assist;Mod assist Gait Distance (Feet): 5 Feet Assistive device: Rolling walker (2 wheeled) Gait Pattern/deviations: Step-to pattern;Decreased step length - right;Decreased step length - left Gait velocity: decr   General Gait Details: pt has poor antalgic step to pattern with extremely wide BOS. Unwilling to attempt technique recommended.Severely pain limited       Balance Overall balance assessment: Needs assistance Sitting-balance support: Feet supported Sitting balance-Leahy Scale: Good     Standing balance support: Bilateral upper extremity supported;During functional activity Standing balance-Leahy Scale: Poor Standing balance comment: needs constant assistance for safety in standing      Cognition Arousal/Alertness: Awake/alert Behavior During Therapy: Anxious Overall Cognitive Status: Within Functional Limits for tasks assessed    General Comments: Pt yells out and is very anxious with movements. Overall did put great effort into session but severely limited by pain and extensive medical history.             Pertinent Vitals/Pain Pain Assessment: 0-10 Pain Score: 8  Faces Pain Scale: Hurts whole lot Pain Location: R hip/thigh Pain Descriptors / Indicators: Discomfort;Sore;Grimacing;Guarding;Moaning Pain Intervention(s): Limited activity within patient's tolerance;Monitored during session;Premedicated before session;Repositioned     PT Goals (current goals can now be found in the care plan section) Acute Rehab PT Goals Patient Stated Goal: none Progress towards PT goals: Progressing toward goals (pain limiting progress)    Frequency    BID      PT Plan Current plan remains appropriate       AM-PAC PT "6 Clicks" Mobility  Outcome Measure  Help needed turning from your back to  your side while in a flat bed without using bedrails?: A Lot Help needed moving from lying on your back to sitting on the side of a flat bed without using bedrails?: A Lot Help needed moving to and from a bed to a chair (including a wheelchair)?: A Lot Help needed standing up from a chair using your arms (e.g., wheelchair or bedside chair)?: A Lot Help needed to walk in hospital room?: A Lot Help needed climbing 3-5 steps with a railing? : Total 6 Click Score: 11    End of Session Equipment Utilized During Treatment: Gait belt Activity Tolerance: Patient limited by pain;Patient limited by fatigue Patient left: in chair;with call bell/phone within reach;with chair alarm set Nurse Communication: Mobility status PT Visit Diagnosis: Unsteadiness on feet (R26.81);Other abnormalities of gait and mobility (R26.89);Muscle weakness (generalized) (M62.81);Difficulty in walking, not elsewhere classified (R26.2);History of falling (Z91.81);Pain Pain - Right/Left: Right Pain - part of body: Leg     Time: 1015-1046 PT Time Calculation (min) (ACUTE ONLY): 31 min  Charges:  $Gait Training: 8-22 mins $Therapeutic Activity: 8-22 mins                     Julaine Fusi PTA 04/25/21, 12:58 PM

## 2021-04-25 NOTE — Progress Notes (Signed)
PROGRESS NOTE    ARCENIA Scott  [id number] DOB: [birth year] DOA: 04/22/2021 PCP: Virginia Crews, MD  [id number]/[id number]-AA   Assessment & Plan:   Principal Problem:   Closed right hip fracture [facility]) Active Problems:   Anxiety   Fall at home, initial encounter   Tobacco abuse   Acquired hypothyroidism   Right hip pain    Tara Scott is a 60 y.o. female with medical history significant for acquired hypothyroidism, chronic tobacco abuse, generalized anxiety disorder, who is admitted to [facility] on 04/22/2021 with acute right femoral neck fracture after presenting from home to [facility] ED complaining of acute right hip pain following ground-level fall.    #) Acute right femoral neck fracture:  S/p TOTAL HIP ARTHROPLASTY --pain control --PT, d/c to SNF rehab --Weight-Bearing as tolerated to right leg    #) Generalized anxiety disorder:  --cont home Celexa    #) Acquired hypothyroidism:  --cont Synthroid    #) Chronic tobacco abuse:  smoked 0.5 packs/day over the last 40 years. --smoking cessation advised  # N/V, improved --likely from opioids --supportive care    DVT prophylaxis: Lovenox SQ Code Status: Full code  Family Communication:  Level of care: Med-Surg Dispo:   The patient is from: home Anticipated d/c is to: SNF Anticipated d/c date is: whenever bed available Patient currently is medically ready to d/c.   Subjective and Interval History:  Pt reported more pain this morning, which was controlled with pain meds.   Objective: Vitals:   04/24/21 2359 04/25/21 0456 04/25/21 0743 04/25/21 1228  BP: (!) 109/55 112/70 (!) 107/52 101/64  Pulse: 72 80 88 81  Resp: '16 17 16 14  '$ Temp: 98.5 F (36.9 C) 98.1 F (36.7 C) 98.2 F (36.8 C) 99.2 F (37.3 C)  TempSrc:      SpO2: 96% 100% 100% 91%  Weight:      Height:        Intake/Output Summary (Last 24 hours) at 04/25/2021 1545 Last data filed at 04/25/2021 0000 Gross per 24 hour   Intake --  Output 1300 ml  Net -1300 ml   Filed Weights   04/22/21 1808 04/23/21 0500  Weight: 63.5 kg 67.1 kg    Examination:   Constitutional: NAD, AAOx3, sitting up in recliner HEENT: conjunctivae and lids normal, EOMI CV: No cyanosis.   RESP: normal respiratory effort, on RA SKIN: warm, dry Neuro: II - XII grossly intact.   Psych: Normal mood and affect.  Appropriate judgement and reason   Data Reviewed: I have personally reviewed following labs and imaging studies  CBC: Recent Labs  Lab 04/22/21 1808 04/23/21 0419 04/24/21 0508 04/25/21 0538  WBC 9.0 11.3* 11.2* 6.8  NEUTROABS 6.0 10.2*  --   --   HGB 13.0 13.4 10.7* 9.4*  HCT 37.2 40.6 32.1* 28.4*  MCV 96.6 98.5 99.1 98.3  PLT 265 251 170 [id number]   Basic Metabolic Panel: Recent Labs  Lab 04/22/21 1808 04/23/21 0419 04/24/21 0508 04/25/21 0538  NA 141 139 137 139  K 4.0 4.5 3.8 3.7  CL 103 102 105 104  CO2 '29 31 28 28  '$ GLUCOSE 101* 162* 133* 95  BUN '20 20 14 13  '$ CREATININE 0.49 0.41* <0.30* 0.34*  CALCIUM 9.2 8.9 8.3* 7.9*  MG 1.9 2.2 2.0 1.9   GFR: Estimated Creatinine Clearance: 67.2 mL/min (A) (by C-G formula based on SCr of 0.34 mg/dL (L)). Liver Function Tests: Recent Labs  Lab 04/22/21 1808  04/23/21 0419  AST 27 41  ALT 21 27  ALKPHOS 59 66  BILITOT 0.6 0.5  PROT 7.1 7.8  ALBUMIN 4.0 4.1   No results for input(s): LIPASE, AMYLASE in the last 168 hours. No results for input(s): AMMONIA in the last 168 hours. Coagulation Profile: Recent Labs  Lab 04/22/21 1808  INR 1.0   Cardiac Enzymes: No results for input(s): CKTOTAL, CKMB, CKMBINDEX, TROPONINI in the last 168 hours. BNP (last 3 results) No results for input(s): PROBNP in the last 8760 hours. HbA1C: No results for input(s): HGBA1C in the last 72 hours. CBG: Recent Labs  Lab 04/23/21 0751  GLUCAP 148*   Lipid Profile: No results for input(s): CHOL, HDL, LDLCALC, TRIG, CHOLHDL, LDLDIRECT in the last 72 hours. Thyroid  Function Tests: Recent Labs    04/23/21 0419  TSH 4.168   Anemia Panel: No results for input(s): VITAMINB12, FOLATE, FERRITIN, TIBC, IRON, RETICCTPCT in the last 72 hours. Sepsis Labs: No results for input(s): PROCALCITON, LATICACIDVEN in the last 168 hours.  Recent Results (from the past 240 hour(s))  Resp Panel by RT-PCR (Flu A&B, Covid) Nasopharyngeal Swab     Status: None   Collection Time: 04/22/21  7:48 PM   Specimen: Nasopharyngeal Swab; Nasopharyngeal(NP) swabs in vial transport medium  Result Value Ref Range Status   SARS Coronavirus 2 by RT PCR NEGATIVE NEGATIVE Final    Comment: (NOTE) SARS-CoV-2 target nucleic acids are NOT DETECTED.  The SARS-CoV-2 RNA is generally detectable in upper respiratory specimens during the acute phase of infection. The lowest concentration of SARS-CoV-2 viral copies this assay can detect is 138 copies/mL. A negative result does not preclude SARS-Cov-2 infection and should not be used as the sole basis for treatment or other patient management decisions. A negative result may occur with  improper specimen collection/handling, submission of specimen other than nasopharyngeal swab, presence of viral mutation(s) within the areas targeted by this assay, and inadequate number of viral copies(<138 copies/mL). A negative result must be combined with clinical observations, patient history, and epidemiological information. The expected result is Negative.  Fact Sheet for Patients:  EntrepreneurPulse.com.au  Fact Sheet for Healthcare Providers:  IncredibleEmployment.be  This test is no t yet approved or cleared by the [place] FDA and  has been authorized for detection and/or diagnosis of SARS-CoV-2 by FDA under an Emergency Use Authorization (EUA). This EUA will remain  in effect (meaning this test can be used) for the duration of the COVID-19 declaration under Section 564(b)(1) of the Act,  21 U.S.C.section 360bbb-3(b)(1), unless the authorization is terminated  or revoked sooner.       Influenza A by PCR NEGATIVE NEGATIVE Final   Influenza B by PCR NEGATIVE NEGATIVE Final    Comment: (NOTE) The Xpert Xpress SARS-CoV-2/FLU/RSV plus assay is intended as an aid in the diagnosis of influenza from Nasopharyngeal swab specimens and should not be used as a sole basis for treatment. Nasal washings and aspirates are unacceptable for Xpert Xpress SARS-CoV-2/FLU/RSV testing.  Fact Sheet for Patients: EntrepreneurPulse.com.au  Fact Sheet for Healthcare Providers: IncredibleEmployment.be  This test is not yet approved or cleared by the [place] FDA and has been authorized for detection and/or diagnosis of SARS-CoV-2 by FDA under an Emergency Use Authorization (EUA). This EUA will remain in effect (meaning this test can be used) for the duration of the COVID-19 declaration under Section 564(b)(1) of the Act, 21 U.S.C. section 360bbb-3(b)(1), unless the authorization is terminated or revoked.  Performed at [facility], [address], [place], [place] [address]   Surgical pcr screen     Status: None   Collection Time: 04/23/21 10:27 AM   Specimen: Nasal Mucosa; Nasal Swab  Result Value Ref Range Status   MRSA, PCR NEGATIVE NEGATIVE Final   Staphylococcus aureus NEGATIVE NEGATIVE Final    Comment: (NOTE) The Xpert SA Assay (FDA approved for NASAL specimens in patients 41 years of age and older), is one component of a comprehensive surveillance program. It is not intended to diagnose infection nor to guide or monitor treatment. Performed at [facility], [address] [place], [place] [address]       Radiology Studies: DG HIP OPERATIVE UNILAT W OR W/O PELVIS RIGHT  Result Date: 04/23/2021 CLINICAL DATA:  Right hip arthroplasty EXAM: OPERATIVE RIGHT HIP (WITH PELVIS IF PERFORMED) 2 VIEWS TECHNIQUE:  Fluoroscopic spot image(s) were submitted for interpretation post-operatively. COMPARISON:  None. FINDINGS: There are 2 fluoroscopic clips obtained before and after right total arthroplasty. IMPRESSION: Intraoperative fluoroscopy Electronically Signed   By: Ulyses Jarred M.D.   On: 04/23/2021 19:11   DG HIP UNILAT W OR W/O PELVIS 2-3 VIEWS RIGHT  Result Date: 04/23/2021 CLINICAL DATA:  Total hip arthroplasty EXAM: DG HIP (WITH OR WITHOUT PELVIS) 2-3V RIGHT COMPARISON:  04/22/2021 FINDINGS: Status post right total hip arthroplasty with expected postsurgical findings including drain and skin staples. No periprosthetic abnormality. IMPRESSION: Expected postsurgical findings of right total hip arthroplasty. Electronically Signed   By: Ulyses Jarred M.D.   On: 04/23/2021 19:11     Scheduled Meds:  citalopram  40 mg Oral Daily   docusate sodium  100 mg Oral BID   enoxaparin (LOVENOX) injection  40 mg Subcutaneous Q24H   levothyroxine  100 mcg Oral Q0600   pantoprazole  40 mg Oral Daily   polyethylene glycol  17 g Oral BID   Continuous Infusions:  methocarbamol (ROBAXIN) IV       LOS: 3 days     Enzo Bi, MD [facility] If 7PM-7AM, please contact night-coverage 04/25/2021, 3:45 PM

## 2021-04-25 NOTE — Progress Notes (Signed)
   Subjective: 2 Days Post-Op Procedure(s) (LRB): TOTAL HIP ARTHROPLASTY ANTERIOR APPROACH (Right) Patient reports pain as moderate.   Patient is well, and has had no acute complaints or problems Denies any CP, SOB, ABD pain. We will continue therapy today.  Plan is to go Home after hospital stay.  Objective: Vital signs in last 24 hours: Temp:  [98.1 F (36.7 C)-98.6 F (37 C)] 98.2 F (36.8 C) (08/06 0743) Pulse Rate:  [72-88] 88 (08/06 0743) Resp:  [14-17] 16 (08/06 0743) BP: (105-116)/(52-70) 107/52 (08/06 0743) SpO2:  [90 %-100 %] 100 % (08/06 0743)  Intake/Output from previous day: 08/05 0701 - 08/06 0700 In: 120 [P.O.:120] Out: 1300 [Urine:1300] Intake/Output this shift: No intake/output data recorded.  Recent Labs    04/22/21 1808 04/23/21 0419 04/24/21 0508 04/25/21 0538  HGB 13.0 13.4 10.7* 9.4*   Recent Labs    04/24/21 0508 04/25/21 0538  WBC 11.2* 6.8  RBC 3.24* 2.89*  HCT 32.1* 28.4*  PLT 170 171   Recent Labs    04/24/21 0508 04/25/21 0538  NA 137 139  K 3.8 3.7  CL 105 104  CO2 28 28  BUN 14 13  CREATININE <0.30* 0.34*  GLUCOSE 133* 95  CALCIUM 8.3* 7.9*   Recent Labs    04/22/21 1808  INR 1.0    EXAM General - Patient is Alert, Appropriate, and Oriented Extremity - Neurovascular intact Sensation intact distally Intact pulses distally Dorsiflexion/Plantar flexion intact Dressing - dressing C/D/I and no drainage Motor Function - intact, moving foot and toes well on exam.  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    Malignant hyperthermia    Toxic  multinodular goiter     Assessment/Plan:   2 Days Post-Op Procedure(s) (LRB): TOTAL HIP ARTHROPLASTY ANTERIOR APPROACH (Right) Principal Problem:   Closed right hip fracture (HCC) Active Problems:   Anxiety   Fall at home, initial encounter   Tobacco abuse   Acquired hypothyroidism   Right hip pain  Estimated body mass index is 27.06 kg/m as calculated from the following:   Height as of this encounter: '5\' 2"'$  (1.575 m).   Weight as of this encounter: 67.1 kg. Advance diet Up with therapy Work on bowel movement Vital signs are stable Labs are stable Pain control Care management to assist with discharge to skilled nursing facility   Follow-up with [facility] orthopedics in 2 weeks Remove Praveena and apply honeycomb dressing on 05/03/2021 Lovenox 40 mg subcu daily x14 days at discharge TED hose bilateral lower extremity x6 weeks    DVT Prophylaxis - Lovenox, Foot Pumps, and TED hose Weight-Bearing as tolerated to right leg   T. Rachelle Hora, PA-C [place] 04/25/2021, 8:54 AM

## 2021-04-25 NOTE — Progress Notes (Signed)
Physical Therapy Treatment Patient Details Name: Tara Scott MRN: [id number] DOB: Jan 24, 1961 Today's Date: 04/25/2021    History of Present Illness Tara Scott is a 60 y.o. female with medical history significant for acquired hypothyroidism, chronic tobacco abuse, generalized anxiety disorder, who is admitted to [facility] on 04/22/2021 with acute right femoral neck fracture after presenting from home to [facility] ED complaining of acute right hip pain following ground-level fall. History of closed fracture of spine at C1-C4 level with central cervical cord lesion form of MD.    PT Comments    Pt was sitting in recliner upon arriving. She was pre-medicated prior to session however continues to be severely limited by pain. Pt is anxious with all mobility, transfers, and gait. Moans/yells out with any movement of RLE. She required max assist to stand from recliner with vcs for technique improvements throughout. Took ~ 3 steps to EOB from recliner prior to needing max assist to return to supine. Once repositioned in supine, pt performed there ex in bed with assistance and encouragement. Tara Scott will greatly benefit from SNF at [place] to address deficits while assisting her to PLOF.    Follow Up Recommendations  SNF     Equipment Recommendations  Rolling walker with 5" wheels       Precautions / Restrictions Precautions Precautions: Anterior Hip;Fall Precaution Booklet Issued: Yes (comment) Precaution Comments: direct anterior hip Restrictions Weight Bearing Restrictions: Yes RLE Weight Bearing: Weight bearing as tolerated    Mobility  Bed Mobility Overal bed mobility: Needs Assistance Bed Mobility: Supine to Sit       Sit to supine: Mod assist;Max assist        Transfers Overall transfer level: Needs assistance Equipment used: Rolling walker (2 wheeled) Transfers: Sit to/from Stand Sit to Stand: Max assist;From elevated surface         General transfer  comment: max assist to stand from recliner with vcs for technique improvements  Ambulation/Gait Ambulation/Gait assistance: Mod assist Gait Distance (Feet): 3 Feet Assistive device: Rolling walker (2 wheeled) Gait Pattern/deviations: Step-to pattern;Decreased step length - right;Decreased step length - left Gait velocity: decr   General Gait Details: pt wa able to take several antalgic steps from recliner to EOB    Balance Overall balance assessment: Needs assistance Sitting-balance support: Feet supported Sitting balance-Leahy Scale: Good     Standing balance support: Bilateral upper extremity supported;During functional activity Standing balance-Leahy Scale: Poor Standing balance comment: needs constant assistance for safety in standing      Cognition Arousal/Alertness: Awake/alert Behavior During Therapy: Anxious Overall Cognitive Status: Within Functional Limits for tasks assessed        General Comments: pt is A and O x 4 but anxious. moans/groans with any all mobility/movements      Exercises Total Joint Exercises Ankle Circles/Pumps: AROM;Both;10 reps Quad Sets: AROM;10 reps Gluteal Sets: AROM;10 reps Towel Squeeze: AROM;10 reps Heel Slides: AROM;10 reps (limited ROM) Hip ABduction/ADduction: AAROM;Right;10 reps Straight Leg Raises: AAROM;10 reps        Pertinent Vitals/Pain Pain Assessment: 0-10 Pain Score: 7  Pain Location: R hip/thigh Pain Descriptors / Indicators: Discomfort;Sore;Grimacing;Guarding;Moaning Pain Intervention(s): Limited activity within patient's tolerance;Monitored during session;Premedicated before session;Repositioned     PT Goals (current goals can now be found in the care plan section) Acute Rehab PT Goals Patient Stated Goal: rehab then home Progress towards PT goals: Progressing toward goals (limited progress due to pain)    Frequency    BID  PT Plan Current plan remains appropriate       AM-PAC PT "6 Clicks"  Mobility   Outcome Measure  Help needed turning from your back to your side while in a flat bed without using bedrails?: A Lot Help needed moving from lying on your back to sitting on the side of a flat bed without using bedrails?: A Lot Help needed moving to and from a bed to a chair (including a wheelchair)?: A Lot Help needed standing up from a chair using your arms (e.g., wheelchair or bedside chair)?: A Lot Help needed to walk in hospital room?: A Lot Help needed climbing 3-5 steps with a railing? : Total 6 Click Score: 11    End of Session Equipment Utilized During Treatment: Gait belt Activity Tolerance: Patient limited by pain Patient left: in bed;with call bell/phone within reach;with bed alarm set;with family/visitor present Nurse Communication: Mobility status PT Visit Diagnosis: Unsteadiness on feet (R26.81);Other abnormalities of gait and mobility (R26.89);Muscle weakness (generalized) (M62.81);Difficulty in walking, not elsewhere classified (R26.2);History of falling (Z91.81);Pain Pain - Right/Left: Right Pain - part of body: Leg     Time: [id number] PT Time Calculation (min) (ACUTE ONLY): 24 min  Charges:  $Therapeutic Exercise: 8-22 mins $Therapeutic Activity: 8-22 mins                     Julaine Fusi PTA 04/25/21, 4:54 PM

## 2021-04-25 NOTE — Discharge Instructions (Signed)

## 2021-04-25 NOTE — TOC Progression Note (Signed)
Transition of Care [facility]) - Progression Note    Patient Details  Name: Tara Scott MRN: [id number] Date of Birth: 1961/09/02  Transition of Care [facility]) CM/SW [place], [place] Phone Number: 04/25/2021, 10:22 AM  Clinical Narrative:     CSW spoke with patient regarding her only bed offer at this time of [facility] in [place]. Patient reports she would prefer to be closer to her son, with a preference for [facility] however patient has no other bed offers at this time, with other offers pending. Patient reports she would like to see if she gets other bed offers.   CSW did call [facility] and started patient's insurance auth for going to SNF. They report to call them to change facility if patient gets other bed offers besides [facility].        Expected Discharge Plan and Services                                                 Social Determinants of Health (SDOH) Interventions    Readmission Risk Interventions No flowsheet data found.

## 2021-04-26 LAB — BASIC METABOLIC PANEL
Anion gap: 5 (ref 5–15)
BUN: 12 mg/dL (ref 6–20)
CO2: 29 mmol/L (ref 22–32)
Calcium: 7.9 mg/dL — ABNORMAL LOW (ref 8.9–10.3)
Chloride: 104 mmol/L (ref 98–111)
Creatinine, Ser: 0.34 mg/dL — ABNORMAL LOW (ref 0.44–1.00)
GFR, Estimated: >60 mL/min (ref >60)
Glucose, Bld: 98 mg/dL (ref 70–99)
Potassium: 3.9 mmol/L (ref 3.5–5.1)
Sodium: 138 mmol/L (ref 135–145)

## 2021-04-26 LAB — CBC
HCT: 28.6 % — ABNORMAL LOW (ref 36.0–46.0)
Hemoglobin: 9.5 g/dL — ABNORMAL LOW (ref 12.0–15.0)
MCH: 32.9 pg (ref 26.0–34.0)
MCHC: 33.2 g/dL (ref 30.0–36.0)
MCV: 99 fL (ref 80.0–100.0)
Platelets: 189 10*3/uL (ref 150–400)
RBC: 2.89 MIL/uL — ABNORMAL LOW (ref 3.87–5.11)
RDW: 13 % (ref 11.5–15.5)
WBC: 6.4 10*3/uL (ref 4.0–10.5)
nRBC: 0 % (ref 0.0–0.2)

## 2021-04-26 LAB — MAGNESIUM: Magnesium: 2 mg/dL (ref 1.7–2.4)

## 2021-04-26 MED ORDER — POLYETHYLENE GLYCOL 3350 17 G PO PACK
34.0000 g | PACK | ORAL | Status: DC
  Administered 2021-04-26: 34 g via ORAL
  Filled 2021-04-26 (×2): qty 2

## 2021-04-26 NOTE — Progress Notes (Signed)
PROGRESS NOTE    Tara Scott  [id number] DOB: 1960/12/02 DOA: 04/22/2021 PCP: Virginia Crews, MD  [id number]   Assessment & Plan:   Principal Problem:   Closed right hip fracture [facility]) Active Problems:   Anxiety   Fall at home, initial encounter   Tobacco abuse   Acquired hypothyroidism   Right hip pain    Tara Scott is a 60 y.o. female with medical history significant for acquired hypothyroidism, chronic tobacco abuse, generalized anxiety disorder, who is admitted to [facility] on 04/22/2021 with acute right femoral neck fracture after presenting from home to [facility] ED complaining of acute right hip pain following ground-level fall.    #) Acute right femoral neck fracture:  S/p TOTAL HIP ARTHROPLASTY  --pain control --PT, d/c to SNF rehab --Weight-Bearing as tolerated to right leg    #) Generalized anxiety disorder:  --cont home Celexa    #) Acquired hypothyroidism:  --cont Synthroid    #) Chronic tobacco abuse:  smoked 0.5 packs/day over the last 40 years. --smoking cessation advised  # N/V, improved --likely from opioids --supportive care    DVT prophylaxis: Lovenox SQ Code Status: Full code  Family Communication:  Level of care: Med-Surg Dispo:   The patient is from: home Anticipated d/c is to: SNF Anticipated d/c date is: whenever bed available Patient currently is medically ready to d/c.   Subjective and Interval History:  Pt reported back pain, for which pt was on heat pad.  Normal oral intake.   Objective: Vitals:   04/26/21 0456 04/26/21 0721 04/26/21 1226 04/26/21 1603  BP:  118/66 111/74 102/60  Pulse:  82 78 84  Resp:  '16 17 16  '$ Temp:  97.9 F (36.6 C) (!) 97.1 F (36.2 C) 98.9 F (37.2 C)  TempSrc:      SpO2:  96% 98% 95%  Weight: 63.2 kg     Height:        Intake/Output Summary (Last 24 hours) at 04/26/2021 1824 Last data filed at 04/26/2021 1106 Gross per 24 hour  Intake --  Output 1050 ml   Net -1050 ml   Filed Weights   04/23/21 0500 04/26/21 0449 04/26/21 0456  Weight: 67.1 kg 63.2 kg 63.2 kg    Examination:   Constitutional: NAD, AAOx3 HEENT: conjunctivae and lids normal, EOMI CV: No cyanosis.   RESP: normal respiratory effort, on RA SKIN: warm, dry Neuro: II - XII grossly intact.   Psych: Normal mood and affect.  Appropriate judgement and reason   Data Reviewed: I have personally reviewed following labs and imaging studies  CBC: Recent Labs  Lab 04/22/21 1808 04/23/21 0419 04/24/21 0508 04/25/21 0538 04/26/21 0458  WBC 9.0 11.3* 11.2* 6.8 6.4  NEUTROABS 6.0 10.2*  --   --   --   HGB 13.0 13.4 10.7* 9.4* 9.5*  HCT 37.2 40.6 32.1* 28.4* 28.6*  MCV 96.6 98.5 99.1 98.3 99.0  PLT 265 251 170 171 [id number]   Basic Metabolic Panel: Recent Labs  Lab 04/22/21 1808 04/23/21 0419 04/24/21 0508 04/25/21 0538 04/26/21 0458  NA 141 139 137 139 138  K 4.0 4.5 3.8 3.7 3.9  CL 103 102 105 104 104  CO2 '29 31 28 28 29  '$ GLUCOSE 101* 162* 133* 95 98  BUN '20 20 14 13 12  '$ CREATININE 0.49 0.41* <0.30* 0.34* 0.34*  CALCIUM 9.2 8.9 8.3* 7.9* 7.9*  MG 1.9 2.2 2.0 1.9 2.0   GFR: Estimated Creatinine Clearance:  65.3 mL/min (A) (by C-G formula based on SCr of 0.34 mg/dL (L)). Liver Function Tests: Recent Labs  Lab 04/22/21 1808 04/23/21 0419  AST 27 41  ALT 21 27  ALKPHOS 59 66  BILITOT 0.6 0.5  PROT 7.1 7.8  ALBUMIN 4.0 4.1   No results for input(s): LIPASE, AMYLASE in the last 168 hours. No results for input(s): AMMONIA in the last 168 hours. Coagulation Profile: Recent Labs  Lab 04/22/21 1808  INR 1.0   Cardiac Enzymes: No results for input(s): CKTOTAL, CKMB, CKMBINDEX, TROPONINI in the last 168 hours. BNP (last 3 results) No results for input(s): PROBNP in the last 8760 hours. HbA1C: No results for input(s): HGBA1C in the last 72 hours. CBG: Recent Labs  Lab 04/23/21 0751  GLUCAP 148*   Lipid Profile: No results for input(s): CHOL, HDL,  LDLCALC, TRIG, CHOLHDL, LDLDIRECT in the last 72 hours. Thyroid Function Tests: No results for input(s): TSH, T4TOTAL, FREET4, T3FREE, THYROIDAB in the last 72 hours.  Anemia Panel: No results for input(s): VITAMINB12, FOLATE, FERRITIN, TIBC, IRON, RETICCTPCT in the last 72 hours. Sepsis Labs: No results for input(s): PROCALCITON, LATICACIDVEN in the last 168 hours.  Recent Results (from the past 240 hour(s))  Resp Panel by RT-PCR (Flu A&B, Covid) Nasopharyngeal Swab     Status: None   Collection Time: 04/22/21  7:48 PM   Specimen: Nasopharyngeal Swab; Nasopharyngeal(NP) swabs in vial transport medium  Result Value Ref Range Status   SARS Coronavirus 2 by RT PCR NEGATIVE NEGATIVE Final    Comment: (NOTE) SARS-CoV-2 target nucleic acids are NOT DETECTED.  The SARS-CoV-2 RNA is generally detectable in upper respiratory specimens during the acute phase of infection. The lowest concentration of SARS-CoV-2 viral copies this assay can detect is 138 copies/mL. A negative result does not preclude SARS-Cov-2 infection and should not be used as the sole basis for treatment or other patient management decisions. A negative result may occur with  improper specimen collection/handling, submission of specimen other than nasopharyngeal swab, presence of viral mutation(s) within the areas targeted by this assay, and inadequate number of viral copies(<138 copies/mL). A negative result must be combined with clinical observations, patient history, and epidemiological information. The expected result is Negative.  Fact Sheet for Patients:  EntrepreneurPulse.com.au  Fact Sheet for Healthcare Providers:  IncredibleEmployment.be  This test is no t yet approved or cleared by the [place] FDA and  has been authorized for detection and/or diagnosis of SARS-CoV-2 by FDA under an Emergency Use Authorization (EUA). This EUA will remain  in effect (meaning this test  can be used) for the duration of the COVID-19 declaration under Section 564(b)(1) of the Act, 21 U.S.C.section 360bbb-3(b)(1), unless the authorization is terminated  or revoked sooner.       Influenza A by PCR NEGATIVE NEGATIVE Final   Influenza B by PCR NEGATIVE NEGATIVE Final    Comment: (NOTE) The Xpert Xpress SARS-CoV-2/FLU/RSV plus assay is intended as an aid in the diagnosis of influenza from Nasopharyngeal swab specimens and should not be used as a sole basis for treatment. Nasal washings and aspirates are unacceptable for Xpert Xpress SARS-CoV-2/FLU/RSV testing.  Fact Sheet for Patients: EntrepreneurPulse.com.au  Fact Sheet for Healthcare Providers: IncredibleEmployment.be  This test is not yet approved or cleared by the [place] FDA and has been authorized for detection and/or diagnosis of SARS-CoV-2 by FDA under an Emergency Use Authorization (EUA). This EUA will remain in effect (meaning this test can be used) for the duration  of the COVID-19 declaration under Section 564(b)(1) of the Act, 21 U.S.C. section 360bbb-3(b)(1), unless the authorization is terminated or revoked.  Performed at [facility], [address] [place], [place] [address]   Surgical pcr screen     Status: None   Collection Time: 04/23/21 10:27 AM   Specimen: Nasal Mucosa; Nasal Swab  Result Value Ref Range Status   MRSA, PCR NEGATIVE NEGATIVE Final   Staphylococcus aureus NEGATIVE NEGATIVE Final    Comment: (NOTE) The Xpert SA Assay (FDA approved for NASAL specimens in patients 16 years of age and older), is one component of a comprehensive surveillance program. It is not intended to diagnose infection nor to guide or monitor treatment. Performed at [facility], [address], [place], [place] [address]       Radiology Studies: No results found.   Scheduled Meds:  citalopram  40 mg Oral Daily   docusate sodium  100 mg  Oral BID   enoxaparin (LOVENOX) injection  40 mg Subcutaneous Q24H   levothyroxine  100 mcg Oral Q0600   pantoprazole  40 mg Oral Daily   Continuous Infusions:  methocarbamol (ROBAXIN) IV       LOS: 4 days     Enzo Bi, MD [facility] If 7PM-7AM, please contact night-coverage 04/26/2021, 6:24 PM

## 2021-04-26 NOTE — Progress Notes (Signed)
   Subjective: 3 Days Post-Op Procedure(s) (LRB): TOTAL HIP ARTHROPLASTY ANTERIOR APPROACH (Right) Patient reports pain as moderate.   Reports some low back pain, denies any symptoms radiating into the legs. Patient is well, and has had no acute complaints or problems Denies any CP, SOB, ABD pain. We will continue therapy today.  Plan is to go Skilled nursing facility after hospital stay.  Objective: Vital signs in last 24 hours: Temp:  [97.9 F (36.6 C)-99.6 F (37.6 C)] 97.9 F (36.6 C) (08/07 0721) Pulse Rate:  [80-84] 82 (08/07 0721) Resp:  [14-20] 16 (08/07 0721) BP: (101-118)/(55-66) 118/66 (08/07 0721) SpO2:  [91 %-98 %] 96 % (08/07 0721) Weight:  [63.2 kg] 63.2 kg (08/07 0456)  Intake/Output from previous day: 08/06 0701 - 08/07 0700 In: -  Out: 1300 [Urine:1300] Intake/Output this shift: No intake/output data recorded.  Recent Labs    04/24/21 0508 04/25/21 0538 04/26/21 0458  HGB 10.7* 9.4* 9.5*   Recent Labs    04/25/21 0538 04/26/21 0458  WBC 6.8 6.4  RBC 2.89* 2.89*  HCT 28.4* 28.6*  PLT 171 189   Recent Labs    04/25/21 0538 04/26/21 0458  NA 139 138  K 3.7 3.9  CL 104 104  CO2 28 29  BUN 13 12  CREATININE 0.34* 0.34*  GLUCOSE 95 98  CALCIUM 7.9* 7.9*   No results for input(s): LABPT, INR in the last 72 hours.  EXAM General - Patient is Alert, Appropriate, and Oriented Extremity - Neurovascular intact Sensation intact distally Intact pulses distally Dorsiflexion/Plantar flexion intact Right thigh is soft to palpation. Dressing - dressing C/D/I and no drainage Motor Function - intact, moving foot and toes well on exam.  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    Malignant hyperthermia    Toxic multinodular goiter    Assessment/Plan:   3 Days Post-Op Procedure(s) (LRB): TOTAL HIP ARTHROPLASTY ANTERIOR APPROACH (Right) Principal Problem:   Closed right hip fracture (HCC) Active Problems:   Anxiety   Fall at home, initial encounter   Tobacco abuse   Acquired hypothyroidism   Right hip pain  Estimated body mass index is 25.48 kg/m as calculated from the following:   Height as of this encounter: '5\' 2"'$  (1.575 m).   Weight as of this encounter: 63.2 kg. Advance diet Up with therapy  Labs reviewed this AM, WBC 6.4, Hg 9.5. Patient has not had a BM yet, is passing gas.  Move to FLEET enema today. Heating pad for the patient's back. Plan for discharge to SNF, possibly tomorrow.  Follow-up with [facility] orthopedics in 2 weeks Remove Praveena and apply honeycomb dressing on 05/03/2021 Lovenox 40 mg subcu daily x14 days at discharge TED hose bilateral lower extremity x6 weeks  DVT Prophylaxis - Lovenox, Foot Pumps, and TED hose Weight-Bearing as tolerated to right leg  J. Cameron Proud, PA-C [place] 04/26/2021, 8:56 AM

## 2021-04-26 NOTE — Progress Notes (Signed)
Physical Therapy Treatment Patient Details Name: Tara Scott MRN: [id number] DOB: [birth year] Today's Date: 04/26/2021    History of Present Illness Tara Scott is a 60 y.o. female with medical history significant for acquired hypothyroidism, chronic tobacco abuse, generalized anxiety disorder, who is admitted to [facility] on 04/22/2021 with acute right femoral neck fracture after presenting from home to [facility] ED complaining of acute right hip pain following ground-level fall. History of closed fracture of spine at C1-C4 level with central cervical cord lesion form of MD.    PT Comments    OOB with mod assist but better assist today from pt.  Steady in sitting after a few moments of dizziness that passes.  Stood x 2 with max a x 1 with cues for hand placement and posture.  Wide BOS.  She is able to take a few unsteady steps to chair with mod a x 1.  Would recommend +2 assist for further gait attempts and transfers with nursing staff.  Participated in exercises as described below.     Follow Up Recommendations  SNF     Equipment Recommendations  Rolling walker with 5" wheels    Recommendations for Other Services       Precautions / Restrictions Precautions Precautions: Anterior Hip;Fall Precaution Booklet Issued: Yes (comment) Precaution Comments: direct anterior hip Restrictions Weight Bearing Restrictions: Yes RLE Weight Bearing: Weight bearing as tolerated    Mobility  Bed Mobility Overal bed mobility: Needs Assistance Bed Mobility: Supine to Sit   Sidelying to sit: Mod assist            Transfers Overall transfer level: Needs assistance Equipment used: Rolling walker (2 wheeled) Transfers: Sit to/from Stand Sit to Stand: Mod assist;Max assist;From elevated surface         General transfer comment: continues to require increased assist and cues for hand placement and obtaining balance before attempting to  step  Ambulation/Gait Ambulation/Gait assistance: Mod assist Gait Distance (Feet): 3 Feet Assistive device: Rolling walker (2 wheeled) Gait Pattern/deviations: Step-to pattern;Decreased step length - right;Decreased step length - left Gait velocity: decr   General Gait Details: pt wa able to take several antalgic steps from recliner to EOB   Stairs             Wheelchair Mobility    Modified Rankin (Stroke Patients Only)       Balance Overall balance assessment: Needs assistance Sitting-balance support: Feet supported Sitting balance-Leahy Scale: Good Sitting balance - Comments: able to maintain sitting balance with supervision   Standing balance support: Bilateral upper extremity supported;During functional activity Standing balance-Leahy Scale: Poor Standing balance comment: needs constant assistance for safety in standing                            Cognition Arousal/Alertness: Awake/alert Behavior During Therapy: WFL for tasks assessed/performed Overall Cognitive Status: Within Functional Limits for tasks assessed                                 General Comments: seemed a bit more comfortable today with mobility.      Exercises Total Joint Exercises Ankle Circles/Pumps: AROM;Both;10 reps Quad Sets: AROM;10 reps Gluteal Sets: AROM;10 reps Towel Squeeze: AROM;10 reps Heel Slides: AROM;10 reps (limited ROM) Hip ABduction/ADduction: AAROM;Right;10 reps Straight Leg Raises: AAROM;10 reps    General Comments  Pertinent Vitals/Pain Pain Assessment: Faces Faces Pain Scale: Hurts even more Pain Location: R hip/thigh Pain Descriptors / Indicators: Discomfort;Sore;Grimacing;Guarding;Moaning Pain Intervention(s): Limited activity within patient's tolerance;Monitored during session;Premedicated before session;Repositioned    Home Living                      Prior Function            PT Goals (current goals can  now be found in the care plan section) Progress towards PT goals: Progressing toward goals    Frequency    BID      PT Plan Current plan remains appropriate    Co-evaluation              AM-PAC PT "6 Clicks" Mobility   Outcome Measure  Help needed turning from your back to your side while in a flat bed without using bedrails?: A Lot Help needed moving from lying on your back to sitting on the side of a flat bed without using bedrails?: A Lot Help needed moving to and from a bed to a chair (including a wheelchair)?: A Lot Help needed standing up from a chair using your arms (e.g., wheelchair or bedside chair)?: A Lot Help needed to walk in hospital room?: A Lot Help needed climbing 3-5 steps with a railing? : Total 6 Click Score: 11    End of Session Equipment Utilized During Treatment: Gait belt Activity Tolerance: Patient limited by pain Patient left: in bed;with call bell/phone within reach;with bed alarm set;with family/visitor present Nurse Communication: Mobility status PT Visit Diagnosis: Unsteadiness on feet (R26.81);Other abnormalities of gait and mobility (R26.89);Muscle weakness (generalized) (M62.81);Difficulty in walking, not elsewhere classified (R26.2);History of falling (Z91.81);Pain Pain - Right/Left: Right Pain - part of body: Leg     Time: [id number] PT Time Calculation (min) (ACUTE ONLY): 25 min  Charges:  $Therapeutic Exercise: 8-22 mins $Therapeutic Activity: 8-22 mins                    Chesley Noon, PTA 04/26/21, 10:32 AM , 10:31 AM

## 2021-04-27 DIAGNOSIS — R2689 Other abnormalities of gait and mobility: Secondary | ICD-10-CM | POA: Diagnosis not present

## 2021-04-27 DIAGNOSIS — E039 Hypothyroidism, unspecified: Secondary | ICD-10-CM | POA: Diagnosis not present

## 2021-04-27 DIAGNOSIS — K219 Gastro-esophageal reflux disease without esophagitis: Secondary | ICD-10-CM | POA: Diagnosis not present

## 2021-04-27 DIAGNOSIS — R110 Nausea: Secondary | ICD-10-CM | POA: Diagnosis not present

## 2021-04-27 DIAGNOSIS — M6281 Muscle weakness (generalized): Secondary | ICD-10-CM | POA: Diagnosis not present

## 2021-04-27 DIAGNOSIS — Z96652 Presence of left artificial knee joint: Secondary | ICD-10-CM | POA: Diagnosis not present

## 2021-04-27 DIAGNOSIS — M6259 Muscle wasting and atrophy, not elsewhere classified, multiple sites: Secondary | ICD-10-CM | POA: Diagnosis not present

## 2021-04-27 DIAGNOSIS — S72001A Fracture of unspecified part of neck of right femur, initial encounter for closed fracture: Secondary | ICD-10-CM | POA: Diagnosis not present

## 2021-04-27 DIAGNOSIS — G7100 Muscular dystrophy, unspecified: Secondary | ICD-10-CM | POA: Diagnosis not present

## 2021-04-27 DIAGNOSIS — R279 Unspecified lack of coordination: Secondary | ICD-10-CM | POA: Diagnosis not present

## 2021-04-27 DIAGNOSIS — E559 Vitamin D deficiency, unspecified: Secondary | ICD-10-CM | POA: Diagnosis not present

## 2021-04-27 DIAGNOSIS — W19XXXD Unspecified fall, subsequent encounter: Secondary | ICD-10-CM | POA: Diagnosis not present

## 2021-04-27 DIAGNOSIS — L309 Dermatitis, unspecified: Secondary | ICD-10-CM | POA: Diagnosis not present

## 2021-04-27 DIAGNOSIS — F32A Depression, unspecified: Secondary | ICD-10-CM | POA: Diagnosis not present

## 2021-04-27 DIAGNOSIS — S7292XA Unspecified fracture of left femur, initial encounter for closed fracture: Secondary | ICD-10-CM | POA: Diagnosis not present

## 2021-04-27 DIAGNOSIS — W19XXXA Unspecified fall, initial encounter: Secondary | ICD-10-CM | POA: Diagnosis not present

## 2021-04-27 DIAGNOSIS — S7291XA Unspecified fracture of right femur, initial encounter for closed fracture: Secondary | ICD-10-CM | POA: Diagnosis not present

## 2021-04-27 DIAGNOSIS — R41841 Cognitive communication deficit: Secondary | ICD-10-CM | POA: Diagnosis not present

## 2021-04-27 DIAGNOSIS — S7292XB Unspecified fracture of left femur, initial encounter for open fracture type I or II: Secondary | ICD-10-CM | POA: Diagnosis not present

## 2021-04-27 DIAGNOSIS — Z743 Need for continuous supervision: Secondary | ICD-10-CM | POA: Diagnosis not present

## 2021-04-27 DIAGNOSIS — S7292XD Unspecified fracture of left femur, subsequent encounter for closed fracture with routine healing: Secondary | ICD-10-CM | POA: Diagnosis not present

## 2021-04-27 DIAGNOSIS — I959 Hypotension, unspecified: Secondary | ICD-10-CM | POA: Diagnosis not present

## 2021-04-27 DIAGNOSIS — Z471 Aftercare following joint replacement surgery: Secondary | ICD-10-CM | POA: Diagnosis not present

## 2021-04-27 DIAGNOSIS — M25551 Pain in right hip: Secondary | ICD-10-CM | POA: Diagnosis not present

## 2021-04-27 DIAGNOSIS — R1319 Other dysphagia: Secondary | ICD-10-CM | POA: Diagnosis not present

## 2021-04-27 LAB — MAGNESIUM: Magnesium: 2 mg/dL (ref 1.7–2.4)

## 2021-04-27 LAB — BASIC METABOLIC PANEL
Anion gap: 7 (ref 5–15)
BUN: 9 mg/dL (ref 6–20)
CO2: 29 mmol/L (ref 22–32)
Calcium: 8 mg/dL — ABNORMAL LOW (ref 8.9–10.3)
Chloride: 103 mmol/L (ref 98–111)
Creatinine, Ser: 0.35 mg/dL — ABNORMAL LOW (ref 0.44–1.00)
GFR, Estimated: >60 mL/min (ref >60)
Glucose, Bld: 105 mg/dL — ABNORMAL HIGH (ref 70–99)
Potassium: 3.9 mmol/L (ref 3.5–5.1)
Sodium: 139 mmol/L (ref 135–145)

## 2021-04-27 LAB — CBC
HCT: 26.8 % — ABNORMAL LOW (ref 36.0–46.0)
Hemoglobin: 8.9 g/dL — ABNORMAL LOW (ref 12.0–15.0)
MCH: 32.2 pg (ref 26.0–34.0)
MCHC: 33.2 g/dL (ref 30.0–36.0)
MCV: 97.1 fL (ref 80.0–100.0)
Platelets: 184 10*3/uL (ref 150–400)
RBC: 2.76 MIL/uL — ABNORMAL LOW (ref 3.87–5.11)
RDW: 13 % (ref 11.5–15.5)
WBC: 5.3 10*3/uL (ref 4.0–10.5)
nRBC: 0 % (ref 0.0–0.2)

## 2021-04-27 LAB — RESP PANEL BY RT-PCR (FLU A&B, COVID) ARPGX2
Influenza A by PCR: NEGATIVE
Influenza B by PCR: NEGATIVE
SARS Coronavirus 2 by RT PCR: NEGATIVE

## 2021-04-27 LAB — SURGICAL PATHOLOGY

## 2021-04-27 MED ORDER — ENOXAPARIN SODIUM 40 MG/0.4ML IJ SOSY: 40.0000 mg | PREFILLED_SYRINGE | INTRAMUSCULAR | 0 refills | Status: DC

## 2021-04-27 NOTE — Plan of Care (Signed)

## 2021-04-27 NOTE — Progress Notes (Addendum)
Physical Therapy Treatment Patient Details Name: Tara Scott MRN: [id number] DOB: 11-08-1960 Today's Date: 04/27/2021    History of Present Illness Tara Scott is a 60 y.o. female with medical history significant for acquired hypothyroidism, chronic tobacco abuse, generalized anxiety disorder, who is admitted to [facility] on 04/22/2021 with acute right femoral neck fracture after presenting from home to [facility] ED complaining of acute right hip pain following ground-level fall. History of closed fracture of spine at C1-C4 level with central cervical cord lesion form of MD.    PT Comments    Pt does better getting to EOB today.  Still struggles but only requires min a x 1 primarily for scooting hips.  Steady in sitting.  She is able to stand with min a x 2 to walker and after some standing AROM turn to chair.  She remains standing and is able to walk forward and backwards x 2 trials for a total of 10'.  +2 assist for general safety to allow for gait today.  Participated in exercises as described below.  Pain continues to be limiting factor for progression of gait. Pt also demonstrates decreased safety and poor hand placements.  Often will let go of walker and grab staffs arms for support if she feels unbalanced.  Cues to keep hands on walker.      Follow Up Recommendations  SNF     Equipment Recommendations  Rolling walker with 5" wheels    Recommendations for Other Services       Precautions / Restrictions Precautions Precautions: Anterior Hip;Fall Precaution Booklet Issued: Yes (comment) Precaution Comments: direct anterior hip Restrictions Weight Bearing Restrictions: Yes RLE Weight Bearing: Weight bearing as tolerated    Mobility  Bed Mobility Overal bed mobility: Needs Assistance Bed Mobility: Supine to Sit   Sidelying to sit: Min assist            Transfers Overall transfer level: Needs assistance Equipment used: Rolling walker (2  wheeled) Transfers: Sit to/from Stand Sit to Stand: Mod assist;+2 physical assistance;Min assist            Ambulation/Gait Ambulation/Gait assistance: Min assist;+2 physical assistance Gait Distance (Feet): 10 Feet Assistive device: Rolling walker (2 wheeled) Gait Pattern/deviations: Step-to pattern;Decreased step length - right;Decreased step length - left Gait velocity: decr   General Gait Details: forward and backwards from recliner x 2   Stairs             Wheelchair Mobility    Modified Rankin (Stroke Patients Only)       Balance Overall balance assessment: Needs assistance Sitting-balance support: Feet supported Sitting balance-Leahy Scale: Good Sitting balance - Comments: able to maintain sitting balance with supervision   Standing balance support: Bilateral upper extremity supported;During functional activity Standing balance-Leahy Scale: Poor Standing balance comment: needs constant assistance for safety in standing                            Cognition Arousal/Alertness: Awake/alert Behavior During Therapy: WFL for tasks assessed/performed Overall Cognitive Status: Within Functional Limits for tasks assessed                                        Exercises Total Joint Exercises Ankle Circles/Pumps: AROM;Both;10 reps Quad Sets: AROM;10 reps Gluteal Sets: AROM;10 reps Towel Squeeze: AROM;10 reps Heel Slides: AROM;10 reps (limited  ROM) Hip ABduction/ADduction: AAROM;Right;10 reps Straight Leg Raises: AAROM;10 reps    General Comments        Pertinent Vitals/Pain Pain Assessment: Faces Faces Pain Scale: Hurts even more Pain Location: R hip/thigh Pain Descriptors / Indicators: Discomfort;Sore;Grimacing;Guarding;Moaning Pain Intervention(s): Limited activity within patient's tolerance;Monitored during session;Premedicated before session;Repositioned    Home Living                      Prior Function             PT Goals (current goals can now be found in the care plan section) Progress towards PT goals: Progressing toward goals    Frequency    BID      PT Plan Current plan remains appropriate    Co-evaluation              AM-PAC PT "6 Clicks" Mobility   Outcome Measure  Help needed turning from your back to your side while in a flat bed without using bedrails?: A Lot Help needed moving from lying on your back to sitting on the side of a flat bed without using bedrails?: A Lot Help needed moving to and from a bed to a chair (including a wheelchair)?: A Lot Help needed standing up from a chair using your arms (e.g., wheelchair or bedside chair)?: A Lot Help needed to walk in hospital room?: A Lot Help needed climbing 3-5 steps with a railing? : Total 6 Click Score: 11    End of Session Equipment Utilized During Treatment: Gait belt Activity Tolerance: Patient limited by pain Patient left: in bed;with call bell/phone within reach;with bed alarm set;with family/visitor present Nurse Communication: Mobility status PT Visit Diagnosis: Unsteadiness on feet (R26.81);Other abnormalities of gait and mobility (R26.89);Muscle weakness (generalized) (M62.81);Difficulty in walking, not elsewhere classified (R26.2);History of falling (Z91.81);Pain Pain - Right/Left: Right Pain - part of body: Leg     Time: [id number] PT Time Calculation (min) (ACUTE ONLY): 18 min  Charges:  $Gait Training: 8-22 mins          Chesley Noon, PTA 04/27/21, 9:23 AM , 9:21 AM

## 2021-04-27 NOTE — TOC Progression Note (Signed)
Transition of Care (TOC) - Progression Note    Patient Details  Name: Tara Scott MRN: [id number] Date of Birth: 10/24/1960  Transition of Care (TOC) CM/SW Contact  Deliliah J Gregory, RN Phone Number: 04/27/2021, 10:08 AM  Clinical Narrative:     Met with the patient to review the bed offers and star ratings, she stated that she will accept the [facility] bed offer, I notified Ricky at [facility], I called [facility] and notified of the bed choice. Auth approved for 5 days auth number [id number], [facility] auth number [id number]       Expected Discharge Plan and Services                                                 Social Determinants of Health (SDOH) Interventions    Readmission Risk Interventions No flowsheet data found.  

## 2021-04-27 NOTE — Progress Notes (Signed)
   Subjective: 4 Days Post-Op Procedure(s) (LRB): TOTAL HIP ARTHROPLASTY ANTERIOR APPROACH (Right) Patient reports pain as mild.   Patient is well, and has had no acute complaints or problems Denies any CP, SOB, ABD pain. We will continue therapy today.  Plan is to go Home after hospital stay.  Objective: Vital signs in last 24 hours: Temp:  [97.1 F (36.2 C)-100.2 F (37.9 C)] 98.1 F (36.7 C) (08/08 0757) Pulse Rate:  [73-89] 73 (08/08 0757) Resp:  [16-20] 18 (08/08 0757) BP: (102-123)/(46-74) 106/46 (08/08 0757) SpO2:  [91 %-98 %] 91 % (08/08 0757)  Intake/Output from previous day: 08/07 0701 - 08/08 0700 In: -  Out: 850 [Urine:850] Intake/Output this shift: No intake/output data recorded.  Recent Labs    04/25/21 0538 04/26/21 0458 04/27/21 0431  HGB 9.4* 9.5* 8.9*   Recent Labs    04/26/21 0458 04/27/21 0431  WBC 6.4 5.3  RBC 2.89* 2.76*  HCT 28.6* 26.8*  PLT 189 184   Recent Labs    04/26/21 0458 04/27/21 0431  NA 138 139  K 3.9 3.9  CL 104 103  CO2 29 29  BUN 12 9  CREATININE 0.34* 0.35*  GLUCOSE 98 105*  CALCIUM 7.9* 8.0*   No results for input(s): LABPT, INR in the last 72 hours.   EXAM General - Patient is Alert, Appropriate, and Oriented Extremity - Neurovascular intact Sensation intact distally Intact pulses distally Dorsiflexion/Plantar flexion intact Dressing - dressing C/D/I and no drainage Motor Function - intact, moving foot and toes well on exam.  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    Malignant hyperthermia    Toxic multinodular goiter      Assessment/Plan:   4 Days Post-Op Procedure(s) (LRB): TOTAL HIP ARTHROPLASTY ANTERIOR APPROACH (Right) Principal Problem:   Closed right hip fracture (HCC) Active Problems:   Anxiety   Fall at home, initial encounter   Tobacco abuse   Acquired hypothyroidism   Right hip pain  Estimated body mass index is 25.48 kg/m as calculated from the following:   Height as of this encounter: '5\' 2"'$  (1.575 m).   Weight as of this encounter: 63.2 kg. Advance diet Up with therapy Vital signs are stable Labs are stable Pain controlled Care management to assist with discharge to skilled nursing facility   Follow-up with [facility] orthopedics in 2 weeks Remove Praveena and apply honeycomb dressing on 05/03/2021 Lovenox 40 mg subcu daily x14 days at discharge TED hose bilateral lower extremity x6 weeks    DVT Prophylaxis - Lovenox, Foot Pumps, and TED hose Weight-Bearing as tolerated to right leg   T. Rachelle Hora, PA-C [facility] 04/27/2021, 8:19 AM

## 2021-04-27 NOTE — Progress Notes (Signed)
Pt discharged to [facility], all instructions provided to Dala Dock with questions answered.   BP 120/72 (BP Location: Right Arm)   Pulse 79   Temp 99.5 F (37.5 C)   Resp 16   Ht '5\' 2"'$  (1.575 m)   Wt 63.2 kg Comment: bed  SpO2 97%   BMI 25.48 kg/m

## 2021-04-27 NOTE — Plan of Care (Signed)
  Problem: Education: Goal: Knowledge of General Education information will improve Description: Including pain rating scale, medication(s)/side effects and non-pharmacologic comfort measures 04/27/2021 1421 by Jules Schick, RN Outcome: Completed/Met 04/27/2021 1017 by Jules Schick, RN Outcome: Progressing   Problem: Pain Managment: Goal: General experience of comfort will improve 04/27/2021 1421 by Jules Schick, RN Outcome: Completed/Met 04/27/2021 1017 by Jules Schick, RN Outcome: Progressing   Problem: Safety: Goal: Ability to remain free from injury will improve 04/27/2021 1421 by Jules Schick, RN Outcome: Completed/Met 04/27/2021 1017 by Jules Schick, RN Outcome: Progressing   Problem: Skin Integrity: Goal: Risk for impaired skin integrity will decrease 04/27/2021 1421 by Jules Schick, RN Outcome: Completed/Met 04/27/2021 1017 by Jules Schick, RN Outcome: Progressing

## 2021-04-27 NOTE — TOC Progression Note (Signed)
Transition of Care [facility]) - Progression Note    Patient Details  Name: Tara Scott MRN: [id number] Date of Birth: [birth year]  Transition of Care [facility]) CM/SW [place], RN Phone Number: 04/27/2021, 1:12 PM  Clinical Narrative:     The patient to go to room E10, Nurse to call report, [facility] approved auth number [id number], Called [facility] for transport       Expected Discharge Plan and Services           Expected Discharge Date: 04/27/21                                     Social Determinants of Health (SDOH) Interventions    Readmission Risk Interventions No flowsheet data found.

## 2021-04-27 NOTE — Discharge Summary (Signed)
Physician Discharge Summary   Tara Scott  female DOB: Feb 03, 1961  [id number]  PCP: Virginia Crews, MD  Admit date: 04/22/2021 Discharge date: 04/27/2021  Admitted From: home Disposition:  SNF CODE STATUS: Full code  Discharge Instructions     Discharge instructions   Complete by: As directed    Follow-up with [facility] orthopedics in 2 weeks Remove Praveena and apply honeycomb dressing on 05/03/2021 Lovenox 40 mg subcu daily x14 days at discharge TED hose bilateral lower extremity x6 weeks Weight-Bearing as tolerated to right leg - -   No wound care   Complete by: As directed         Hospital Course:  For full details, please see H&P, progress notes, consult notes and ancillary notes.  Briefly,  Tara Scott is a 60 y.o. female with medical history significant for acquired hypothyroidism, chronic tobacco abuse, generalized anxiety disorder, who was admitted to [facility] on 04/22/2021 with acute right femoral neck fracture after presenting from home to [facility] ED complaining of acute right hip pain following ground-level fall.    #) Acute right femoral neck fracture: S/p TOTAL HIP ARTHROPLASTY on 04/23/21 --Oxycodone PRN for pain. --Follow-up with [facility] orthopedics in 2 weeks --Remove Praveena and apply honeycomb dressing on 05/03/2021 --Lovenox 40 mg subcu daily x14 days at discharge --TED hose bilateral lower extremity x6 weeks --Weight-Bearing as tolerated to right leg    #) Generalized anxiety disorder: --cont home Celexa    #) Acquired hypothyroidism: --cont Synthroid    #) Chronic tobacco abuse: smoked 0.5 packs/day over the last 40 years. --smoking cessation advised   # N/V, resolved --likely from opioids --supportive care   Discharge Diagnoses:  Principal Problem:   Closed right hip fracture [facility]) Active Problems:   Anxiety   Fall at home, initial encounter   Tobacco abuse   Acquired hypothyroidism   Right hip pain   30  Day Unplanned Readmission Risk Score    Flowsheet Row ED to Hosp-Admission (Current) from 04/22/2021 in [place] (1A)  30 Day Unplanned Readmission Risk Score (%) 9.04 Filed at 04/27/2021 0801       This score is the patient's risk of an unplanned readmission within 30 days of being discharged (0 -100%). The score is based on dignosis, age, lab data, medications, orders, and past utilization.   Low:  0-14.9   Medium: 15-21.9   High: 22-29.9   Extreme: 30 and above         Discharge Instructions:  Allergies as of 04/27/2021       Reactions   Anesthetics, Halogenated Other (See Comments)   Other reaction(s): Other (See Comments)   Ciprofloxacin    swelling   Methimazole    hives   Oxycodone-acetaminophen Nausea And Vomiting   Penicillins Other (See Comments)   RXN AS A CHILD        Medication List     STOP taking these medications    busPIRone 7.5 MG tablet Commonly known as: BUSPAR   famotidine 20 MG tablet Commonly known as: Pepcid   meloxicam 7.5 MG tablet Commonly known as: MOBIC       TAKE these medications    cholecalciferol 25 MCG (1000 UNIT) tablet Commonly known as: VITAMIN D3 Take 1,000 Units by mouth daily.   citalopram 40 MG tablet Commonly known as: CELEXA TAKE 1 TABLET BY MOUTH EVERY DAY FOR DEPRESSION   docusate sodium 100 MG capsule Commonly known as: COLACE  Take 1 capsule (100 mg total) by mouth 2 (two) times daily.   enoxaparin 40 MG/0.4ML injection Commonly known as: LOVENOX Inject 0.4 mLs (40 mg total) into the skin daily for 14 days.   levothyroxine 100 MCG tablet Commonly known as: SYNTHROID TAKE 1 TABLET BY MOUTH EVERY DAY BEFORE BREAKFAST   methocarbamol 500 MG tablet Commonly known as: ROBAXIN Take 1 tablet (500 mg total) by mouth every 6 (six) hours as needed for muscle spasms.   oxyCODONE 5 MG immediate release tablet Commonly known as: Oxy IR/ROXICODONE Take 1-2 tablets (5-10 mg  total) by mouth every 4 (four) hours as needed for moderate pain (pain score 4-6).               Durable Medical Equipment  (From admission, onward)           Start     Ordered   04/23/21 1936  DME Walker rolling  Once       Question Answer Comment  Walker: With 5 Inch Wheels   Patient needs a walker to treat with the following condition Status post total hip replacement, right      04/23/21 1935   04/23/21 1936  DME 3 n 1  Once        04/23/21 1935   04/23/21 1936  DME Bedside commode  Once       Question:  Patient needs a bedside commode to treat with the following condition  Answer:  Status post total hip replacement, right   04/23/21 1935             Follow-up Information     Duanne Guess, PA-C Follow up in 2 week(s).   Specialties: Orthopedic Surgery, Emergency Medicine Contact information: [place] [address] [phone]         Virginia Crews, MD Follow up in 1 week(s).   Specialty: Family Medicine Contact information: [address] [place] [address] [phone]                 Allergies  Allergen Reactions   Anesthetics, Halogenated Other (See Comments)    Other reaction(s): Other (See Comments)   Ciprofloxacin     swelling   Methimazole     hives   Oxycodone-Acetaminophen Nausea And Vomiting   Penicillins Other (See Comments)    RXN AS A CHILD     The results of significant diagnostics from this hospitalization (including imaging, microbiology, ancillary and laboratory) are listed below for reference.   Consultations:   Procedures/Studies: DG Knee 2 Views Right  Result Date: 04/22/2021 CLINICAL DATA:  Hip fracture EXAM: RIGHT KNEE - 1-2 VIEW COMPARISON:  None. FINDINGS: No evidence of fracture, dislocation, or joint effusion. No evidence of arthropathy or other focal bone abnormality. Soft tissues are unremarkable. IMPRESSION: Negative. Electronically Signed   By: Donavan Foil M.D.   On: 04/22/2021 20:39   DG Chest Port 1 View  Result Date: 04/22/2021 CLINICAL DATA:  Fall.  Right hip fracture EXAM: PORTABLE CHEST 1 VIEW COMPARISON:  10/20/2012 FINDINGS: The heart size and mediastinal contours are within normal limits. Both lungs are clear. The visualized skeletal structures are unremarkable. IMPRESSION: No active disease. Electronically Signed   By: Franchot Gallo M.D.   On: 04/22/2021 20:02   DG HIP OPERATIVE UNILAT W OR W/O PELVIS RIGHT  Result Date: 04/23/2021 CLINICAL DATA:  Right hip arthroplasty EXAM: OPERATIVE RIGHT HIP (WITH PELVIS IF PERFORMED) 2 VIEWS TECHNIQUE:  Fluoroscopic spot image(s) were submitted for interpretation post-operatively. COMPARISON:  None. FINDINGS: There are 2 fluoroscopic clips obtained before and after right total arthroplasty. IMPRESSION: Intraoperative fluoroscopy Electronically Signed   By: Ulyses Jarred M.D.   On: 04/23/2021 19:11   DG HIP UNILAT W OR W/O PELVIS 2-3 VIEWS RIGHT  Result Date: 04/23/2021 CLINICAL DATA:  Total hip arthroplasty EXAM: DG HIP (WITH OR WITHOUT PELVIS) 2-3V RIGHT COMPARISON:  04/22/2021 FINDINGS: Status post right total hip arthroplasty with expected postsurgical findings including drain and skin staples. No periprosthetic abnormality. IMPRESSION: Expected postsurgical findings of right total hip arthroplasty. Electronically Signed   By: Ulyses Jarred M.D.   On: 04/23/2021 19:11   DG Hip Unilat W or Wo Pelvis 2-3 Views Right  Result Date: 04/22/2021 CLINICAL DATA:  Fall. EXAM: DG HIP (WITH OR WITHOUT PELVIS) 2-3V RIGHT COMPARISON:  11/29/2019 FINDINGS: Fracture right femoral neck with mild impaction angulation. Right hip joint space normal. No other fracture. IMPRESSION: Angulated right femoral neck fracture. Electronically Signed   By: Franchot Gallo M.D.   On: 04/22/2021 20:02   DG Femur Min 2 Views Right  Result Date: 04/22/2021 CLINICAL DATA:  Hip fracture EXAM: RIGHT FEMUR 2 VIEWS COMPARISON:  Hip  radiograph 04/22/2021 FINDINGS: Acute mildly displaced right femoral neck fracture. No femoral head dislocation. IMPRESSION: Acute right femoral neck fracture Electronically Signed   By: Donavan Foil M.D.   On: 04/22/2021 20:39      Labs: BNP (last 3 results) No results for input(s): BNP in the last 8760 hours. Basic Metabolic Panel: Recent Labs  Lab 04/23/21 0419 04/24/21 0508 04/25/21 0538 04/26/21 0458 04/27/21 0431  NA 139 137 139 138 139  K 4.5 3.8 3.7 3.9 3.9  CL 102 105 104 104 103  CO2 '31 28 28 29 29  '$ GLUCOSE 162* 133* 95 98 105*  BUN '20 14 13 12 9  '$ CREATININE 0.41* <0.30* 0.34* 0.34* 0.35*  CALCIUM 8.9 8.3* 7.9* 7.9* 8.0*  MG 2.2 2.0 1.9 2.0 2.0   Liver Function Tests: Recent Labs  Lab 04/22/21 1808 04/23/21 0419  AST 27 41  ALT 21 27  ALKPHOS 59 66  BILITOT 0.6 0.5  PROT 7.1 7.8  ALBUMIN 4.0 4.1   No results for input(s): LIPASE, AMYLASE in the last 168 hours. No results for input(s): AMMONIA in the last 168 hours. CBC: Recent Labs  Lab 04/22/21 1808 04/23/21 0419 04/24/21 0508 04/25/21 0538 04/26/21 0458 04/27/21 0431  WBC 9.0 11.3* 11.2* 6.8 6.4 5.3  NEUTROABS 6.0 10.2*  --   --   --   --   HGB 13.0 13.4 10.7* 9.4* 9.5* 8.9*  HCT 37.2 40.6 32.1* 28.4* 28.6* 26.8*  MCV 96.6 98.5 99.1 98.3 99.0 97.1  PLT 265 251 170 171 189 184   Cardiac Enzymes: No results for input(s): CKTOTAL, CKMB, CKMBINDEX, TROPONINI in the last 168 hours. BNP: Invalid input(s): POCBNP CBG: Recent Labs  Lab 04/23/21 0751  GLUCAP 148*   D-Dimer No results for input(s): DDIMER in the last 72 hours. Hgb A1c No results for input(s): HGBA1C in the last 72 hours. Lipid Profile No results for input(s): CHOL, HDL, LDLCALC, TRIG, CHOLHDL, LDLDIRECT in the last 72 hours. Thyroid function studies No results for input(s): TSH, T4TOTAL, T3FREE, THYROIDAB in the last 72 hours.  Invalid input(s): FREET3 Anemia work up No results for input(s): VITAMINB12, FOLATE, FERRITIN,  TIBC, IRON, RETICCTPCT in the last 72 hours. Urinalysis    Component Value Date/Time  BILIRUBINUR negative 10/05/2017 1447   PROTEINUR negative 10/05/2017 1447   UROBILINOGEN 0.2 10/05/2017 1447   NITRITE negative 10/05/2017 1447   LEUKOCYTESUR Trace (A) 10/05/2017 1447   Sepsis Labs Invalid input(s): PROCALCITONIN,  WBC,  LACTICIDVEN Microbiology Recent Results (from the past 240 hour(s))  Resp Panel by RT-PCR (Flu A&B, Covid) Nasopharyngeal Swab     Status: None   Collection Time: 04/22/21  7:48 PM   Specimen: Nasopharyngeal Swab; Nasopharyngeal(NP) swabs in vial transport medium  Result Value Ref Range Status   SARS Coronavirus 2 by RT PCR NEGATIVE NEGATIVE Final    Comment: (NOTE) SARS-CoV-2 target nucleic acids are NOT DETECTED.  The SARS-CoV-2 RNA is generally detectable in upper respiratory specimens during the acute phase of infection. The lowest concentration of SARS-CoV-2 viral copies this assay can detect is 138 copies/mL. A negative result does not preclude SARS-Cov-2 infection and should not be used as the sole basis for treatment or other patient management decisions. A negative result may occur with  improper specimen collection/handling, submission of specimen other than nasopharyngeal swab, presence of viral mutation(s) within the areas targeted by this assay, and inadequate number of viral copies(<138 copies/mL). A negative result must be combined with clinical observations, patient history, and epidemiological information. The expected result is Negative.  Fact Sheet for Patients:  EntrepreneurPulse.com.au  Fact Sheet for Healthcare Providers:  IncredibleEmployment.be  This test is no t yet approved or cleared by the [place] FDA and  has been authorized for detection and/or diagnosis of SARS-CoV-2 by FDA under an Emergency Use Authorization (EUA). This EUA will remain  in effect (meaning this test can be used)  for the duration of the COVID-19 declaration under Section 564(b)(1) of the Act, 21 U.S.C.section 360bbb-3(b)(1), unless the authorization is terminated  or revoked sooner.       Influenza A by PCR NEGATIVE NEGATIVE Final   Influenza B by PCR NEGATIVE NEGATIVE Final    Comment: (NOTE) The Xpert Xpress SARS-CoV-2/FLU/RSV plus assay is intended as an aid in the diagnosis of influenza from Nasopharyngeal swab specimens and should not be used as a sole basis for treatment. Nasal washings and aspirates are unacceptable for Xpert Xpress SARS-CoV-2/FLU/RSV testing.  Fact Sheet for Patients: EntrepreneurPulse.com.au  Fact Sheet for Healthcare Providers: IncredibleEmployment.be  This test is not yet approved or cleared by the [place] FDA and has been authorized for detection and/or diagnosis of SARS-CoV-2 by FDA under an Emergency Use Authorization (EUA). This EUA will remain in effect (meaning this test can be used) for the duration of the COVID-19 declaration under Section 564(b)(1) of the Act, 21 U.S.C. section 360bbb-3(b)(1), unless the authorization is terminated or revoked.  Performed at [facility], [address], [place], [place] [address]   Surgical pcr screen     Status: None   Collection Time: 04/23/21 10:27 AM   Specimen: Nasal Mucosa; Nasal Swab  Result Value Ref Range Status   MRSA, PCR NEGATIVE NEGATIVE Final   Staphylococcus aureus NEGATIVE NEGATIVE Final    Comment: (NOTE) The Xpert SA Assay (FDA approved for NASAL specimens in patients 35 years of age and older), is one component of a comprehensive surveillance program. It is not intended to diagnose infection nor to guide or monitor treatment. Performed at [facility], [address] [place], [place] [address]      Total time spend on discharging this patient, including the last patient exam, discussing the hospital stay, instructions for  ongoing care as it  relates to all pertinent caregivers, as well as preparing the medical discharge records, prescriptions, and/or referrals as applicable, is 30 minutes.    Enzo Bi, MD  [facility] 04/27/2021, 10:26 AM

## 2021-04-27 NOTE — Care Management Important Message (Signed)
Important Message  Patient Details  Name: Tara Scott MRN: [id number] Date of Birth: 1961-07-25   Medicare Important Message Given:  Yes     Juliann Pulse A Lavonya Hoerner 04/27/2021, 11:24 AM

## 2021-04-28 DIAGNOSIS — K219 Gastro-esophageal reflux disease without esophagitis: Secondary | ICD-10-CM | POA: Diagnosis not present

## 2021-04-28 DIAGNOSIS — F32A Depression, unspecified: Secondary | ICD-10-CM | POA: Diagnosis not present

## 2021-04-28 DIAGNOSIS — S7291XA Unspecified fracture of right femur, initial encounter for closed fracture: Secondary | ICD-10-CM | POA: Diagnosis not present

## 2021-04-28 DIAGNOSIS — G7100 Muscular dystrophy, unspecified: Secondary | ICD-10-CM | POA: Diagnosis not present

## 2021-04-28 NOTE — Anesthesia Postprocedure Evaluation (Signed)
Anesthesia Post Note  Patient: ROSARIE MATUSKA  Procedure(s) Performed: TOTAL HIP ARTHROPLASTY ANTERIOR APPROACH (Right: Hip)  Patient location during evaluation: PACU Anesthesia Type: General Level of consciousness: awake and alert Pain management: pain level controlled Vital Signs Assessment: post-procedure vital signs reviewed and stable Respiratory status: spontaneous breathing, nonlabored ventilation, respiratory function stable and patient connected to nasal cannula oxygen Cardiovascular status: blood pressure returned to baseline and stable Postop Assessment: no apparent nausea or vomiting Anesthetic complications: no   No notable events documented.   Last Vitals:  Vitals:   04/27/21 1134 04/27/21 1458  BP: 112/64 120/72  Pulse: 87 79  Resp: 16 16  Temp: 36.8 C 37.5 C  SpO2: 98% 97%    Last Pain:  Vitals:   04/27/21 1228  TempSrc:   PainSc: [place] Leibish Mcgregor

## 2021-04-30 DIAGNOSIS — L309 Dermatitis, unspecified: Secondary | ICD-10-CM | POA: Diagnosis not present

## 2021-04-30 DIAGNOSIS — R110 Nausea: Secondary | ICD-10-CM | POA: Diagnosis not present

## 2021-05-05 ENCOUNTER — Encounter: Payer: Self-pay | Admitting: Orthopedic Surgery

## 2021-05-06 ENCOUNTER — Inpatient Hospital Stay: Payer: PPO | Admitting: Family Medicine

## 2021-05-06 NOTE — Progress Notes (Deleted)
      Established patient visit   Patient: Tara Scott   DOB: 08-26-1961   60 y.o. Female  MRN: [id number] Visit Date: 05/06/2021  Today's healthcare provider: Wilhemena Durie, MD   No chief complaint on file.  Subjective  -------------------------------------------------------------------------------------------------------------------- HPI  Follow up Hospitalization  Patient was admitted to [facility] on 04/22/2021 and discharged on 04/27/2021. She was treated for closed fracture of neck of right femur and fall. Treatment for this included -see discharge summary. Telephone follow up was not done. She reports {excellent/good/fair:19992} compliance with treatment. She reports this condition is {resolved/improved/worsened:23923}.  -----------------------------------------------------------------------------------------   {Show patient history (optional):23778}   Medications: Outpatient Medications Prior to Visit  Medication Sig   cholecalciferol (VITAMIN D3) 25 MCG (1000 UNIT) tablet Take 1,000 Units by mouth daily.   citalopram (CELEXA) 40 MG tablet TAKE 1 TABLET BY MOUTH EVERY DAY FOR DEPRESSION   docusate sodium (COLACE) 100 MG capsule Take 1 capsule (100 mg total) by mouth 2 (two) times daily.   enoxaparin (LOVENOX) 40 MG/0.4ML injection Inject 0.4 mLs (40 mg total) into the skin daily for 14 days.   levothyroxine (SYNTHROID) 100 MCG tablet TAKE 1 TABLET BY MOUTH EVERY DAY BEFORE BREAKFAST   methocarbamol (ROBAXIN) 500 MG tablet Take 1 tablet (500 mg total) by mouth every 6 (six) hours as needed for muscle spasms.   oxyCODONE (OXY IR/ROXICODONE) 5 MG immediate release tablet Take 1-2 tablets (5-10 mg total) by mouth every 4 (four) hours as needed for moderate pain (pain score 4-6).   No facility-administered medications prior to visit.    Review of Systems  {Labs  Heme  Chem  Endocrine  Serology  Results Review (optional):23779}   Objective   -------------------------------------------------------------------------------------------------------------------- There were no vitals taken for this visit. {Show previous vital signs (optional):23777}   Physical Exam  ***  No results found for any visits on 05/06/21.  Assessment & Plan  ---------------------------------------------------------------------------------------------------------------------- ***  No follow-ups on file.      {provider attestation***:1}   Wilhemena Durie, MD  [facility] [phone] (phone) [phone] (fax)  [place]

## 2021-05-18 DIAGNOSIS — C9590 Leukemia, unspecified not having achieved remission: Secondary | ICD-10-CM | POA: Insufficient documentation

## 2021-05-18 DIAGNOSIS — Z87442 Personal history of urinary calculi: Secondary | ICD-10-CM | POA: Insufficient documentation

## 2021-05-20 DIAGNOSIS — Z9181 History of falling: Secondary | ICD-10-CM | POA: Diagnosis not present

## 2021-05-20 DIAGNOSIS — S72001D Fracture of unspecified part of neck of right femur, subsequent encounter for closed fracture with routine healing: Secondary | ICD-10-CM | POA: Diagnosis not present

## 2021-05-20 DIAGNOSIS — Z720 Tobacco use: Secondary | ICD-10-CM | POA: Diagnosis not present

## 2021-05-20 DIAGNOSIS — E039 Hypothyroidism, unspecified: Secondary | ICD-10-CM | POA: Diagnosis not present

## 2021-05-20 DIAGNOSIS — Z96641 Presence of right artificial hip joint: Secondary | ICD-10-CM | POA: Diagnosis not present

## 2021-05-20 DIAGNOSIS — F411 Generalized anxiety disorder: Secondary | ICD-10-CM | POA: Diagnosis not present

## 2021-05-20 DIAGNOSIS — G7100 Muscular dystrophy, unspecified: Secondary | ICD-10-CM | POA: Diagnosis not present

## 2021-05-20 DIAGNOSIS — E559 Vitamin D deficiency, unspecified: Secondary | ICD-10-CM | POA: Diagnosis not present

## 2021-05-20 DIAGNOSIS — K5900 Constipation, unspecified: Secondary | ICD-10-CM | POA: Diagnosis not present

## 2021-05-20 DIAGNOSIS — F32A Depression, unspecified: Secondary | ICD-10-CM | POA: Diagnosis not present

## 2021-05-20 DIAGNOSIS — K219 Gastro-esophageal reflux disease without esophagitis: Secondary | ICD-10-CM | POA: Diagnosis not present

## 2021-05-20 DIAGNOSIS — K120 Recurrent oral aphthae: Secondary | ICD-10-CM | POA: Diagnosis not present

## 2021-05-20 DIAGNOSIS — R1310 Dysphagia, unspecified: Secondary | ICD-10-CM | POA: Diagnosis not present

## 2021-05-27 DIAGNOSIS — R1310 Dysphagia, unspecified: Secondary | ICD-10-CM | POA: Diagnosis not present

## 2021-05-27 DIAGNOSIS — Z9181 History of falling: Secondary | ICD-10-CM | POA: Diagnosis not present

## 2021-05-27 DIAGNOSIS — S72001D Fracture of unspecified part of neck of right femur, subsequent encounter for closed fracture with routine healing: Secondary | ICD-10-CM | POA: Diagnosis not present

## 2021-05-27 DIAGNOSIS — F32A Depression, unspecified: Secondary | ICD-10-CM | POA: Diagnosis not present

## 2021-05-27 DIAGNOSIS — K5900 Constipation, unspecified: Secondary | ICD-10-CM | POA: Diagnosis not present

## 2021-05-27 DIAGNOSIS — K219 Gastro-esophageal reflux disease without esophagitis: Secondary | ICD-10-CM | POA: Diagnosis not present

## 2021-05-27 DIAGNOSIS — Z720 Tobacco use: Secondary | ICD-10-CM | POA: Diagnosis not present

## 2021-05-27 DIAGNOSIS — K120 Recurrent oral aphthae: Secondary | ICD-10-CM | POA: Diagnosis not present

## 2021-05-27 DIAGNOSIS — E039 Hypothyroidism, unspecified: Secondary | ICD-10-CM | POA: Diagnosis not present

## 2021-05-27 DIAGNOSIS — E559 Vitamin D deficiency, unspecified: Secondary | ICD-10-CM | POA: Diagnosis not present

## 2021-05-27 DIAGNOSIS — F411 Generalized anxiety disorder: Secondary | ICD-10-CM | POA: Diagnosis not present

## 2021-05-27 DIAGNOSIS — G7100 Muscular dystrophy, unspecified: Secondary | ICD-10-CM | POA: Diagnosis not present

## 2021-05-27 DIAGNOSIS — Z96641 Presence of right artificial hip joint: Secondary | ICD-10-CM | POA: Diagnosis not present

## 2021-06-01 ENCOUNTER — Encounter: Payer: PPO | Admitting: Family Medicine

## 2021-06-03 ENCOUNTER — Telehealth: Payer: Self-pay

## 2021-06-03 DIAGNOSIS — S72001S Fracture of unspecified part of neck of right femur, sequela: Secondary | ICD-10-CM

## 2021-06-03 NOTE — Telephone Encounter (Signed)
Copied from [place] [id number]. Topic: General - Other >> Jun 03, 2021  2:32 PM Pawlus, Brayton Layman A wrote: Reason for CRM: Pt wanted to know if Dr B could put in home health orders since she broke her hip, had a full hip replacement, pt stated she can hardly walk. Pt wanted a call back to go over getting a home health aid. Pt stated her insurance company said she would qualify.

## 2021-06-04 NOTE — Telephone Encounter (Signed)
Have to have face to face to order [facility].  Could she do a virtual visit with someone in the office?

## 2021-06-05 ENCOUNTER — Telehealth (INDEPENDENT_AMBULATORY_CARE_PROVIDER_SITE_OTHER): Payer: PPO | Admitting: Family Medicine

## 2021-06-05 DIAGNOSIS — S72001A Fracture of unspecified part of neck of right femur, initial encounter for closed fracture: Secondary | ICD-10-CM

## 2021-06-05 NOTE — Progress Notes (Signed)
MyChart Video Visit    Virtual Visit via Video Note   This visit type was conducted due to national recommendations for restrictions regarding the COVID-19 Pandemic (e.g. social distancing) in an effort to limit this patient's exposure and mitigate transmission in our community. This patient is at least at moderate risk for complications without adequate follow up. This format is felt to be most appropriate for this patient at this time. Physical exam was limited by quality of the video and audio technology used for the visit.   Patient location: home Provider location: [facility]  I discussed the limitations of evaluation and management by telemedicine and the availability of in person appointments. The patient expressed understanding and agreed to proceed.  Patient: Tara Scott   DOB: Apr 16, 1961   60 y.o. Female  MRN: [id number] Visit Date: 06/05/2021  Today's healthcare provider: Lelon Huh, MD   Chief Complaint  Patient presents with   Difficulty Walking    Subjective    HPI  Impaired mobility: Patient sustained a hip fracture on 04/22/2021. On 04/23/2021 she had a right total hip replacement. She states that she is still having difficulty walking and would like to have a home health aide to assist with ADL.  Dr. Rudene Christians did surgery.  Qualified for 20 hours a week of home health aid, house keeping, laundry, getting in and out of tube.   [facility] doing physical therapy.    Medications: Outpatient Medications Prior to Visit  Medication Sig   cholecalciferol (VITAMIN D3) 25 MCG (1000 UNIT) tablet Take 1,000 Units by mouth daily.   citalopram (CELEXA) 40 MG tablet TAKE 1 TABLET BY MOUTH EVERY DAY FOR DEPRESSION   levothyroxine (SYNTHROID) 100 MCG tablet TAKE 1 TABLET BY MOUTH EVERY DAY BEFORE BREAKFAST   ondansetron (ZOFRAN-ODT) 4 MG disintegrating tablet Take by mouth.   oxyCODONE (OXY IR/ROXICODONE) 5 MG immediate release tablet Take 1-2 tablets (5-10 mg total) by mouth  every 4 (four) hours as needed for moderate pain (pain score 4-6).   docusate sodium (COLACE) 100 MG capsule Take 1 capsule (100 mg total) by mouth 2 (two) times daily. (Patient not taking: Reported on 06/05/2021)   methocarbamol (ROBAXIN) 500 MG tablet Take 1 tablet (500 mg total) by mouth every 6 (six) hours as needed for muscle spasms. (Patient not taking: Reported on 06/05/2021)   [DISCONTINUED] enoxaparin (LOVENOX) 40 MG/0.4ML injection Inject 0.4 mLs (40 mg total) into the skin daily for 14 days.   No facility-administered medications prior to visit.    Review of Systems  Constitutional:  Negative for appetite change, chills, fatigue and fever.  Respiratory:  Negative for chest tightness and shortness of breath.   Cardiovascular:  Negative for chest pain and palpitations.  Gastrointestinal:  Negative for abdominal pain, nausea and vomiting.  Musculoskeletal:  Positive for arthralgias (right hip pain).  Neurological:  Negative for dizziness and weakness.     Objective    There were no vitals taken for this visit.   Physical Exam   Awake, alert, oriented x 3. In no apparent distress    Assessment & Plan     1. Closed fracture of right hip, initial encounter [facility]) Patient is currently getting [facility] but unable to perform many ADLs due to limited mobility. She states her insurance covers home health aids,but her current home health agency does not have aids. Have sent message to our referral cooridinator to see if patient can get home health through another  agency, or if she will need to change home health agencies for physical therapy as well.      I discussed the assessment and treatment plan with the patient. The patient was provided an opportunity to ask questions and all were answered. The patient agreed with the plan and demonstrated an understanding of the instructions.   The patient was advised to call back or seek an in-person evaluation  if the symptoms worsen or if the condition fails to improve as anticipated.  I provided 10 minutes of non-face-to-face time during this encounter.  The entirety of the information documented in the History of Present Illness, Review of Systems and Physical Exam were personally obtained by me. Portions of this information were initially documented by the CMA and reviewed by me for thoroughness and accuracy.    Lelon Huh, MD [facility] [phone] (phone) [phone] (fax)  [place]

## 2021-06-08 DIAGNOSIS — Z96641 Presence of right artificial hip joint: Secondary | ICD-10-CM | POA: Diagnosis not present

## 2021-06-09 ENCOUNTER — Telehealth: Payer: PPO | Admitting: Family Medicine

## 2021-06-09 DIAGNOSIS — K120 Recurrent oral aphthae: Secondary | ICD-10-CM | POA: Diagnosis not present

## 2021-06-09 DIAGNOSIS — Z9181 History of falling: Secondary | ICD-10-CM | POA: Diagnosis not present

## 2021-06-09 DIAGNOSIS — R1310 Dysphagia, unspecified: Secondary | ICD-10-CM | POA: Diagnosis not present

## 2021-06-09 DIAGNOSIS — E559 Vitamin D deficiency, unspecified: Secondary | ICD-10-CM | POA: Diagnosis not present

## 2021-06-09 DIAGNOSIS — F411 Generalized anxiety disorder: Secondary | ICD-10-CM | POA: Diagnosis not present

## 2021-06-09 DIAGNOSIS — K5900 Constipation, unspecified: Secondary | ICD-10-CM | POA: Diagnosis not present

## 2021-06-09 DIAGNOSIS — G7100 Muscular dystrophy, unspecified: Secondary | ICD-10-CM | POA: Diagnosis not present

## 2021-06-09 DIAGNOSIS — Z96641 Presence of right artificial hip joint: Secondary | ICD-10-CM | POA: Diagnosis not present

## 2021-06-09 DIAGNOSIS — K219 Gastro-esophageal reflux disease without esophagitis: Secondary | ICD-10-CM | POA: Diagnosis not present

## 2021-06-09 DIAGNOSIS — F32A Depression, unspecified: Secondary | ICD-10-CM | POA: Diagnosis not present

## 2021-06-09 DIAGNOSIS — S72001D Fracture of unspecified part of neck of right femur, subsequent encounter for closed fracture with routine healing: Secondary | ICD-10-CM | POA: Diagnosis not present

## 2021-06-09 DIAGNOSIS — Z720 Tobacco use: Secondary | ICD-10-CM | POA: Diagnosis not present

## 2021-06-09 DIAGNOSIS — E039 Hypothyroidism, unspecified: Secondary | ICD-10-CM | POA: Diagnosis not present

## 2021-06-18 NOTE — Telephone Encounter (Signed)
Pt stated she has been waiting and struggling to do anything with the help of a home health aide. Pt wants a call back as soon as possible.

## 2021-06-19 NOTE — Telephone Encounter (Signed)
I sent Parke Poisson a message about this but never heard back. Patient is already getting home health Physical Therapy through [facility], but they don't have home Health Aides. Can you check with Judson Roch to see if she needs to [place] for her physical therapy in order to get a Home Health Aid. I don't know how that works.

## 2021-06-23 DIAGNOSIS — R1310 Dysphagia, unspecified: Secondary | ICD-10-CM | POA: Diagnosis not present

## 2021-06-23 DIAGNOSIS — S72001D Fracture of unspecified part of neck of right femur, subsequent encounter for closed fracture with routine healing: Secondary | ICD-10-CM | POA: Diagnosis not present

## 2021-06-23 DIAGNOSIS — G7100 Muscular dystrophy, unspecified: Secondary | ICD-10-CM | POA: Diagnosis not present

## 2021-06-23 DIAGNOSIS — F32A Depression, unspecified: Secondary | ICD-10-CM | POA: Diagnosis not present

## 2021-06-23 DIAGNOSIS — Z720 Tobacco use: Secondary | ICD-10-CM | POA: Diagnosis not present

## 2021-06-23 DIAGNOSIS — K120 Recurrent oral aphthae: Secondary | ICD-10-CM | POA: Diagnosis not present

## 2021-06-23 DIAGNOSIS — K5900 Constipation, unspecified: Secondary | ICD-10-CM | POA: Diagnosis not present

## 2021-06-23 DIAGNOSIS — F411 Generalized anxiety disorder: Secondary | ICD-10-CM | POA: Diagnosis not present

## 2021-06-23 DIAGNOSIS — Z96641 Presence of right artificial hip joint: Secondary | ICD-10-CM | POA: Diagnosis not present

## 2021-06-23 DIAGNOSIS — E559 Vitamin D deficiency, unspecified: Secondary | ICD-10-CM | POA: Diagnosis not present

## 2021-06-23 DIAGNOSIS — K219 Gastro-esophageal reflux disease without esophagitis: Secondary | ICD-10-CM | POA: Diagnosis not present

## 2021-06-23 DIAGNOSIS — E039 Hypothyroidism, unspecified: Secondary | ICD-10-CM | POA: Diagnosis not present

## 2021-06-23 DIAGNOSIS — Z9181 History of falling: Secondary | ICD-10-CM | POA: Diagnosis not present

## 2021-06-24 NOTE — Telephone Encounter (Signed)
I reached out to Tara Scott in referrals. Her response:    I have put in a call to [facility] and waiting to hear back. I do not see where there has been a recent referral made.

## 2021-06-26 NOTE — Telephone Encounter (Signed)
Message from Parke Poisson (referral coordinator):  Pt is actually getting services through [facility]. They said in order to get home Health aid they will need an order for occupational therapy along with aid

## 2021-06-28 NOTE — Telephone Encounter (Signed)
OK, I placed new order for occupational therapy and home health aid. Thank you.

## 2021-06-28 NOTE — Addendum Note (Signed)
Addended by: Lelon Huh E on: 06/28/2021 08:30 AM   Modules accepted: Orders

## 2021-07-06 NOTE — Telephone Encounter (Signed)
Pt called saying she has not heard anything back about the home health care.  She seen Dr. Caryn Section and thought everything was being taken care of and she states she is in desperate need of some help.  She has been waiting weeks for help.  She sees Dr. Mins Wednesday this week and if [facility] can't help her she will ask him.  She just needs someone to get the [facility] out to her home.  Please cal and let her know what is going on...[phone]

## 2021-07-06 NOTE — Telephone Encounter (Signed)
Per Home health referral note : Referral coordinator Parke Poisson documented that home health services with [facility] will begin no later that 07/10/2021.   The home health agency should be reaching out to patient this week if they have not already done so.  Tried calling patient. Left message to call back. OK for [facility] triage to advise.

## 2021-07-07 DIAGNOSIS — R1310 Dysphagia, unspecified: Secondary | ICD-10-CM | POA: Diagnosis not present

## 2021-07-07 DIAGNOSIS — E039 Hypothyroidism, unspecified: Secondary | ICD-10-CM | POA: Diagnosis not present

## 2021-07-07 DIAGNOSIS — Z9181 History of falling: Secondary | ICD-10-CM | POA: Diagnosis not present

## 2021-07-07 DIAGNOSIS — G7100 Muscular dystrophy, unspecified: Secondary | ICD-10-CM | POA: Diagnosis not present

## 2021-07-07 DIAGNOSIS — K5900 Constipation, unspecified: Secondary | ICD-10-CM | POA: Diagnosis not present

## 2021-07-07 DIAGNOSIS — F32A Depression, unspecified: Secondary | ICD-10-CM | POA: Diagnosis not present

## 2021-07-07 DIAGNOSIS — K120 Recurrent oral aphthae: Secondary | ICD-10-CM | POA: Diagnosis not present

## 2021-07-07 DIAGNOSIS — S72001D Fracture of unspecified part of neck of right femur, subsequent encounter for closed fracture with routine healing: Secondary | ICD-10-CM | POA: Diagnosis not present

## 2021-07-07 DIAGNOSIS — Z96641 Presence of right artificial hip joint: Secondary | ICD-10-CM | POA: Diagnosis not present

## 2021-07-07 DIAGNOSIS — E559 Vitamin D deficiency, unspecified: Secondary | ICD-10-CM | POA: Diagnosis not present

## 2021-07-07 DIAGNOSIS — Z720 Tobacco use: Secondary | ICD-10-CM | POA: Diagnosis not present

## 2021-07-07 DIAGNOSIS — K219 Gastro-esophageal reflux disease without esophagitis: Secondary | ICD-10-CM | POA: Diagnosis not present

## 2021-07-07 DIAGNOSIS — F411 Generalized anxiety disorder: Secondary | ICD-10-CM | POA: Diagnosis not present

## 2021-07-13 NOTE — Telephone Encounter (Signed)
I called patient. She states that Occupational therapy came out to see her last week, but she is still waiting on a home health aid. Do you have a contact number to call and follow up on when the home health aide will come out to patient's home?

## 2021-07-21 DIAGNOSIS — E559 Vitamin D deficiency, unspecified: Secondary | ICD-10-CM | POA: Diagnosis not present

## 2021-07-21 DIAGNOSIS — R1310 Dysphagia, unspecified: Secondary | ICD-10-CM | POA: Diagnosis not present

## 2021-07-21 DIAGNOSIS — F32A Depression, unspecified: Secondary | ICD-10-CM | POA: Diagnosis not present

## 2021-07-21 DIAGNOSIS — S72001D Fracture of unspecified part of neck of right femur, subsequent encounter for closed fracture with routine healing: Secondary | ICD-10-CM | POA: Diagnosis not present

## 2021-07-21 DIAGNOSIS — K5900 Constipation, unspecified: Secondary | ICD-10-CM | POA: Diagnosis not present

## 2021-07-21 DIAGNOSIS — Z96641 Presence of right artificial hip joint: Secondary | ICD-10-CM | POA: Diagnosis not present

## 2021-07-21 DIAGNOSIS — F411 Generalized anxiety disorder: Secondary | ICD-10-CM | POA: Diagnosis not present

## 2021-07-21 DIAGNOSIS — K219 Gastro-esophageal reflux disease without esophagitis: Secondary | ICD-10-CM | POA: Diagnosis not present

## 2021-07-21 DIAGNOSIS — K120 Recurrent oral aphthae: Secondary | ICD-10-CM | POA: Diagnosis not present

## 2021-07-21 DIAGNOSIS — E039 Hypothyroidism, unspecified: Secondary | ICD-10-CM | POA: Diagnosis not present

## 2021-07-21 DIAGNOSIS — Z9181 History of falling: Secondary | ICD-10-CM | POA: Diagnosis not present

## 2021-07-21 DIAGNOSIS — G7100 Muscular dystrophy, unspecified: Secondary | ICD-10-CM | POA: Diagnosis not present

## 2021-07-21 DIAGNOSIS — Z720 Tobacco use: Secondary | ICD-10-CM | POA: Diagnosis not present

## 2021-08-14 DIAGNOSIS — Z720 Tobacco use: Secondary | ICD-10-CM | POA: Diagnosis not present

## 2021-08-14 DIAGNOSIS — S72001D Fracture of unspecified part of neck of right femur, subsequent encounter for closed fracture with routine healing: Secondary | ICD-10-CM | POA: Diagnosis not present

## 2021-08-14 DIAGNOSIS — E039 Hypothyroidism, unspecified: Secondary | ICD-10-CM | POA: Diagnosis not present

## 2021-08-14 DIAGNOSIS — K120 Recurrent oral aphthae: Secondary | ICD-10-CM | POA: Diagnosis not present

## 2021-08-14 DIAGNOSIS — G7100 Muscular dystrophy, unspecified: Secondary | ICD-10-CM | POA: Diagnosis not present

## 2021-08-14 DIAGNOSIS — Z9181 History of falling: Secondary | ICD-10-CM | POA: Diagnosis not present

## 2021-08-14 DIAGNOSIS — K5900 Constipation, unspecified: Secondary | ICD-10-CM | POA: Diagnosis not present

## 2021-08-14 DIAGNOSIS — R1310 Dysphagia, unspecified: Secondary | ICD-10-CM | POA: Diagnosis not present

## 2021-08-14 DIAGNOSIS — F32A Depression, unspecified: Secondary | ICD-10-CM | POA: Diagnosis not present

## 2021-08-14 DIAGNOSIS — F411 Generalized anxiety disorder: Secondary | ICD-10-CM | POA: Diagnosis not present

## 2021-08-14 DIAGNOSIS — Z96641 Presence of right artificial hip joint: Secondary | ICD-10-CM | POA: Diagnosis not present

## 2021-08-14 DIAGNOSIS — E559 Vitamin D deficiency, unspecified: Secondary | ICD-10-CM | POA: Diagnosis not present

## 2021-08-14 DIAGNOSIS — K219 Gastro-esophageal reflux disease without esophagitis: Secondary | ICD-10-CM | POA: Diagnosis not present

## 2021-08-18 DIAGNOSIS — G7100 Muscular dystrophy, unspecified: Secondary | ICD-10-CM | POA: Diagnosis not present

## 2021-08-18 DIAGNOSIS — F411 Generalized anxiety disorder: Secondary | ICD-10-CM | POA: Diagnosis not present

## 2021-08-18 DIAGNOSIS — Z720 Tobacco use: Secondary | ICD-10-CM | POA: Diagnosis not present

## 2021-08-18 DIAGNOSIS — E039 Hypothyroidism, unspecified: Secondary | ICD-10-CM | POA: Diagnosis not present

## 2021-08-18 DIAGNOSIS — K120 Recurrent oral aphthae: Secondary | ICD-10-CM | POA: Diagnosis not present

## 2021-08-18 DIAGNOSIS — Z9181 History of falling: Secondary | ICD-10-CM | POA: Diagnosis not present

## 2021-08-18 DIAGNOSIS — R1310 Dysphagia, unspecified: Secondary | ICD-10-CM | POA: Diagnosis not present

## 2021-08-18 DIAGNOSIS — Z96641 Presence of right artificial hip joint: Secondary | ICD-10-CM | POA: Diagnosis not present

## 2021-08-18 DIAGNOSIS — E559 Vitamin D deficiency, unspecified: Secondary | ICD-10-CM | POA: Diagnosis not present

## 2021-08-18 DIAGNOSIS — F32A Depression, unspecified: Secondary | ICD-10-CM | POA: Diagnosis not present

## 2021-08-18 DIAGNOSIS — K219 Gastro-esophageal reflux disease without esophagitis: Secondary | ICD-10-CM | POA: Diagnosis not present

## 2021-08-18 DIAGNOSIS — K5900 Constipation, unspecified: Secondary | ICD-10-CM | POA: Diagnosis not present

## 2021-08-18 DIAGNOSIS — S72001D Fracture of unspecified part of neck of right femur, subsequent encounter for closed fracture with routine healing: Secondary | ICD-10-CM | POA: Diagnosis not present

## 2021-08-24 DIAGNOSIS — G7100 Muscular dystrophy, unspecified: Secondary | ICD-10-CM | POA: Diagnosis not present

## 2021-08-24 DIAGNOSIS — S72001D Fracture of unspecified part of neck of right femur, subsequent encounter for closed fracture with routine healing: Secondary | ICD-10-CM | POA: Diagnosis not present

## 2021-08-24 DIAGNOSIS — K5900 Constipation, unspecified: Secondary | ICD-10-CM | POA: Diagnosis not present

## 2021-08-24 DIAGNOSIS — Z96641 Presence of right artificial hip joint: Secondary | ICD-10-CM | POA: Diagnosis not present

## 2021-08-24 DIAGNOSIS — K219 Gastro-esophageal reflux disease without esophagitis: Secondary | ICD-10-CM | POA: Diagnosis not present

## 2021-08-24 DIAGNOSIS — K120 Recurrent oral aphthae: Secondary | ICD-10-CM | POA: Diagnosis not present

## 2021-08-24 DIAGNOSIS — E039 Hypothyroidism, unspecified: Secondary | ICD-10-CM | POA: Diagnosis not present

## 2021-08-24 DIAGNOSIS — F32A Depression, unspecified: Secondary | ICD-10-CM | POA: Diagnosis not present

## 2021-08-24 DIAGNOSIS — R1310 Dysphagia, unspecified: Secondary | ICD-10-CM | POA: Diagnosis not present

## 2021-08-24 DIAGNOSIS — Z9181 History of falling: Secondary | ICD-10-CM | POA: Diagnosis not present

## 2021-08-24 DIAGNOSIS — E559 Vitamin D deficiency, unspecified: Secondary | ICD-10-CM | POA: Diagnosis not present

## 2021-08-24 DIAGNOSIS — Z720 Tobacco use: Secondary | ICD-10-CM | POA: Diagnosis not present

## 2021-08-24 DIAGNOSIS — F411 Generalized anxiety disorder: Secondary | ICD-10-CM | POA: Diagnosis not present

## 2021-08-26 ENCOUNTER — Ambulatory Visit: Payer: PPO

## 2021-08-31 ENCOUNTER — Other Ambulatory Visit: Payer: Self-pay

## 2021-08-31 ENCOUNTER — Ambulatory Visit: Payer: PPO | Attending: Orthopedic Surgery

## 2021-08-31 DIAGNOSIS — R262 Difficulty in walking, not elsewhere classified: Secondary | ICD-10-CM | POA: Diagnosis not present

## 2021-08-31 DIAGNOSIS — R2681 Unsteadiness on feet: Secondary | ICD-10-CM | POA: Diagnosis not present

## 2021-08-31 DIAGNOSIS — M25651 Stiffness of right hip, not elsewhere classified: Secondary | ICD-10-CM | POA: Insufficient documentation

## 2021-09-01 NOTE — Therapy (Signed)
[place] MAIN [facility] SERVICES [address] [place], [place], [address] Phone: [phone]   Fax:  [phone]  Physical Therapy Evaluation  Patient Details  Name: Tara Scott MRN: [id number] Date of Birth: 06-19-1961 Referring Provider (PT): Dr. Hessie Knows   Encounter Date: 08/31/2021   PT End of Session - 09/01/21 0859     Visit Number 1    Number of Visits 17    Date for PT Re-Evaluation 11/23/21    Authorization Type HTA Medicare    Authorization Time Period 08/31/21-11/23/20    Progress Note Due on Visit 10    PT Start Time 1519    PT Stop Time 4034    PT Time Calculation (min) 38 min    Activity Tolerance Patient tolerated treatment well;No increased pain    Behavior During Therapy Anxious;WFL for tasks assessed/performed             Past Medical History:  Diagnosis Date   Adverse effect of malignant hyperthermia 04/16/2010   Anxiety    Closed fracture of spine at C1-C4 level with central cervical cord lesion form of MD   DVT (deep venous thrombosis) ([place])    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    Malignant hyperthermia    Toxic multinodular goiter     Past Surgical History:  Procedure Laterality Date   ADENOIDECTOMY     CESAREAN SECTION     COLONOSCOPY WITH PROPOFOL N/A 03/08/2016   Procedure: COLONOSCOPY WITH PROPOFOL;  Surgeon: Manya Silvas, MD;  [facility] END[facility]COPY;  Service: Endoscopy;  Laterality: N/A;   LAPAROTOMY  1984   OVARIAN CYST   TOTAL HIP ARTHROPLASTY Right 04/23/2021   Procedure: TOTAL HIP ARTHROPLASTY ANTERIOR APPROACH;  Surgeon: Hessie Knows, MD;  Location: [facility] ORS;  Service: Orthopedics;  Laterality: Right;    There were no vitals filed for this visit.    Subjective Assessment - 08/31/21 1528     Subjective Pt presenting for continued rehab now s/p fall,  fracture, conversion to THA, in the setting of chornic neuromuscular d/o. Pt has not been able to return to baseline gait changes or balance.    Pertinent History Tara Scott is a 31yoF who comes to [facility] OPPT for rehab s/p fall, hip fracture 04/22/21, converted to anterior THA c Dr. Rudene Christians on 04/23/21 WBAT, DC to STR at [facility] for 3 weeks, then back to home with HHPT ~Sept through November. Pt DC with WC, but quickly moved to hosuehold AMB with rollator for mobility. Pt has had a slow progression in recovery due to her chronic central core disease (variant of muscular dystrophy). Prior to fracture AMB without device able to manage limited community distances with intermittent fatigue and imbalance.    How long can you stand comfortably? Stands often due to inability to get out of low seats    How long can you walk comfortably? Was able to walk in from medical mall entrance with RW which was difficult but doable.Pt unable to walk for grocery shopping right now. Has not returned to driving yet.    Currently in Pain? --   intermittent pain with movement/actiivty, typically when rising after prolonged sitting               [facility] PT Assessment - 09/01/21 0001       Assessment   Medical Diagnosis Post traumatic THA- weakness,  AMB dysfunction    Referring Provider (PT) Dr. Hessie Knows    Onset Date/Surgical Date 04/23/21    Hand Dominance Right    Prior Therapy Acute care to STR to HHPT for ~2 months   finished HHPT ~10d prior to OPPT evaluation     Precautions   Precautions None;Fall   no hip precautions for Dr Theodore Demark direct anterior hip approach     Restrictions   Weight Bearing Restrictions Yes      Balance Screen   Has the patient fallen in the past 6 months Yes    How many times? 1   reports some occasional falls prior, but none so injurious   Has the patient had a decrease in activity level because of a fear of falling?  Yes    Is the patient reluctant to leave their home because of a  fear of falling?  Yes      [place] Private residence    Living Arrangements Alone    Available Help at Discharge Family   son brings groceries to house, pt orders online   Type of Home Apartment    Home Access Level entry    [place] - 4 wheels;Bedside commode;Shower seat      Prior Function   Level of Independence Independent with basic ADLs;Independent with household mobility with device   unable to return to homemaking activiites   Vocation On disability      Observation/Other Assessments   Focus on Therapeutic Outcomes (FOTO)  28      Transfers   Comments Longstanding difficulty with low surfaces due to chronic neuromuscular disease (Central Core Disease)   Prefers to stand to avoid low chairs, but willing to sit at 22" high surface.     Ambulation/Gait   Ambulation Distance (Feet) 300 Feet   consistent motor patterns without obvious fatigue, no clear signs of exertion   Assistive device 4-wheeled walker    Gait Pattern Step-through pattern;Wide base of support;Trunk flexed;Poor foot clearance - left;Poor foot clearance - right;Left foot flat;Right foot flat;Decreased hip/knee flexion - left;Decreased hip/knee flexion - right    Gait velocity 0.73m/s    Gait Comments avoids any knee flexion in gait cycle             INTERVENTION: -overground ambulation training without device, minGuard assist x55ft -pt very fearful and anxious, unwilling to allow 4WW farther than 51ft away, which she continuously pushes out of reach in front.  *maintains wide based gait, significant reduced trunk movement, flatfoot strike, no frank LOB.      Objective measurements completed on examination: See above findings.        PT Education - 09/01/21 0858     Education Details Easy confusion of Central Cre Disease and Central Cord Syndrome, educated pt on making sure providers understand her correctly due to rarity of Core  disease.    Person(s) Educated Patient    Methods Explanation    Comprehension Verbalized understanding              PT Short Term Goals - 09/01/21 0921       PT SHORT TERM GOAL #1   Title Pt will reports confidence in performing updated HEP for strength and balance at home alone.    Baseline Needs updates since starting OPPT    Time 4    Period Weeks    Status New    Target  Date 09/29/21      PT SHORT TERM GOAL #2   Title Pt to demonstrate tolerance of 102ft of sustained AMB with 4WW at >0.82m/s.    Baseline AMB into clinic at eval from medical mall was difficulty but achieved.    Time 4    Period Weeks    Status New    Target Date 09/29/21               PT Long Term Goals - 09/01/21 0923       PT LONG TERM GOAL #1   Title Pt to improve FOTO survey score by 15 points to indicate improved perception of ease in performing ADL/IADL activity.    Baseline Eval: FOTO 28    Time 8    Period Weeks    Status New    Target Date 10/27/21      PT LONG TERM GOAL #2   Title Pt to demonstrate ability to enter/exit drive seat of car and manage assistive device to allow for return of driving and accessing community without a 2nd person.    Baseline 12/13: not driving    Time 8    Period Weeks    Status New    Target Date 10/27/21      PT LONG TERM GOAL #3   Title Pt to demonstrate ability to AMB >176ft without assistive device and with good confidence in balance to progress toward PLOF in balance.    Baseline 12/12: Unable to walk without device without significant anxiety    Time 8    Period Weeks    Status New    Target Date 10/27/21      PT LONG TERM GOAL #4   Title Pt to demonstrate 6MWT >1423ft for improved ability to AMB community with improved energy conservation.    Time 12    Period Weeks    Status New    Target Date 11/24/21      PT LONG TERM GOAL #5   Title Pt to improve strength enough to perform 5xSTS from chair+airex pad with hands ad lib, and to  reports return to ease in rising from seated surfaces in home as prior to her hip surgery.    Baseline Unable to sit this low at evaluation.    Time 12    Period Weeks    Status New    Target Date 11/24/21                    Plan - 09/01/21 0901     Clinical Impression Statement Pt presenting for ongoing rehabilitation in the setting of continued dysfunction of gait and balance s/p posttraumatic THA. Pt has been ambulatory with 4WW since leaving rehab, but is severely anxious about any attempted walking without 4WW. Pt has baseline weakness in hips from her genetic neuromuscular d/o, however her post surgical hip weakness remains quite pronounced compared to baseline. Pt will benefit from skilled PT intervention to maximize independence and safety in basic mobility required for ADL and IADL performance and to help pt return to her functional baseline level of function.    Personal Factors and Comorbidities Age;Comorbidity 1    Comorbidities Central Core Diease (congential myopathy)    Examination-Activity Limitations Sit;Bend;Squat;Carry;Stairs;Stand;Hygiene/Grooming;Dressing;Transfers;Locomotion Level    Examination-Participation Restrictions Cleaning;Community Activity;Driving;Dorita Sciara    Stability/Clinical Decision Making Evolving/Moderate complexity    Clinical Decision Making High    Rehab Potential Good    PT Frequency 2x / week  PT Duration 12 weeks    PT Treatment/Interventions ADLs/Self Care Home Management;DME Instruction;Gait training;Stair training;Functional mobility training;Therapeutic activities;Therapeutic exercise;Balance training;Neuromuscular re-education;Patient/family education;Passive range of motion    PT Next Visit Plan Standardized balance assessment, BLE MMT as able (particularly ankles)    PT Home Exercise Plan No updates this visit, still working on some exercises from [place] and Agree with Plan of Care Patient              Patient will benefit from skilled therapeutic intervention in order to improve the following deficits and impairments:  Abnormal gait, Decreased endurance, Decreased activity tolerance, Decreased knowledge of use of DME, Decreased strength, Decreased balance, Decreased mobility, Difficulty walking, Increased muscle spasms, Decreased cognition, Decreased range of motion, Dizziness, Impaired perceived functional ability, Decreased coordination, Decreased safety awareness, Hypermobility, Impaired flexibility, Impaired tone, Improper body mechanics, Postural dysfunction  Visit Diagnosis: Unsteadiness on feet  Stiffness of right hip, not elsewhere classified  Difficulty in walking, not elsewhere classified     Problem List Patient Active Problem List   Diagnosis Date Noted   Fall at home, initial encounter 04/23/2021   Tobacco abuse 04/23/2021   Acquired hypothyroidism 04/23/2021   Right hip pain 04/23/2021   Closed right hip fracture ([place]) 04/22/2021   Esophageal dysphagia 11/03/2020   Postablative hypothyroidism 09/04/2019   Tobacco use disorder 09/04/2019   Benign essential tremor 12/26/2015   Anxiety 03/14/2015   Central core disease ([place]) 03/14/2015   Dizziness 03/14/2015   MD (muscular dystrophy) ([place]) 03/14/2015   Avitaminosis D 04/16/2010   Constipation 02/17/2010   Goiter, nontoxic, multinodular 11/04/2009   Absence of menstruation 08/18/2009   Acid reflux 12/16/2008   Cannot sleep 11/17/2008   Adnexal pain 10/04/2007   MDD (major depressive disorder) 09/01/2007   9:33 AM, 09/01/21 Etta Grandchild, PT, DPT Physical Therapist - [place] [phone]     Maish Vaya, PT 09/01/2021, 9:30 AM  [place] MAIN [facility] SERVICES [address] [place], [place], [address] Phone: [phone]   Fax:  [phone]  Name: Tara Scott MRN:  [id number] Date of Birth: 03/19/1961

## 2021-09-03 ENCOUNTER — Ambulatory Visit: Payer: PPO | Admitting: Physical Therapy

## 2021-09-08 ENCOUNTER — Ambulatory Visit: Payer: PPO | Admitting: Physical Therapy

## 2021-09-22 ENCOUNTER — Ambulatory Visit: Payer: PPO | Attending: Orthopedic Surgery

## 2021-09-25 ENCOUNTER — Ambulatory Visit: Payer: PPO

## 2021-09-29 ENCOUNTER — Ambulatory Visit: Payer: PPO

## 2021-10-01 ENCOUNTER — Ambulatory Visit: Payer: PPO

## 2021-10-06 ENCOUNTER — Ambulatory Visit: Payer: PPO | Admitting: Physical Therapy

## 2021-10-08 ENCOUNTER — Ambulatory Visit: Payer: PPO | Admitting: Physical Therapy

## 2021-10-12 ENCOUNTER — Other Ambulatory Visit: Payer: Self-pay | Admitting: Family Medicine

## 2021-10-12 DIAGNOSIS — F419 Anxiety disorder, unspecified: Secondary | ICD-10-CM

## 2021-10-12 NOTE — Telephone Encounter (Signed)
LOV: 06/05/21-for fracture 11/03/20 for Anxiety follow up NOV:10/22/2021- [facility]-Nurse Health Advisor LR: 04/13/2021

## 2021-10-18 ENCOUNTER — Other Ambulatory Visit: Payer: Self-pay | Admitting: Family Medicine

## 2021-10-18 NOTE — Telephone Encounter (Signed)
Requested Prescriptions  Pending Prescriptions Disp Refills   levothyroxine (SYNTHROID) 100 MCG tablet [Pharmacy Med Name: LEVOTHYROXINE 100 MCG TABLET] 90 tablet 1    Sig: TAKE 1 TABLET BY MOUTH EVERY DAY BEFORE BREAKFAST     Endocrinology:  Hypothyroid Agents Failed - 10/18/2021 10:31 AM      Failed - TSH needs to be rechecked within 3 months after an abnormal result. Refill until TSH is due.      Passed - TSH in normal range and within 360 days    TSH  Date Value Ref Range Status  04/23/2021 4.168 0.350 - 4.500 uIU/mL Final    Comment:    Performed by a 3rd Generation assay with a functional sensitivity of <=0.01 uIU/mL. Performed at [facility], [address] [place], [place] [address]   10/01/2020 3.950 0.450 - 4.500 uIU/mL Final         Passed - Valid encounter within last 12 months    Recent Outpatient Visits          4 months ago Closed fracture of right hip, initial encounter [facility])   [facility] Birdie Sons, MD   11 months ago Chronic low back pain without sciatica, unspecified back pain laterality   [facility] Carles Collet M, [place]   11 months ago [place], Dionne Bucy, MD   1 year ago [place], [place]   1 year ago Acute right-sided low back pain without sciatica   [facility] [place], Dionne Bucy, MD

## 2021-10-23 ENCOUNTER — Other Ambulatory Visit: Payer: Self-pay | Admitting: Family Medicine

## 2021-10-23 NOTE — Telephone Encounter (Signed)
Requested medication (s) are due for refill today: yes  Requested medication (s) are on the active medication list: no  Last refill:  11/03/20  Future visit scheduled: no  Notes to clinic:  med was dc'd on 11/17/20. Please advise     Requested Prescriptions  Pending Prescriptions Disp Refills   omeprazole (PRILOSEC) 20 MG capsule [Pharmacy Med Name: OMEPRAZOLE DR 20 MG CAPSULE] 90 capsule 1    Sig: TAKE 1 CAPSULE BY MOUTH EVERY DAY     Gastroenterology: Proton Pump Inhibitors Passed - 10/23/2021  4:22 PM      Passed - Valid encounter within last 12 months    Recent Outpatient Visits           4 months ago Closed fracture of right hip, initial encounter [facility])   [facility] Birdie Sons, MD   11 months ago Chronic low back pain without sciatica, unspecified back pain laterality   [facility] Carles Collet M, [place]   11 months ago [place], Dionne Bucy, MD   1 year ago [place], [place]   1 year ago Acute right-sided low back pain without sciatica   [facility], Dionne Bucy, MD

## 2021-11-18 ENCOUNTER — Other Ambulatory Visit: Payer: Self-pay | Admitting: Family Medicine

## 2021-11-18 DIAGNOSIS — F419 Anxiety disorder, unspecified: Secondary | ICD-10-CM

## 2021-11-18 NOTE — Telephone Encounter (Signed)
Called pt and LMOM to return call.

## 2021-11-18 NOTE — Telephone Encounter (Signed)
Requested medications are due for refill today.  yes ? ?Requested medications are on the active medications list.  yes ? ?Last refill. 1/23/223 #30 0 refills - courtesy refill ? ?Future visit scheduled.   No - pt did not show for appt 10/22/2021 ? ?Notes to clinic.  More than 3 months over due for OV. Courtesy refill already given. Pharmacy needs Dx code. ? ? ? ?Requested Prescriptions  ?Pending Prescriptions Disp Refills  ? citalopram (CELEXA) 40 MG tablet [Pharmacy Med Name: CITALOPRAM HBR 40 MG TABLET] 30 tablet 0  ?  Sig: TAKE 1 TABLET BY MOUTH EVERY DAY FOR DEPRESSION  ?  ? Psychiatry:  Antidepressants - SSRI Failed - 11/18/2021  9:21 AM  ?  ?  Failed - Completed PHQ-2 or PHQ-9 in the last 360 days  ?  ?  Passed - Valid encounter within last 6 months  ?  Recent Outpatient Visits   ? ?      ? 5 months ago Closed fracture of right hip, initial encounter ([place])  ? [facility] Birdie Sons, MD  ? 1 year ago Chronic low back pain without sciatica, unspecified back pain laterality  ? [facility] [place], [place], [place]  ? 1 year ago Anxiety  ? [facility], Dionne Bucy, MD  ? 1 year ago Anxiety  ? [place], [place]  ? 1 year ago Acute right-sided low back pain without sciatica  ? [facility] [place], Dionne Bucy, MD  ? ?  ?  ? ?  ?  ?  ?  ?

## 2021-11-18 NOTE — Telephone Encounter (Signed)
Tried calling patient. Left message to call back. OK for [facility] triage to advise patient that's she is overdue for follow up appointment. OK for [facility] to schedule appointment. ?

## 2021-11-19 NOTE — Telephone Encounter (Signed)
Called patient to schedule appt for further refills. No answer, LVMTCB and patient will be required to schedule appt prior to refills.  ?

## 2021-11-19 NOTE — Telephone Encounter (Signed)
Requested medication (s) are due for refill today: yes ? ?Requested medication (s) are on the active medication list: yes ? ?Last refill:  10/12/21 #30 0 refills ? ?Future visit scheduled: no ? ?Notes to clinic:  do you want a 2nd courtesy refill? Patient called x 3 to schedule appt . LVMTCB ? ? ?  ?Requested Prescriptions  ?Pending Prescriptions Disp Refills  ? citalopram (CELEXA) 40 MG tablet [Pharmacy Med Name: CITALOPRAM HBR 40 MG TABLET] 30 tablet 0  ?  Sig: TAKE 1 TABLET BY MOUTH EVERY DAY FOR DEPRESSION  ?  ? Psychiatry:  Antidepressants - SSRI Failed - 11/18/2021  2:11 PM  ?  ?  Failed - Completed PHQ-2 or PHQ-9 in the last 360 days  ?  ?  Passed - Valid encounter within last 6 months  ?  Recent Outpatient Visits   ? ?      ? 5 months ago Closed fracture of right hip, initial encounter ([place])  ? [facility] Birdie Sons, MD  ? 1 year ago Chronic low back pain without sciatica, unspecified back pain laterality  ? [facility] [place], [place], [place]  ? 1 year ago Anxiety  ? [facility] Bacigalupo, Dionne Bucy, MD  ? 1 year ago Anxiety  ? [place], [place]  ? 1 year ago Acute right-sided low back pain without sciatica  ? [facility] Bacigalupo, Dionne Bucy, MD  ? ?  ?  ? ?  ?  ?  ? ?

## 2021-11-20 ENCOUNTER — Other Ambulatory Visit: Payer: Self-pay | Admitting: Family Medicine

## 2021-11-20 DIAGNOSIS — F419 Anxiety disorder, unspecified: Secondary | ICD-10-CM

## 2021-11-20 NOTE — Telephone Encounter (Signed)
Called pt and LMOM  to call back for appointment. ?

## 2021-11-23 ENCOUNTER — Telehealth: Payer: Self-pay | Admitting: *Deleted

## 2021-11-23 NOTE — Telephone Encounter (Signed)
Message left for a call back to schedule appt prior to medication refills. ?

## 2021-11-23 NOTE — Telephone Encounter (Signed)
Requested medication (s) are due for refill today: yes ? ?Requested medication (s) are on the active medication list: yes ? ?Last refill:  10/12/21 #30 with 0 RF ? ?Future visit scheduled: no ? ?Notes to clinic:  Has already had a curtesy refill and there is no upcoming appointment scheduled. Unable to reach by phone. ? ? ?Requested Prescriptions  ?Pending Prescriptions Disp Refills  ? citalopram (CELEXA) 40 MG tablet [Pharmacy Med Name: CITALOPRAM HBR 40 MG TABLET] 90 tablet 1  ?  Sig: TAKE 1 TABLET BY MOUTH EVERY DAY FOR DEPRESSION  ?  ? Psychiatry:  Antidepressants - SSRI Failed - 11/20/2021  4:30 PM  ?  ?  Failed - Completed PHQ-2 or PHQ-9 in the last 360 days  ?  ?  Passed - Valid encounter within last 6 months  ?  Recent Outpatient Visits   ? ?      ? 5 months ago Closed fracture of right hip, initial encounter ([place])  ? [facility] Birdie Sons, MD  ? 1 year ago Chronic low back pain without sciatica, unspecified back pain laterality  ? [facility] [place], [place], [place]  ? 1 year ago Anxiety  ? [facility] Bacigalupo, Dionne Bucy, MD  ? 1 year ago Anxiety  ? [place], [place]  ? 1 year ago Acute right-sided low back pain without sciatica  ? [facility] Bacigalupo, Dionne Bucy, MD  ? ?  ?  ? ?  ?  ?  ? ? ?

## 2021-11-23 NOTE — Telephone Encounter (Signed)
Me ?  ?10:16 AM ?Note ?Message left for a call back to schedule appt prior to medication refills.  ?  ? ?

## 2021-12-02 ENCOUNTER — Other Ambulatory Visit: Payer: Self-pay

## 2021-12-02 ENCOUNTER — Encounter: Payer: Self-pay | Admitting: Family Medicine

## 2021-12-02 ENCOUNTER — Ambulatory Visit (INDEPENDENT_AMBULATORY_CARE_PROVIDER_SITE_OTHER): Payer: PPO | Admitting: Family Medicine

## 2021-12-02 DIAGNOSIS — L659 Nonscarring hair loss, unspecified: Secondary | ICD-10-CM | POA: Diagnosis not present

## 2021-12-02 DIAGNOSIS — G4700 Insomnia, unspecified: Secondary | ICD-10-CM

## 2021-12-02 DIAGNOSIS — E039 Hypothyroidism, unspecified: Secondary | ICD-10-CM

## 2021-12-02 DIAGNOSIS — K219 Gastro-esophageal reflux disease without esophagitis: Secondary | ICD-10-CM

## 2021-12-02 DIAGNOSIS — F339 Major depressive disorder, recurrent, unspecified: Secondary | ICD-10-CM | POA: Diagnosis not present

## 2021-12-02 DIAGNOSIS — Z5320 Procedure and treatment not carried out because of patient's decision for unspecified reasons: Secondary | ICD-10-CM | POA: Diagnosis not present

## 2021-12-02 MED ORDER — OMEPRAZOLE 20 MG PO CPDR: 20.0000 mg | DELAYED_RELEASE_CAPSULE | Freq: Every day | ORAL | 0 refills | Status: DC

## 2021-12-02 MED ORDER — CITALOPRAM HYDROBROMIDE 40 MG PO TABS: 40.0000 mg | ORAL_TABLET | Freq: Every day | ORAL | 0 refills | Status: DC

## 2021-12-02 NOTE — Assessment & Plan Note (Signed)
Unknown cause ?-could be related to thyroid, use of synthroid ?-could be related to stress from loss of twin and hip fracture ?-could be related to depression ?-could be low protein in diet ?-encouraged B vitamin complex and biotin to assist, both available OTC ?

## 2021-12-02 NOTE — Assessment & Plan Note (Signed)
Worsening complaints of GERD; was previous prescribed Omeprazole 10/2020 and did not take medication ?Was placed on Protonix after hip fracture ?Will restart low dose PPI, Omeprazole daily ?Encouraged routine meals with snacks ?Encouraged GERD friendly foods ? ?

## 2021-12-02 NOTE — Progress Notes (Signed)
? ? ?Virtual telephone visit ? ? ? ?Virtual Visit via Telephone Note  ? ?This visit type was conducted due to national recommendations for restrictions regarding the COVID-19 Pandemic (e.g. social distancing) in an effort to limit this patient's exposure and mitigate transmission in our community. Due to her co-morbid illnesses, this patient is at least at moderate risk for complications without adequate follow up. This format is felt to be most appropriate for this patient at this time. The patient did not have access to video technology or had technical difficulties with video requiring transitioning to audio format only (telephone). Physical exam was limited to content and character of the telephone converstion.   ? ?Patient location: home, apartment ?Provider location:  ?[place] [address] ?[place], [place] [address] ? ? ?I discussed the limitations of evaluation and management by telemedicine and the availability of in person appointments. The patient expressed understanding and agreed to proceed.  ? ?Visit Date: 12/02/2021 ? ?Today's healthcare provider: Gwyneth Sprout, FNP  ? ?Introduced to Designer, jewellery role and practice setting.  All questions answered.  Discussed provider/patient relationship and expectations. ? ? ?Chief Complaint  ?Patient presents with  ? Depression  ? ?Subjective  ?  ? ?Depression, Follow-up ? ?She  was last seen for this 1 years ago. ?Changes made at last visit include no charge. ?  ?She reports excellent compliance with treatment. ?She is not having side effects.  ? ?She reports good tolerance of treatment. ?Current symptoms include: weight gain and insomnia.  ?She feels she is Unchanged since last visit. ? ?Depression screen [facility] [place] 2/9 12/02/2021 11/13/2020 11/13/2020  ?Decreased Interest '3 2 2  '$ ?Down, Depressed, Hopeless '1 2 2  '$ ?PHQ - 2 Score '4 4 4  '$ ?Altered sleeping '3 1 1  '$ ?Tired, decreased energy '3 1 1  '$ ?Change in appetite 1 0 0  ?Feeling bad or  failure about yourself  0 0 0  ?Trouble concentrating '3 1 1  '$ ?Moving slowly or fidgety/restless 0 0 0  ?Suicidal thoughts 0 0 0  ?PHQ-9 Score '14 7 7  '$ ?Difficult doing work/chores Somewhat difficult Very difficult Very difficult  ?Some recent data might be hidden  ?  ?-----------------------------------------------------------------------------------------  ?Medications: ?Outpatient Medications Prior to Visit  ?Medication Sig  ? cholecalciferol (VITAMIN D3) 25 MCG (1000 UNIT) tablet Take 1,000 Units by mouth daily.  ? docusate sodium (COLACE) 100 MG capsule Take 1 capsule (100 mg total) by mouth 2 (two) times daily.  ? hydrocortisone 2.5 % ointment Apply topically.  ? levothyroxine (SYNTHROID) 100 MCG tablet TAKE 1 TABLET BY MOUTH EVERY DAY BEFORE BREAKFAST  ? [DISCONTINUED] citalopram (CELEXA) 40 MG tablet TAKE 1 TABLET BY MOUTH EVERY DAY FOR DEPRESSION  ? [DISCONTINUED] methocarbamol (ROBAXIN) 500 MG tablet Take 1 tablet (500 mg total) by mouth every 6 (six) hours as needed for muscle spasms. (Patient not taking: Reported on 06/05/2021)  ? [DISCONTINUED] nystatin (MYCOSTATIN) 100000 UNIT/ML suspension   ? [DISCONTINUED] ondansetron (ZOFRAN-ODT) 4 MG disintegrating tablet Take by mouth.  ? [DISCONTINUED] oxyCODONE (OXY IR/ROXICODONE) 5 MG immediate release tablet Take 1-2 tablets (5-10 mg total) by mouth every 4 (four) hours as needed for moderate pain (pain score 4-6). (Patient not taking: Reported on 12/02/2021)  ? ?No facility-administered medications prior to visit.  ? ? ?Review of Systems  ?Constitutional:  Positive for fatigue and unexpected weight change.  ?Psychiatric/Behavioral:  Positive for agitation, decreased concentration and sleep disturbance. The patient is nervous/anxious.   ? ? ?  Objective  ?  ?There were no vitals taken for this visit. ? ? ? ? Assessment & Plan  ?  ? ?Problem List Items Addressed This Visit   ? ?  ? Digestive  ? RESOLVED: Gastro-esophageal reflux disease without esophagitis  ?  Relevant Medications  ? omeprazole (PRILOSEC) 20 MG capsule  ? Gastroesophageal reflux disease  ?  Worsening complaints of GERD; was previous prescribed Omeprazole 10/2020 and did not take medication ?Was placed on Protonix after hip fracture ?Will restart low dose PPI, Omeprazole daily ?Encouraged routine meals with snacks ?Encouraged GERD friendly foods ? ?  ?  ? Relevant Medications  ? omeprazole (PRILOSEC) 20 MG capsule  ?  ? Endocrine  ? Acquired hypothyroidism  ?  Chronic, previously stable ?Has been taking 100 mcg Synthroid ?Reports hair loss, encouraged in person f/u for labs ?  ?  ?  ? Other  ? Cannot sleep  ?  Chronic, worsening ?Unable to initiate falling asleep ?Reports being awake >48 hours at a time ?Has 5 mg dose of melatonin; has not tried ?Encouraged to try 2.5 mg (1/2 tablet) of 5 mg of melatonin prior to bedtime ?Explained to patient that this is a natural hormone and is very well tolerated ?  ?  ? Depression, recurrent ([place]) - Primary  ?  Chronic, moderate depression ?PHQ has increased from 7 to 14 in 12 months ?Pt reports stable use of Celexa at 40 mg ?Denies SI or HI ?Has not left her apartment, except for 2 times, since August since fall ?Ambulates with use of walker and cannot lift walker into her car- does not wish to use driving services offers to assist with appts ?Son assists with food delivery, which she orders online at a local store ?  ?  ? Relevant Medications  ? citalopram (CELEXA) 40 MG tablet  ? Other Relevant Orders  ? AMB Referral to [place]  ? Hair loss  ?  Unknown cause ?-could be related to thyroid, use of synthroid ?-could be related to stress from loss of twin and hip fracture ?-could be related to depression ?-could be low protein in diet ?-encouraged B vitamin complex and biotin to assist, both available OTC ?  ?  ? ? ? ?No follow-ups on file. ?  ? ?I discussed the assessment and treatment plan with the patient. The patient was provided an opportunity to  ask questions and all were answered. The patient agreed with the plan and demonstrated an understanding of the instructions. ?  ?The patient was advised to call back or seek an in-person evaluation if the symptoms worsen or if the condition fails to improve as anticipated. ? ?I provided 19 minutes of non-face-to-face time during this encounter. ? ?I, Gwyneth Sprout, FNP, have reviewed all documentation for this visit. The documentation on 12/02/21 for the exam, diagnosis, procedures, and orders are all accurate and complete. ? ? ?Gwyneth Sprout, FNP ?[place] ?[phone] (phone) ?[phone] (fax) ? ?[facility]  ?

## 2021-12-02 NOTE — Assessment & Plan Note (Signed)
Chronic, moderate depression ?PHQ has increased from 7 to 14 in 12 months ?Pt reports stable use of Celexa at 40 mg ?Denies SI or HI ?Has not left her apartment, except for 2 times, since August since fall ?Ambulates with use of walker and cannot lift walker into her car- does not wish to use driving services offers to assist with appts ?Son assists with food delivery, which she orders online at a local store ?

## 2021-12-02 NOTE — Assessment & Plan Note (Signed)
Declines breast cancer screening via mammogram at this time ? ?

## 2021-12-02 NOTE — Assessment & Plan Note (Signed)
Chronic, previously stable ?Has been taking 100 mcg Synthroid ?Reports hair loss, encouraged in person f/u for labs ?

## 2021-12-02 NOTE — Assessment & Plan Note (Signed)
Chronic, worsening ?Unable to initiate falling asleep ?Reports being awake >48 hours at a time ?Has 5 mg dose of melatonin; has not tried ?Encouraged to try 2.5 mg (1/2 tablet) of 5 mg of melatonin prior to bedtime ?Explained to patient that this is a natural hormone and is very well tolerated ?

## 2021-12-03 ENCOUNTER — Telehealth: Payer: Self-pay | Admitting: *Deleted

## 2021-12-03 NOTE — Chronic Care Management (AMB) (Signed)
?  Care Management  ? ?Note ? ?12/03/2021 ?Name: Tara Scott MRN: [id number] DOB: 1961/07/29 ? ?Tara Scott is a 61 y.o. year old female who is a primary care patient of Brita Romp, Dionne Bucy, MD and is actively engaged with the care management team. I reached out to Aleda Grana by phone today to assist with scheduling a follow up visit with the Licensed Clinical Social Worker ? ?Follow up plan: ?Telephone appointment with care management team member scheduled for: 12/10/2021 ? ?Jamear Carbonneau, CCMA ?Care Guide, Embedded Care Coordination ?[facility]  ?Direct Dial: [phone] ? ? ?

## 2021-12-10 ENCOUNTER — Ambulatory Visit (INDEPENDENT_AMBULATORY_CARE_PROVIDER_SITE_OTHER): Payer: PPO | Admitting: *Deleted

## 2021-12-10 ENCOUNTER — Telehealth: Payer: Self-pay

## 2021-12-10 ENCOUNTER — Telehealth: Payer: Self-pay | Admitting: *Deleted

## 2021-12-10 DIAGNOSIS — F419 Anxiety disorder, unspecified: Secondary | ICD-10-CM

## 2021-12-10 DIAGNOSIS — F339 Major depressive disorder, recurrent, unspecified: Secondary | ICD-10-CM

## 2021-12-10 NOTE — Chronic Care Management (AMB) (Signed)
?Chronic Care Management  ? ? Clinical Social Work Note ? ?12/10/2021 ?Name: Tara Scott MRN: [id number] DOB: Jun 30, 1961 ? ?ALICEN DONALSON is a 61 y.o. year old female who is a primary care patient of Bacigalupo, Dionne Bucy, MD. The CCM team was consulted to assist the patient with chronic disease management and/or care coordination needs related to: Mental Health Counseling and Resources.  ? ?Engaged with patient by telephone for follow up visit in response to provider referral for social work chronic care management and care coordination services.  ? ?Consent to Services:  ?The patient was given the following information about Chronic Care Management services today, agreed to services, and gave verbal consent: 1. CCM service includes personalized support from designated clinical staff supervised by the primary care provider, including individualized plan of care and coordination with other care providers 2. 24/7 contact phone numbers for assistance for urgent and routine care needs. 3. Service will only be billed when office clinical staff spend 20 minutes or more in a month to coordinate care. 4. Only one practitioner may furnish and bill the service in a calendar month. 5.The patient may stop CCM services at any time (effective at the end of the month) by phone call to the office staff. 6. The patient will be responsible for cost sharing (co-pay) of up to 20% of the service fee (after annual deductible is met). Patient agreed to services and consent obtained. ? ?Patient agreed to services and consent obtained.  ? ?Assessment: Review of patient past medical history, allergies, medications, and health status, including review of relevant consultants reports was performed today as part of a comprehensive evaluation and provision of chronic care management and care coordination services.    ? ?SDOH (Social Determinants of Health) assessments and interventions performed:  ?SDOH Interventions   ? ?Flowsheet Row Most  Recent Value  ?SDOH Interventions   ?Stress Interventions Provide Counseling  ? ?  ?  ? ?Advanced Directives Status: Not addressed in this encounter. ? ?CCM Care Plan ? ?Allergies  ?Allergen Reactions  ? Anesthetics, Halogenated Other (See Comments)  ?  Other reaction(s): Other (See Comments)  ? Ciprofloxacin   ?  swelling  ? Methimazole   ?  hives  ? Oxycodone-Acetaminophen Nausea And Vomiting  ? Penicillins Other (See Comments)  ?  RXN AS A CHILD  ? ? ?Outpatient Encounter Medications as of 12/10/2021  ?Medication Sig  ? cholecalciferol (VITAMIN D3) 25 MCG (1000 UNIT) tablet Take 1,000 Units by mouth daily.  ? citalopram (CELEXA) 40 MG tablet Take 1 tablet (40 mg total) by mouth daily.  ? docusate sodium (COLACE) 100 MG capsule Take 1 capsule (100 mg total) by mouth 2 (two) times daily.  ? hydrocortisone 2.5 % ointment Apply topically.  ? levothyroxine (SYNTHROID) 100 MCG tablet TAKE 1 TABLET BY MOUTH EVERY DAY BEFORE BREAKFAST  ? omeprazole (PRILOSEC) 20 MG capsule Take 1 capsule (20 mg total) by mouth daily.  ? ?No facility-administered encounter medications on file as of 12/10/2021.  ? ? ?Patient Active Problem List  ? Diagnosis Date Noted  ? Hair loss 12/02/2021  ? Gastroesophageal reflux disease 12/02/2021  ? Depression, recurrent ([place]) 12/02/2021  ? Mammogram declined 12/02/2021  ? History of urinary stone 05/18/2021  ? Leukemia ([place]) 05/18/2021  ? Fall at home, initial encounter 04/23/2021  ? Tobacco abuse 04/23/2021  ? Acquired hypothyroidism 04/23/2021  ? Right hip pain 04/23/2021  ? Closed right hip fracture ([place]) 04/22/2021  ? Esophageal dysphagia 11/03/2020  ?  Tobacco use disorder 09/04/2019  ? Postablative hypothyroidism 10/09/2018  ? Benign essential tremor 12/26/2015  ? Anxiety 03/14/2015  ? Central core disease ([place]) 03/14/2015  ? Dizziness 03/14/2015  ? Muscular dystrophy ([place]) 03/14/2015  ? 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 Vitamin D deficiency, unspecified  04/16/2010  ? Constipation 02/17/2010  ? Goiter, nontoxic, multinodular 11/04/2009  ? Absence of menstruation 08/18/2009  ? Cannot sleep 11/17/2008  ? Adnexal pain 10/04/2007  ? MDD (major depressive disorder) 09/01/2007  ? ? ?Conditions to be addressed/monitored: Anxiety and Depression; Mental Health Concerns  ? ?Care Plan : Depression (Adult)  ?Updates made by [name], LCSW since 12/10/2021 12:00 AM  ?  ? ?Problem: Symptoms (Depression)   ?  ? ?Long-Range Goal: Symptoms Monitored and Managed Completed 12/10/2021  ?Start Date: 10/15/2020  ?Expected End Date: 03/16/2021  ?Recent Progress: Not on track  ?Priority: High  ?Note:   ?Current Barriers:  ?Mental Health Concerns  ?Unable to independently cope with recent loss of sister and spouse ? ?Clinical Social Work Clinical Goal(s):  ?Over the next 90 days, patient will follow up with with a mental health therapist for ongoing mental health counseling* as directed by SW ? ?Interventions: ?1:1 collaboration with [name], MD regarding development and update of comprehensive plan of care as evidenced by provider attestation and co-signature ?Inter-disciplinary care team collaboration (see longitudinal plan of care) ?Phone call from [place] who confirmed that she has contacted patient 3 times with no return call from patient ?Patient's referral will be closed at this time  ? ?Patient Goals/Self-Care Activities ?Over the next 90 days, patient will:  ? - Patient will self administer medications as prescribed ?Patient will attend all scheduled provider appointments ?Patient will continue to perform ADL's independently ?Patient will continue to perform IADL's independently ?Patient will call provider office for new concerns or questions ? ?Follow up Plan: SW will follow up with patient by phone over the next 7-14 business days ? ? ? ? ? ? ?  ? ?Task: Alleviate Barriers to Depression Treatment Completed 12/10/2021  ?Due Date:  03/17/2021  ?Priority: Routine  ?Note:   ?  ?:  ?  ? ?Problem: CHL AMB "PATIENT-SPECIFIC PROBLEM"   ?Note:   ?CARE PLAN ENTRY ?(see longitudinal plan of care for additional care plan information) ? ?Current Barriers:  ?Knowledge deficits related to accessing mental health provider in patient with Depression  ?Patient is experiencing symptoms of  depression which seem to be exacerbated by current medical condition.     ?Patient needs Support, Education, and Care Coordination in order to meet unmet mental health needs  ?ADL IADL limitations and Mental Health Concerns  ? ?Clinical Social Work Goal(s):  ?Over the next 90 days, patient will work with SW monthly by telephone or in person to reduce or manage symptoms of depression until connected for ongoing counseling resources.  ?Patient will implement clinical interventions discussed today to decreases symptoms of depression and increase knowledge and/or ability of: coping skills. ? ?Interventions:  ?Assessed patient's understanding, education, previous treatment and care coordination needs  ?Patient interviewed and appropriate assessments performed: PHQ 2 ?PHQ 9 ?Provided basic mental health support, education and interventions  ?Patient discussed feelings of isolation-lacking purpose, crying spells, difficulty sleeping related to loss of her spouse and sister as well as difficulty managing physical limitations ?Collaborated with appropriate clinical care team members regarding patient needs ?Discussed options for long term counseling based on need and insurance.  Resources provided for [facility] for mental health follow up [phone] ?Positive coping strategies discussed ?Reviewed mental health medications with patient prescribed by PCP and discussed compliance  ?Other interventions include: Depression screen reviewed  ?Patient provided with validation and encouragement ?Solution-Focused Strategies employed ?Active listening / Reflection utilized   ?Emotional Support Provided  ? ?Patient Self Care Activities & Deficits:  ?Patient is unable to independently navigate community resource options without care coordination support ?Patient is able to implement clinical

## 2021-12-10 NOTE — Telephone Encounter (Signed)
?  Care Management  ? ?Follow Up Note ? ? ?12/10/2021 ?Name: Tara Scott MRN: [id number] DOB: [birth year] ? ? ?Referred by: Virginia Crews, MD ?Reason for referral : Care Coordination ? ? ?An unsuccessful telephone outreach was attempted today. The patient was referred to the case management team for assistance with care management and care coordination.  ? ?Follow Up Plan: Telephone follow up appointment with care management team member to be re-scheduled by careguide. ? ? ?Icarus Partch, LCSW ?Clinical Social Worker  ?[facility] ?[phone] ? ? ?

## 2021-12-10 NOTE — Patient Instructions (Addendum)
Visit Information ? ?Thank you for taking time to visit with me today. Please don't hesitate to contact me if I can be of assistance to you before our next scheduled telephone appointment. ? ?Following are the goals we discussed today:  ?- begin personal counseling ?- talk about feelings with a friend, family or spiritual advisor ?- practice positive thinking and self-talk ? ?Our next appointment is by telephone on 12/22/21 at 1pm ? ?Please call the care guide team at [phone] if you need to cancel or reschedule your appointment.  ? ?If you are experiencing a Mental Health or [place] or need someone to talk to, please call the Suicide and Crisis Lifeline: 988  ? ?Following is a copy of your full plan of care:  ?Care Plan : Depression (Adult)  ?Updates made by [name], LCSW since 12/10/2021 12:00 AM  ?  ? ?Problem: Symptoms (Depression)   ?  ? ?Long-Range Goal: Symptoms Monitored and Managed Completed 12/10/2021  ?Start Date: 10/15/2020  ?Expected End Date: 03/16/2021  ?Recent Progress: Not on track  ?Priority: High  ?Note:   ?Current Barriers:  ?Mental Health Concerns  ?Unable to independently cope with recent loss of sister and spouse ? ?Clinical Social Work Clinical Goal(s):  ?Over the next 90 days, patient will follow up with with a mental health therapist for ongoing mental health counseling* as directed by SW ? ?Interventions: ?1:1 collaboration with [name], MD regarding development and update of comprehensive plan of care as evidenced by provider attestation and co-signature ?Inter-disciplinary care team collaboration (see longitudinal plan of care) ?Phone call from [place] who confirmed that she has contacted patient 3 times with no return call from patient ?Patient's referral will be closed at this time  ? ?Patient Goals/Self-Care Activities ?Over the next 90 days, patient will:  ? - Patient will self administer medications as  prescribed ?Patient will attend all scheduled provider appointments ?Patient will continue to perform ADL's independently ?Patient will continue to perform IADL's independently ?Patient will call provider office for new concerns or questions ? ?Follow up Plan: SW will follow up with patient by phone over the next 7-14 business days ? ? ? ? ? ? ?  ? ?Task: Alleviate Barriers to Depression Treatment Completed 12/10/2021  ?Due Date: 03/17/2021  ?Priority: Routine  ?Note:   ?  ?:  ?  ? ?Problem: CHL AMB "PATIENT-SPECIFIC PROBLEM"   ?Note:   ?CARE PLAN ENTRY ?(see longitudinal plan of care for additional care plan information) ? ?Current Barriers:  ?Knowledge deficits related to accessing mental health provider in patient with Depression  ?Patient is experiencing symptoms of  depression which seem to be exacerbated by current medical condition.     ?Patient needs Support, Education, and Care Coordination in order to meet unmet mental health needs  ?ADL IADL limitations and Mental Health Concerns  ? ?Clinical Social Work Goal(s):  ?Over the next 90 days, patient will work with SW monthly by telephone or in person to reduce or manage symptoms of depression until connected for ongoing counseling resources.  ?Patient will implement clinical interventions discussed today to decreases symptoms of depression and increase knowledge and/or ability of: coping skills. ? ?Interventions:  ?Assessed patient's understanding, education, previous treatment and care coordination needs  ?Patient interviewed and appropriate assessments performed: PHQ 2 ?PHQ 9 ?Provided basic mental health support, education and interventions  ?Patient discussed feelings of isolation-lacking purpose, crying spells, difficulty sleeping related to loss of her spouse and  sister as well as difficulty managing physical limitations ?Collaborated with appropriate clinical care team members regarding patient needs ?Discussed options for long term counseling based on  need and insurance. Resources provided for [facility] for mental health follow up [phone] ?Positive coping strategies discussed ?Reviewed mental health medications with patient prescribed by PCP and discussed compliance  ?Other interventions include: Depression screen reviewed  ?Patient provided with validation and encouragement ?Solution-Focused Strategies employed ?Active listening / Reflection utilized  ?Emotional Support Provided  ? ?Patient Self Care Activities & Deficits:  ?Patient is unable to independently navigate community resource options without care coordination support ?Patient is able to implement clinical interventions discussed today and is motivated for treatment   ?Attends all scheduled provider appointments ?Performs ADL's independently ? ?Initial goal documentation ? ?  ? ? ?Tara Scott was given information about Care Management services by the embedded care coordination team including:  ?Care Management services include personalized support from designated clinical staff supervised by her physician, including individualized plan of care and coordination with other care providers ?24/7 contact phone numbers for assistance for urgent and routine care needs. ?The patient may stop CCM services at any time (effective at the end of the month) by phone call to the office staff. ? ?Patient agreed to services and verbal consent obtained.  ? ?Patient verbalizes understanding of instructions and care plan provided today and agrees to view in [place]. Active MyChart status confirmed with patient.   ? ?Telephone follow up appointment with care management team member scheduled for: 12/22/21 ? ? ?[name], LCSW ?Clinical Social Worker  ?[facility] ?[phone] ? ? ?  ?

## 2021-12-10 NOTE — Telephone Encounter (Signed)
Copied from [place] [id number]. Topic: General - Other ?>> Dec 10, 2021  2:31 PM Wynetta Emery, [place] C wrote: ?Reason for CRM: pt called in for assist pt says that she had a visit with Rollene Rotunda and was told that she would need to have some labs completed. Pt says that she has been waiting for someone to reach out to her to let her know when. Not showing orders in the system.  ? ? ?Please assist pt further. ?

## 2021-12-10 NOTE — Telephone Encounter (Signed)
Pt called in to follow up on her call with [place]. Verified pt's phone number, showing the incorrect number was called. Pt says that her phone number is [phone]. Pt would like a call back to reschedule.  ?

## 2021-12-11 ENCOUNTER — Other Ambulatory Visit: Payer: Self-pay | Admitting: Family Medicine

## 2021-12-11 DIAGNOSIS — F339 Major depressive disorder, recurrent, unspecified: Secondary | ICD-10-CM

## 2021-12-11 DIAGNOSIS — L659 Nonscarring hair loss, unspecified: Secondary | ICD-10-CM

## 2021-12-11 DIAGNOSIS — E039 Hypothyroidism, unspecified: Secondary | ICD-10-CM

## 2021-12-11 DIAGNOSIS — G4700 Insomnia, unspecified: Secondary | ICD-10-CM

## 2021-12-11 DIAGNOSIS — Z13220 Encounter for screening for lipoid disorders: Secondary | ICD-10-CM

## 2021-12-11 DIAGNOSIS — Z131 Encounter for screening for diabetes mellitus: Secondary | ICD-10-CM

## 2021-12-14 DIAGNOSIS — E038 Other specified hypothyroidism: Secondary | ICD-10-CM | POA: Diagnosis not present

## 2021-12-14 DIAGNOSIS — F411 Generalized anxiety disorder: Secondary | ICD-10-CM | POA: Diagnosis not present

## 2021-12-14 DIAGNOSIS — M2550 Pain in unspecified joint: Secondary | ICD-10-CM | POA: Diagnosis not present

## 2021-12-14 DIAGNOSIS — R5383 Other fatigue: Secondary | ICD-10-CM | POA: Diagnosis not present

## 2021-12-14 DIAGNOSIS — D513 Other dietary vitamin B12 deficiency anemia: Secondary | ICD-10-CM | POA: Diagnosis not present

## 2021-12-14 DIAGNOSIS — G7129 Other congenital myopathy: Secondary | ICD-10-CM | POA: Diagnosis not present

## 2021-12-14 DIAGNOSIS — G712 Congenital myopathy, unspecified: Secondary | ICD-10-CM | POA: Diagnosis not present

## 2021-12-14 DIAGNOSIS — K219 Gastro-esophageal reflux disease without esophagitis: Secondary | ICD-10-CM | POA: Diagnosis not present

## 2021-12-14 DIAGNOSIS — E559 Vitamin D deficiency, unspecified: Secondary | ICD-10-CM | POA: Diagnosis not present

## 2021-12-18 DIAGNOSIS — F32A Depression, unspecified: Secondary | ICD-10-CM

## 2021-12-22 ENCOUNTER — Telehealth: Payer: PPO | Admitting: *Deleted

## 2021-12-22 ENCOUNTER — Telehealth: Payer: Self-pay | Admitting: *Deleted

## 2021-12-22 NOTE — Telephone Encounter (Signed)
?  Care Management  ? ?Follow Up Note ? ? ?12/22/2021 ?Name: Tara Scott MRN: [id number] DOB: [birth year] ? ? ?Referred by: Virginia Crews, MD ?Reason for referral : No chief complaint on file. ? ? ?An unsuccessful telephone outreach was attempted today. The patient was referred to the case management team for assistance with care management and care coordination.  ? ?Follow Up Plan: Telephone follow up appointment with care management team member to be re-scheduled by Careguide ? ? ?Tamilyn Lupien, LCSW ?Clinical Social Worker  ?[facility] ?[phone] ? ? ?

## 2021-12-24 DIAGNOSIS — R42 Dizziness and giddiness: Secondary | ICD-10-CM | POA: Diagnosis not present

## 2021-12-24 DIAGNOSIS — H81393 Other peripheral vertigo, bilateral: Secondary | ICD-10-CM | POA: Diagnosis not present

## 2021-12-24 DIAGNOSIS — E038 Other specified hypothyroidism: Secondary | ICD-10-CM | POA: Diagnosis not present

## 2021-12-24 DIAGNOSIS — J3089 Other allergic rhinitis: Secondary | ICD-10-CM | POA: Diagnosis not present

## 2021-12-29 ENCOUNTER — Telehealth: Payer: Self-pay | Admitting: *Deleted

## 2021-12-29 NOTE — Telephone Encounter (Signed)
ATC x 2 to schedule Yearly Lung CA CT Scan. No answer and mailbox is full. ?

## 2022-01-21 DIAGNOSIS — F411 Generalized anxiety disorder: Secondary | ICD-10-CM | POA: Diagnosis not present

## 2022-01-21 DIAGNOSIS — E038 Other specified hypothyroidism: Secondary | ICD-10-CM | POA: Diagnosis not present

## 2022-01-21 DIAGNOSIS — R42 Dizziness and giddiness: Secondary | ICD-10-CM | POA: Diagnosis not present

## 2022-01-21 DIAGNOSIS — J301 Allergic rhinitis due to pollen: Secondary | ICD-10-CM | POA: Diagnosis not present

## 2022-01-28 ENCOUNTER — Telehealth: Payer: Self-pay | Admitting: Family Medicine

## 2022-01-28 NOTE — Telephone Encounter (Signed)
Copied from [place] [id number]. Topic: Medicare AWV ?>> Jan 28, 2022 10:41 AM Cher Nakai R wrote: ?Reason for CRM:  ?Left message for patient to call back and schedule Medicare Annual Wellness Visit (AWV) in office.  ? ?If unable to come into the office for AWV,  please offer to do virtually or by telephone. ? ?Last AWV: 10/16/2020 ? ?Please schedule at anytime with [facility] Health Advisor. ? ?30 minute appointment for Virtual or phone ?45 minute appointment for in office or Initial virtual/phone ? ?Any questions, please contact me at [phone] ?

## 2022-02-02 DIAGNOSIS — R42 Dizziness and giddiness: Secondary | ICD-10-CM | POA: Diagnosis not present

## 2022-02-11 DIAGNOSIS — M8589 Other specified disorders of bone density and structure, multiple sites: Secondary | ICD-10-CM | POA: Diagnosis not present

## 2022-02-11 DIAGNOSIS — K219 Gastro-esophageal reflux disease without esophagitis: Secondary | ICD-10-CM | POA: Diagnosis not present

## 2022-02-11 DIAGNOSIS — E038 Other specified hypothyroidism: Secondary | ICD-10-CM | POA: Diagnosis not present

## 2022-02-11 DIAGNOSIS — F411 Generalized anxiety disorder: Secondary | ICD-10-CM | POA: Diagnosis not present

## 2022-02-11 DIAGNOSIS — J301 Allergic rhinitis due to pollen: Secondary | ICD-10-CM | POA: Diagnosis not present

## 2022-02-12 ENCOUNTER — Other Ambulatory Visit: Payer: Self-pay | Admitting: Internal Medicine

## 2022-02-12 DIAGNOSIS — Z1231 Encounter for screening mammogram for malignant neoplasm of breast: Secondary | ICD-10-CM

## 2022-03-01 ENCOUNTER — Other Ambulatory Visit: Payer: Self-pay | Admitting: Family Medicine

## 2022-03-01 DIAGNOSIS — F339 Major depressive disorder, recurrent, unspecified: Secondary | ICD-10-CM

## 2022-03-01 NOTE — Progress Notes (Deleted)
I,Jalynn Waddell S Sybol Morre,acting as a scribe for Lavon Paganini, MD.,have documented all relevant documentation on the behalf of Lavon Paganini, MD,as directed by  Lavon Paganini, MD while in the presence of Lavon Paganini, MD.   Established patient visit   Patient: Tara Scott   DOB: 04-01-1961   61 y.o. Female  MRN: [id number] Visit Date: 03/02/2022  Today's healthcare provider: Lavon Paganini, MD   No chief complaint on file.  Subjective    HPI  Depression, Follow-up  She  was last seen for this 3 months ago. Changes made at last visit include no changes.   She reports {excellent/good/fair/poor:19665} compliance with treatment. She {is/is not:21021397} having side effects. ***  She reports {DESC; GOOD/FAIR/POOR:18685} tolerance of treatment. Current symptoms include: {Symptoms; depression:1002} She feels she is {improved/worse/unchanged:3041574} since last visit.     12/10/2021    4:30 PM 12/02/2021    1:52 PM 11/13/2020    1:30 PM  Depression screen PHQ 2/9  Decreased Interest '3 3 2  '$ Down, Depressed, Hopeless '1 1 2  '$ PHQ - 2 Score '4 4 4  '$ Altered sleeping '3 3 1  '$ Tired, decreased energy '3 3 1  '$ Change in appetite 1 1 0  Feeling bad or failure about yourself  0 0 0  Trouble concentrating '3 3 1  '$ Moving slowly or fidgety/restless 0 0 0  Suicidal thoughts 0 0 0  PHQ-9 Score '14 14 7  '$ Difficult doing work/chores  Somewhat difficult Very difficult    ----------------------------------------------------------------------------------------- Follow up for insomnia  The patient was last seen for this 3 months ago. Changes made at last visit include try melatonin 2.'5mg'$ .  She reports {excellent/good/fair/poor:19665} compliance with treatment. She feels that condition is {improved/worse/unchanged:3041574}. She {is/is not:21021397} having side effects. ***  -----------------------------------------------------------------------------------------  Hypothyroid,  follow-up  Lab Results  Component Value Date   TSH 4.168 04/23/2021   TSH 3.950 10/01/2020   TSH 2.310 03/19/2020   FREET4 1.40 07/23/2016   T4TOTAL 9.6 04/28/2018    Wt Readings from Last 3 Encounters:  04/26/21 139 lb 5.3 oz (63.2 kg)  12/09/20 138 lb (62.6 kg)  11/13/20 138 lb 8 oz (62.8 kg)    She was last seen for hypothyroid 3 months ago.  Management since that visit includes lab ordered. Labs have not been drawn She reports {excellent/good/fair/poor:19665} compliance with treatment. She {is/is not:21021397} having side effects. {document side effects if present:1}  Symptoms: {Yes/No:20286} change in energy level {Yes/No:20286} constipation  {Yes/No:20286} diarrhea {Yes/No:20286} heat / cold intolerance  {Yes/No:20286} nervousness {Yes/No:20286} palpitations  {Yes/No:20286} weight changes    -----------------------------------------------------------------------------------------  Medications: Outpatient Medications Prior to Visit  Medication Sig   cholecalciferol (VITAMIN D3) 25 MCG (1000 UNIT) tablet Take 1,000 Units by mouth daily.   citalopram (CELEXA) 40 MG tablet Take 1 tablet (40 mg total) by mouth daily.   docusate sodium (COLACE) 100 MG capsule Take 1 capsule (100 mg total) by mouth 2 (two) times daily.   hydrocortisone 2.5 % ointment Apply topically.   levothyroxine (SYNTHROID) 100 MCG tablet TAKE 1 TABLET BY MOUTH EVERY DAY BEFORE BREAKFAST   omeprazole (PRILOSEC) 20 MG capsule Take 1 capsule (20 mg total) by mouth daily.   No facility-administered medications prior to visit.    Review of Systems  {Labs  Heme  Chem  Endocrine  Serology  Results Review (optional):23779}   Objective    There were no vitals taken for this visit. {Show previous vital signs (optional):23777}  Physical Exam  ***  No results found for any visits on 03/02/22.  Assessment & Plan     ***  No follow-ups on file.      {provider attestation***:1}   Lavon Paganini, MD  [facility] [phone] (phone) [phone] (fax)  [place]

## 2022-03-02 ENCOUNTER — Other Ambulatory Visit: Payer: Self-pay | Admitting: Family Medicine

## 2022-03-02 ENCOUNTER — Ambulatory Visit: Payer: PPO | Admitting: Family Medicine

## 2022-03-02 DIAGNOSIS — K219 Gastro-esophageal reflux disease without esophagitis: Secondary | ICD-10-CM

## 2022-03-02 DIAGNOSIS — H8111 Benign paroxysmal vertigo, right ear: Secondary | ICD-10-CM | POA: Diagnosis not present

## 2022-03-02 DIAGNOSIS — F339 Major depressive disorder, recurrent, unspecified: Secondary | ICD-10-CM

## 2022-03-02 DIAGNOSIS — G4700 Insomnia, unspecified: Secondary | ICD-10-CM

## 2022-03-02 DIAGNOSIS — E039 Hypothyroidism, unspecified: Secondary | ICD-10-CM

## 2022-03-18 ENCOUNTER — Telehealth: Payer: Self-pay | Admitting: Family Medicine

## 2022-03-18 NOTE — Telephone Encounter (Signed)
Copied from [place] [id number]. Topic: Medicare AWV >> Mar 18, 2022 11:52 AM Jae Dire wrote: Reason for CRM:  Left message for patient to call back and schedule Medicare Annual Wellness Visit (AWV) in office.   If unable to come into the office for AWV,  please offer to do virtually or by telephone.  Last AWV: 10/16/2020  Please schedule at anytime with [facility] Health Advisor.  30 minute appointment for Virtual or phone 45 minute appointment for in office or Initial virtual/phone  Any questions, please contact me at [phone]

## 2022-03-29 ENCOUNTER — Telehealth: Payer: Self-pay | Admitting: *Deleted

## 2022-03-29 NOTE — Telephone Encounter (Signed)
  Care Management   Follow Up Note   03/29/2022 Name: Tara Scott MRN: [id number] DOB: 1961/06/16   Referred by: Virginia Crews, MD Reason for referral : Care Coordination   An unsuccessful telephone outreach was attempted today. The patient was referred to the case management team for assistance with care management and care coordination.   Follow Up Plan: This social worker will follow up within then next 5-7 business days.   Elliot Gurney, [place] Worker  [facility] [phone]

## 2022-03-30 ENCOUNTER — Telehealth: Payer: Self-pay | Admitting: *Deleted

## 2022-03-30 NOTE — Telephone Encounter (Signed)
  Care Management   Follow Up Note   03/30/2022 Name: JACKIE RUSSMAN MRN: [id number] DOB: 05-Oct-1960   Referred by: Virginia Crews, MD Reason for referral : Care Coordination   A second unsuccessful telephone outreach was attempted today. The patient was referred to the case management team for assistance with care management and care coordination.   Follow Up Plan: This Education officer, museum will follow up with patient within 5-7 days    Elliot Gurney, [employer] [facility] Care Management [phone]

## 2022-03-31 ENCOUNTER — Telehealth: Payer: Self-pay | Admitting: *Deleted

## 2022-03-31 NOTE — Telephone Encounter (Signed)
  Care Management   Follow Up Note   03/31/2022 Name: Tara Scott MRN: [id number] DOB: 15-Dec-1960   Referred by: Virginia Crews, MD Reason for referral : No chief complaint on file.   Third unsuccessful telephone outreach was attempted today. The patient was referred to the case management team for assistance with care management and care coordination. The patient's primary care provider has been notified of our unsuccessful attempts to make or maintain contact with the patient. The care management team is pleased to engage with this patient at any time in the future should he/she be interested in assistance from the care management team.   Follow Up Plan: We have been unable to make contact with the patient for follow up. The care management team is available to follow up with the patient after provider conversation with the patient regarding recommendation for care management engagement and subsequent re-referral to the care management team.   Elliot Gurney, [place] Worker  [facility] [phone]

## 2022-04-19 ENCOUNTER — Telehealth: Payer: Self-pay | Admitting: Family Medicine

## 2022-04-19 NOTE — Telephone Encounter (Signed)
Copied from [place]. Topic: Medicare AWV >> Apr 19, 2022  2:43 PM Jae Dire wrote: Reason for CRM:  Left message for patient to call back and schedule Medicare Annual Wellness Visit (AWV) in office.   If unable to come into the office for AWV,  please offer to do virtually or by telephone.  Last AWV:  10/16/2020  Please schedule at anytime with [facility] Health Advisor.  30 minute appointment for Virtual or phone 45 minute appointment for in office or Initial virtual/phone  Any questions, please contact me at [phone]

## 2022-04-20 DIAGNOSIS — F411 Generalized anxiety disorder: Secondary | ICD-10-CM | POA: Diagnosis not present

## 2022-04-20 DIAGNOSIS — E038 Other specified hypothyroidism: Secondary | ICD-10-CM | POA: Diagnosis not present

## 2022-04-20 DIAGNOSIS — K219 Gastro-esophageal reflux disease without esophagitis: Secondary | ICD-10-CM | POA: Diagnosis not present

## 2022-04-20 DIAGNOSIS — J3089 Other allergic rhinitis: Secondary | ICD-10-CM | POA: Diagnosis not present

## 2022-04-30 DIAGNOSIS — F411 Generalized anxiety disorder: Secondary | ICD-10-CM | POA: Diagnosis not present

## 2022-04-30 DIAGNOSIS — E038 Other specified hypothyroidism: Secondary | ICD-10-CM | POA: Diagnosis not present

## 2022-04-30 DIAGNOSIS — G7129 Other congenital myopathy: Secondary | ICD-10-CM | POA: Diagnosis not present

## 2022-04-30 DIAGNOSIS — K219 Gastro-esophageal reflux disease without esophagitis: Secondary | ICD-10-CM | POA: Diagnosis not present

## 2022-04-30 DIAGNOSIS — J3089 Other allergic rhinitis: Secondary | ICD-10-CM | POA: Diagnosis not present

## 2022-06-14 NOTE — Progress Notes (Deleted)
Psychiatric Initial Adult Assessment   Patient Identification: Tara Scott MRN:  [id number] Date of Evaluation:  06/14/2022 Referral Source: *** Chief Complaint:  No chief complaint on file.  Visit Diagnosis: No diagnosis found.  History of Present Illness:   Tara Scott is a 61 y.o. year old female with a history of depression, anxiety, post-ablative hypothyroidism with history of multinodular goiter, malignant hyperthermia, GERD, who is referred for depression.          Associated Signs/Symptoms: Depression Symptoms:  {DEPRESSION SYMPTOMS:20000} (Hypo) Manic Symptoms:  {BHH MANIC SYMPTOMS:22872} Anxiety Symptoms:  {BHH ANXIETY SYMPTOMS:22873} Psychotic Symptoms:  {BHH PSYCHOTIC SYMPTOMS:22874} PTSD Symptoms: {BHH PTSD SYMPTOMS:22875}  Past Psychiatric History:  Outpatient:  Psychiatry admission:  Previous suicide attempt:  Past trials of medication:  History of violence:    Previous Psychotropic Medications: {YES/NO:21197}  Substance Abuse History in the last 12 months:  {yes no:314532}  Consequences of Substance Abuse: {BHH CONSEQUENCES OF SUBSTANCE ABUSE:22880}  Past Medical History:  Past Medical History:  Diagnosis Date   Adverse effect of malignant hyperthermia 04/16/2010   Anxiety    Closed fracture of spine at C1-C4 level with central cervical cord lesion form of MD   DVT (deep venous thrombosis) ([place])    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    Malignant hyperthermia    Toxic multinodular goiter     Past Surgical History:  Procedure Laterality Date   ADENOIDECTOMY     CESAREAN SECTION     COLONOSCOPY WITH PROPOFOL N/A 03/08/2016   Procedure: COLONOSCOPY WITH PROPOFOL;  Surgeon: Manya Silvas, MD;  Location: [facility] ENDOSCOPY;  Service: Endoscopy;  Laterality: N/A;   LAPAROTOMY  1984   OVARIAN CYST   TOTAL HIP ARTHROPLASTY Right  04/23/2021   Procedure: TOTAL HIP ARTHROPLASTY ANTERIOR APPROACH;  Surgeon: Hessie Knows, MD;  Location: [facility] ORS;  Service: Orthopedics;  Laterality: Right;    Family Psychiatric History: ***  Family History:  Family History  Problem Relation Age of Onset   CVA Mother    Hypercholesterolemia Mother    Depression Mother    Rheum arthritis Mother    Deep vein thrombosis Mother    Bladder Cancer Father    Diverticulitis Father    Pulmonary embolism Father    Lung cancer Father    Deep vein thrombosis Sister    Colon polyps Sister    Atrial fibrillation Sister    Muscular dystrophy Sister    Cancer Sister        lung spreading all over   Diabetes Son        type 2    Social History:   Social History   Socioeconomic History   Marital status: Widowed    Spouse name: Not on file   Number of children: 1   Years of education: Not on file   Highest education level: High school graduate  Occupational History   Occupation: disability  Tobacco Use   Smoking status: Every Day    Packs/day: 0.50    Years: 40.00    Total pack years: 20.00    Types: Cigarettes   Smokeless tobacco: Never  Vaping Use   Vaping Use: Never used  Substance and Sexual Activity   Alcohol use: No    Alcohol/week: 0.0 standard drinks of alcohol   Drug use: No   Sexual activity: Not on file  Other Topics Concern  Not on file  Social History Narrative   Not on file   Social Determinants of Health   Financial Resource Strain: Low Risk  (10/15/2020)   Overall Financial Resource Strain (CARDIA)    Difficulty of Paying Living Expenses: Not very hard  Food Insecurity: No Food Insecurity (12/10/2021)   Hunger Vital Sign    Worried About Running Out of Food in the Last Year: Never true    Ran Out of Food in the Last Year: Never true  Transportation Needs: No Transportation Needs (12/10/2021)   PRAPARE - Hydrologist (Medical): No    Lack of Transportation (Non-Medical): No   Physical Activity: Inactive (10/16/2020)   Exercise Vital Sign    Days of Exercise per Week: 0 days    Minutes of Exercise per Session: 0 min  Stress: Stress Concern Present (12/10/2021)   [place]    Feeling of Stress : Rather much  Social Connections: Socially Isolated (10/16/2020)   Social Connection and Isolation Panel [NHANES]    Frequency of Communication with Friends and Family: Once a week    Frequency of Social Gatherings with Friends and Family: Never    Attends Religious Services: Never    Marine scientist or Organizations: No    Attends Archivist Meetings: Never    Marital Status: Widowed    Additional Social History: ***  Allergies:   Allergies  Allergen Reactions   Anesthetics, Halogenated Other (See Comments)    Other reaction(s): Other (See Comments)   Ciprofloxacin     swelling   Methimazole     hives   Oxycodone-Acetaminophen Nausea And Vomiting   Penicillins Other (See Comments)    RXN AS A CHILD    Metabolic Disorder Labs: No results found for: "HGBA1C", "MPG" No results found for: "PROLACTIN" Lab Results  Component Value Date   CHOL 197 10/05/2017   TRIG 120 10/05/2017   HDL 65 10/05/2017   CHOLHDL 3.0 10/05/2017   LDLCALC 108 (H) 10/05/2017   Lab Results  Component Value Date   TSH 4.168 04/23/2021    Therapeutic Level Labs: No results found for: "LITHIUM" No results found for: "CBMZ" No results found for: "VALPROATE"  Current Medications: Current Outpatient Medications  Medication Sig Dispense Refill   cholecalciferol (VITAMIN D3) 25 MCG (1000 UNIT) tablet Take 1,000 Units by mouth daily.     citalopram (CELEXA) 40 MG tablet TAKE 1 TABLET BY MOUTH EVERY DAY 90 tablet 0   docusate sodium (COLACE) 100 MG capsule Take 1 capsule (100 mg total) by mouth 2 (two) times daily. 10 capsule 0   hydrocortisone 2.5 % ointment Apply topically.     levothyroxine  (SYNTHROID) 100 MCG tablet TAKE 1 TABLET BY MOUTH EVERY DAY BEFORE BREAKFAST 90 tablet 1   omeprazole (PRILOSEC) 20 MG capsule TAKE 1 CAPSULE BY MOUTH EVERY DAY 90 capsule 0   No current facility-administered medications for this visit.    Musculoskeletal: Strength & Muscle Tone: within normal limits Gait & Station: normal Patient leans: N/A  Psychiatric Specialty Exam: Review of Systems  There were no vitals taken for this visit.There is no height or weight on file to calculate BMI.  General Appearance: {Appearance:22683}  Eye Contact:  {BHH EYE CONTACT:22684}  Speech:  Clear and Coherent  Volume:  Normal  Mood:  {BHH MOOD:22306}  Affect:  {Affect (PAA):22687}  Thought Process:  Coherent  Orientation:  Full (Time, Place,  and Person)  Thought Content:  Logical  Suicidal Thoughts:  {ST/HT (PAA):22692}  Homicidal Thoughts:  {ST/HT (PAA):22692}  Memory:  Immediate;   Good  Judgement:  {Judgement (PAA):22694}  Insight:  {Insight (PAA):22695}  Psychomotor Activity:  Normal  Concentration:  Concentration: Good and Attention Span: Good  Recall:  Good  Fund of Knowledge:Good  Language: Good  Akathisia:  No  Handed:  Right  AIMS (if indicated):  not done  Assets:  Communication Skills Desire for Improvement  ADL's:  Intact  Cognition: WNL  Sleep:  {BHH GOOD/FAIR/POOR:22877}   Screenings: GAD-7    Flowsheet Row Office Visit from 12/02/2021 in [place] Visit from 09/04/2019 in [place]  Total GAD-7 Score 6 17      PHQ2-9    [place] [facility] from 12/10/2021 in [place] Visit from 12/02/2021 in [place] Visit from 11/13/2020 in [place] Visit from 11/03/2020 in [place] Management from 10/28/2020 in [place]  PHQ-2 Total Score '4 4 4 6 6  '$ PHQ-9 Total Score '14 14 7 14 16      '$ Flowsheet Row ED to  Hosp-Admission (Discharged) from 04/22/2021 in [place] (1A)  C-SSRS RISK CATEGORY No Risk       Assessment and Plan:  Assessment  Plan   The patient demonstrates the following risk factors for suicide: Chronic risk factors for suicide include: {Chronic Risk Factors for PPJKDTO:67124580}. Acute risk factors for suicide include: {Acute Risk Factors for DXIPJAS:50539767}. Protective factors for this patient include: {Protective Factors for Suicide HALP:37902409}. Considering these factors, the overall suicide risk at this point appears to be {Desc; low/moderate/high:110033}. Patient {ACTION; IS/IS BDZ:32992426} appropriate for outpatient follow up.   Collaboration of Care: {BH OP Collaboration of Care:21014065}  Patient/Guardian was advised Release of Information must be obtained prior to any record release in order to collaborate their care with an outside provider. Patient/Guardian was advised if they have not already done so to contact the registration department to sign all necessary forms in order for [place] to release information regarding their care.   Consent: Patient/Guardian gives verbal consent for treatment and assignment of benefits for services provided during this visit. Patient/Guardian expressed understanding and agreed to proceed.   Norman Clay, MD 9/25/20232:24 PM

## 2022-06-17 ENCOUNTER — Ambulatory Visit: Payer: PPO | Admitting: Psychiatry

## 2022-06-18 DIAGNOSIS — F411 Generalized anxiety disorder: Secondary | ICD-10-CM | POA: Diagnosis not present

## 2022-06-18 DIAGNOSIS — K219 Gastro-esophageal reflux disease without esophagitis: Secondary | ICD-10-CM | POA: Diagnosis not present

## 2022-06-18 DIAGNOSIS — E038 Other specified hypothyroidism: Secondary | ICD-10-CM | POA: Diagnosis not present

## 2022-06-18 DIAGNOSIS — J209 Acute bronchitis, unspecified: Secondary | ICD-10-CM | POA: Diagnosis not present

## 2022-07-09 IMAGING — MR MR LUMBAR SPINE W/O CM
5 series · 30 of 48 positions shown · non-contrast
Comparison: Prior radiograph from 07/23/2016.

CLINICAL DATA: Initial evaluation for low back pain, cauda equina
syndrome suspected.

EXAM:
MRI LUMBAR SPINE WITHOUT CONTRAST
TECHNIQUE: Multiplanar, multisequence MR imaging of the lumbar spine was
performed. No intravenous contrast was administered.

[Series 5: T2 · sagittal · 4.0mm · 0.81mm/px · 6 of 15 slices shown (1 of 2)]
[im 1/15]
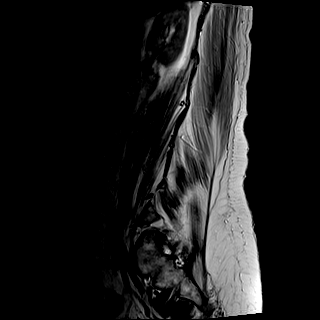
[im 3/15]
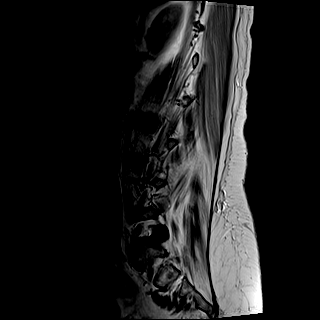
[im 6/15]
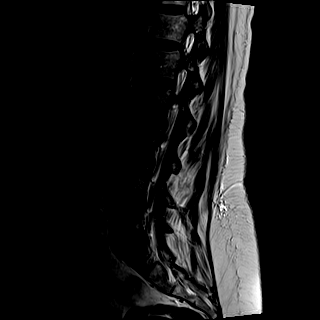
[im 9/15]
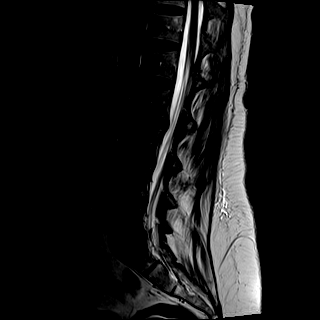
[im 12/15]
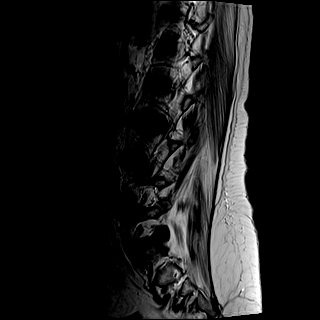
[im 15/15]
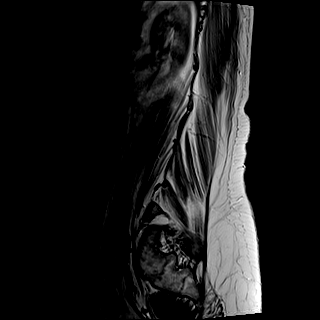

[Series 6: T1 · sagittal · 4.0mm · 0.81mm/px · 7 of 15 slices shown (1 of 2)]
[im 1/15]
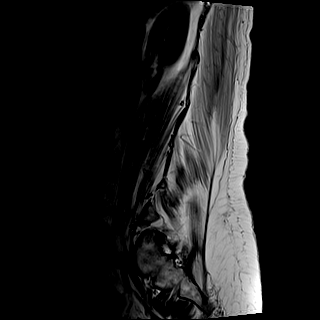
[im 3/15]
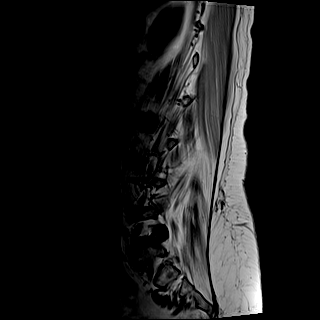
[im 5/15]
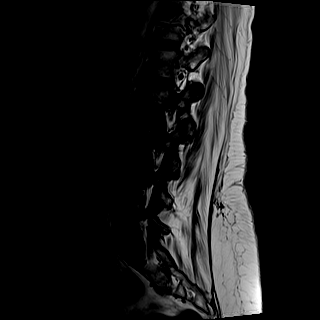
[im 8/15]
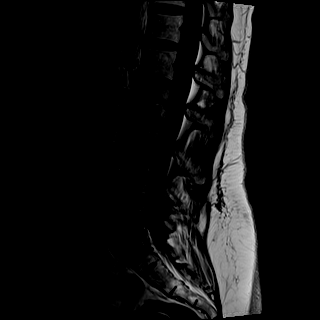
[im 10/15]
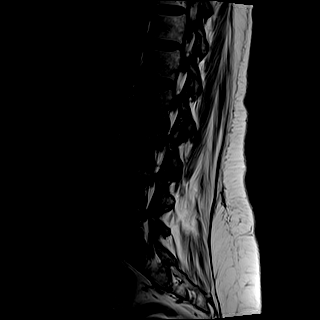
[im 12/15]
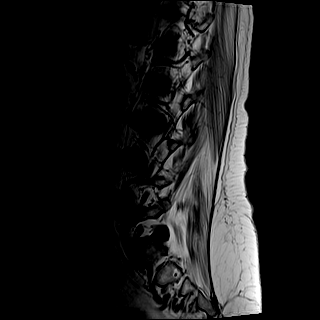
[im 15/15]
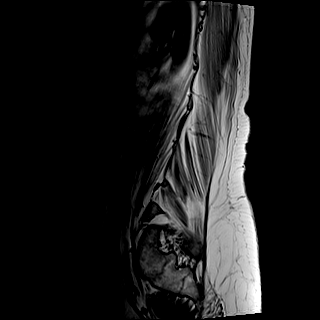

[Series 7: STIR · sagittal · 4.0mm · 0.41mm/px · 1 of 15 slices shown]
[im 1/15]
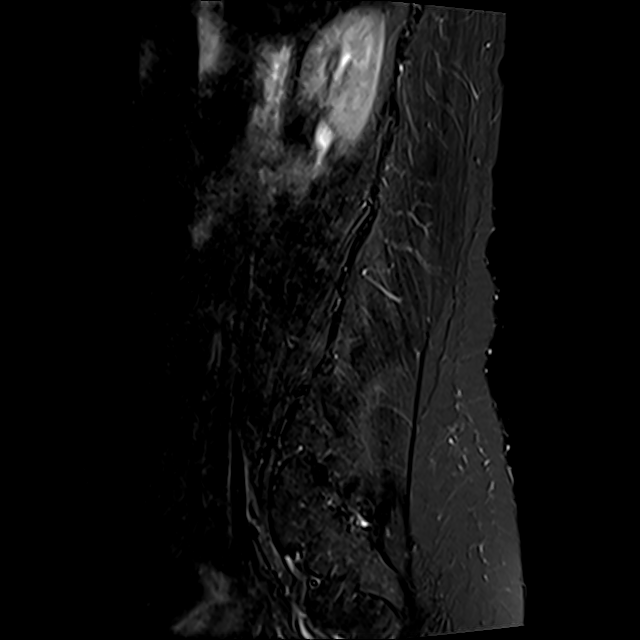

[Series 8: T2 · axial · 4.0mm · 0.78mm/px · z∈[-88,+85]mm · 8 of 30 slices shown (2 of 2)]
[im 1/30]
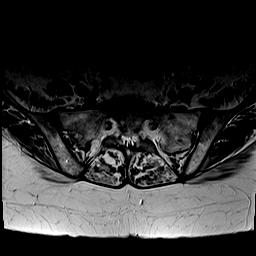
[im 5/30]
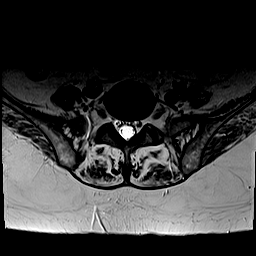
[im 9/30]
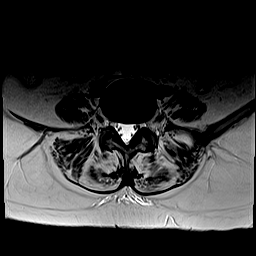
[im 14/30]
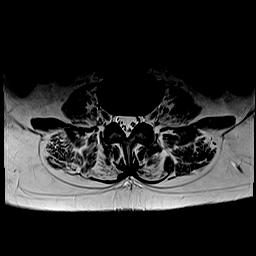
[im 16/30]
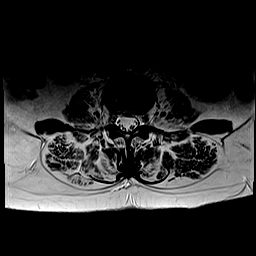
[im 21/30]
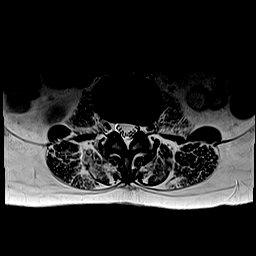
[im 25/30]
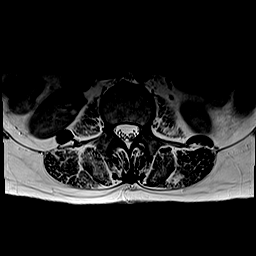
[im 30/30]
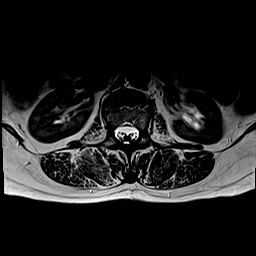

[Series 9: T1 · axial · 4.0mm · 0.39mm/px · z∈[-88,+85]mm · 8 of 30 slices shown (2 of 2)]
[im 1/30]
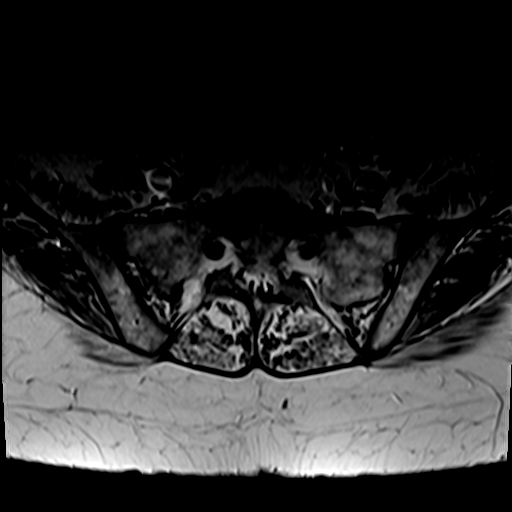
[im 5/30]
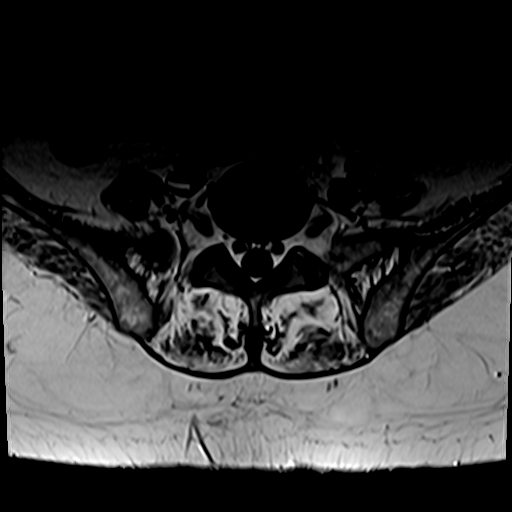
[im 9/30]
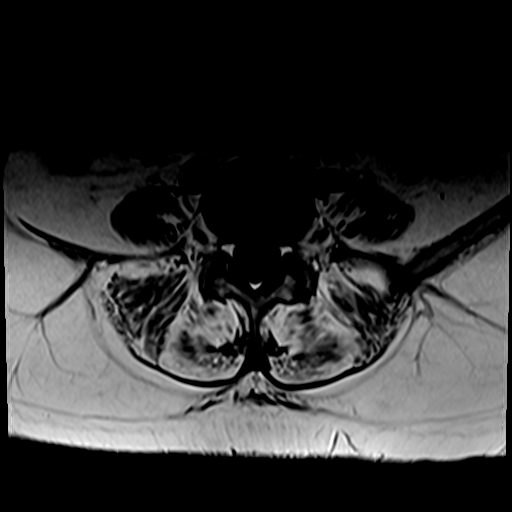
[im 14/30]
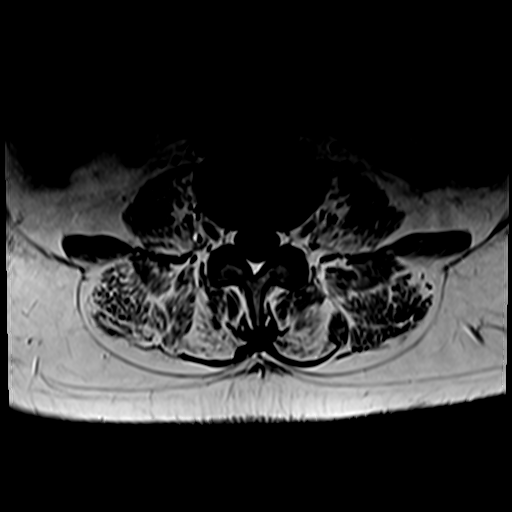
[im 16/30]
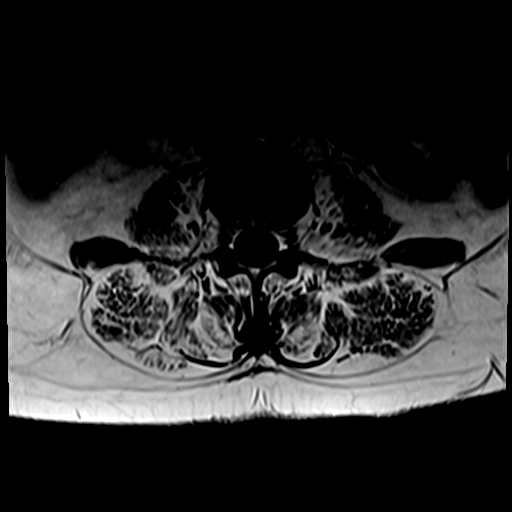
[im 21/30]
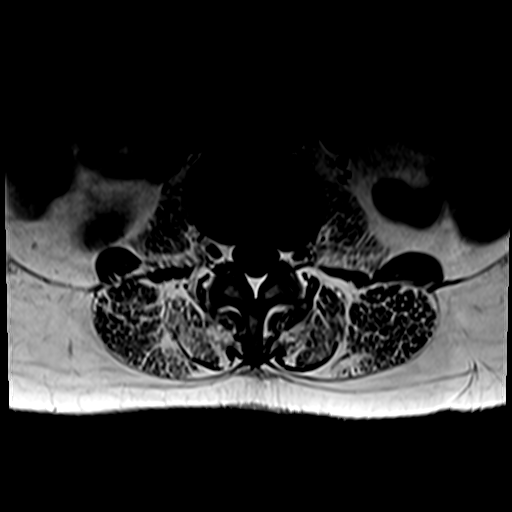
[im 25/30]
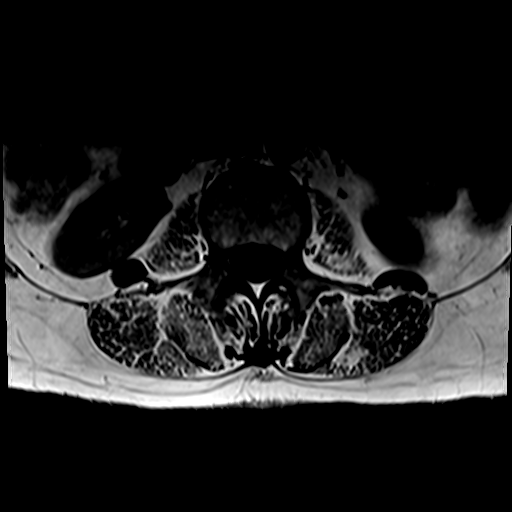
[im 30/30]
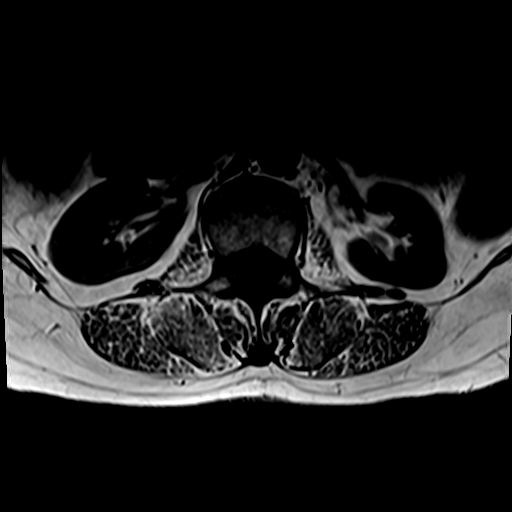

[30 of 48 positions shown; findings below may reference images not displayed]

FINDINGS: Segmentation: Standard. Lowest well-formed disc space labeled the
L5-S1 level.

Alignment: Physiologic with preservation of the normal lumbar
lordosis. No listhesis.

Vertebrae: Vertebral body height well maintained without acute or
chronic fracture. Bone marrow signal intensity within normal limits.
1 cm benign hemangioma noted within the L4 vertebral body. No other
discrete or worrisome osseous lesions. Mild discogenic reactive
endplate changes present about the L2-3 interspace. No other
abnormal marrow edema.

Conus medullaris and cauda equina: Conus extends to the L1 level.
Conus and cauda equina appear normal.

Paraspinal and other soft tissues: Paraspinous soft tissues
demonstrate no acute finding. Diffuse fatty infiltration noted
throughout the posterior paraspinous and psoas musculature.
Subcentimeter simple cyst noted within the lower right kidney.
Visualized visceral structures otherwise unremarkable.

Disc levels:

L1-2:  Unremarkable.

L2-3: Mild diffuse disc bulge with disc desiccation. Associated mild
reactive endplate spurring. Superimposed shallow left foraminal to
extraforaminal disc protrusion contacts the exiting left L2 nerve
root as it courses of the left neural foramen (series 8, image 10).
Mild facet hypertrophy. No significant spinal stenosis. Foramina
remain patent.

L3-4: Disc desiccation without disc bulge. Mild facet hypertrophy.
No canal or foraminal stenosis.

L4-5: Disc desiccation without disc bulge. Mild to moderate facet
hypertrophy. No canal or foraminal stenosis.

L5-S1: Disc desiccation without disc bulge. Mild epidural
lipomatosis. No canal or foraminal stenosis.
IMPRESSION: 1. Shallow left foraminal to extraforaminal disc protrusion at L2-3,
contacting and potentially irritating the exiting left L2 nerve
root.
2. Mild to moderate facet hypertrophy at L3-4 through L5-S1.
3. No other significant disc pathology within the lumbar spine. No
significant stenosis or evidence for neural impingement. No cord
compression.

## 2022-08-19 NOTE — Progress Notes (Deleted)
Psychiatric Initial Adult Assessment   Patient Identification: Tara Scott MRN:  [id number] Date of Evaluation:  08/19/2022 Referral Source: *** Chief Complaint:  No chief complaint on file.  Visit Diagnosis: No diagnosis found.  History of Present Illness:   Tara Scott is a 61 y.o. year old female with a history of depression, anxiety, post ablasive hypothyroidism with history of multinodular goiter, malignant hyperthermia, GERD, who is referred for depression.         Associated Signs/Symptoms: Depression Symptoms:  {DEPRESSION SYMPTOMS:20000} (Hypo) Manic Symptoms:  {BHH MANIC SYMPTOMS:22872} Anxiety Symptoms:  {BHH ANXIETY SYMPTOMS:22873} Psychotic Symptoms:  {BHH PSYCHOTIC SYMPTOMS:22874} PTSD Symptoms: {BHH PTSD SYMPTOMS:22875}  Past Psychiatric History:  Outpatient:  Psychiatry admission:  Previous suicide attempt:  Past trials of medication:  History of violence:  History of head injury:   Previous Psychotropic Medications: {YES/NO:21197}  Substance Abuse History in the last 12 months:  {yes no:314532}  Consequences of Substance Abuse: {BHH CONSEQUENCES OF SUBSTANCE ABUSE:22880}  Past Medical History:  Past Medical History:  Diagnosis Date   Adverse effect of malignant hyperthermia 04/16/2010   Anxiety    Closed fracture of spine at C1-C4 level with central cervical cord lesion form of MD   DVT (deep venous thrombosis) ([place])    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    Malignant hyperthermia    Toxic multinodular goiter     Past Surgical History:  Procedure Laterality Date   ADENOIDECTOMY     CESAREAN SECTION     COLONOSCOPY WITH PROPOFOL N/A 03/08/2016   Procedure: COLONOSCOPY WITH PROPOFOL;  Surgeon: Manya Silvas, MD;  Location: [facility] ENDOSCOPY;  Service: Endoscopy;  Laterality: N/A;   LAPAROTOMY  1984   OVARIAN CYST   TOTAL  HIP ARTHROPLASTY Right 04/23/2021   Procedure: TOTAL HIP ARTHROPLASTY ANTERIOR APPROACH;  Surgeon: Hessie Knows, MD;  Location: [facility] ORS;  Service: Orthopedics;  Laterality: Right;    Family Psychiatric History: ***  Family History:  Family History  Problem Relation Age of Onset   CVA Mother    Hypercholesterolemia Mother    Depression Mother    Rheum arthritis Mother    Deep vein thrombosis Mother    Bladder Cancer Father    Diverticulitis Father    Pulmonary embolism Father    Lung cancer Father    Deep vein thrombosis Sister    Colon polyps Sister    Atrial fibrillation Sister    Muscular dystrophy Sister    Cancer Sister        lung spreading all over   Diabetes Son        type 2    Social History:   Social History   Socioeconomic History   Marital status: Widowed    Spouse name: Not on file   Number of children: 1   Years of education: Not on file   Highest education level: High school graduate  Occupational History   Occupation: disability  Tobacco Use   Smoking status: Every Day    Packs/day: 0.50    Years: 40.00    Total pack years: 20.00    Types: Cigarettes   Smokeless tobacco: Never  Vaping Use   Vaping Use: Never used  Substance and Sexual Activity   Alcohol use: No    Alcohol/week: 0.0 standard drinks of alcohol   Drug use: No   Sexual activity: Not on file  Other  Topics Concern   Not on file  Social History Narrative   Not on file   Social Determinants of Health   Financial Resource Strain: Low Risk  (10/15/2020)   Overall Financial Resource Strain (CARDIA)    Difficulty of Paying Living Expenses: Not very hard  Food Insecurity: No Food Insecurity (12/10/2021)   Hunger Vital Sign    Worried About Running Out of Food in the Last Year: Never true    Ran Out of Food in the Last Year: Never true  Transportation Needs: No Transportation Needs (12/10/2021)   PRAPARE - Hydrologist (Medical): No    Lack of  Transportation (Non-Medical): No  Physical Activity: Inactive (10/16/2020)   Exercise Vital Sign    Days of Exercise per Week: 0 days    Minutes of Exercise per Session: 0 min  Stress: Stress Concern Present (12/10/2021)   [place]    Feeling of Stress : Rather much  Social Connections: Socially Isolated (10/16/2020)   Social Connection and Isolation Panel [NHANES]    Frequency of Communication with Friends and Family: Once a week    Frequency of Social Gatherings with Friends and Family: Never    Attends Religious Services: Never    Marine scientist or Organizations: No    Attends Archivist Meetings: Never    Marital Status: Widowed    Additional Social History: ***  Allergies:   Allergies  Allergen Reactions   Anesthetics, Halogenated Other (See Comments)    Other reaction(s): Other (See Comments)   Ciprofloxacin     swelling   Methimazole     hives   Oxycodone-Acetaminophen Nausea And Vomiting   Penicillins Other (See Comments)    RXN AS A CHILD    Metabolic Disorder Labs: No results found for: "HGBA1C", "MPG" No results found for: "PROLACTIN" Lab Results  Component Value Date   CHOL 197 10/05/2017   TRIG 120 10/05/2017   HDL 65 10/05/2017   CHOLHDL 3.0 10/05/2017   LDLCALC 108 (H) 10/05/2017   Lab Results  Component Value Date   TSH 4.168 04/23/2021    Therapeutic Level Labs: No results found for: "LITHIUM" No results found for: "CBMZ" No results found for: "VALPROATE"  Current Medications: Current Outpatient Medications  Medication Sig Dispense Refill   cholecalciferol (VITAMIN D3) 25 MCG (1000 UNIT) tablet Take 1,000 Units by mouth daily.     citalopram (CELEXA) 40 MG tablet TAKE 1 TABLET BY MOUTH EVERY DAY 90 tablet 0   docusate sodium (COLACE) 100 MG capsule Take 1 capsule (100 mg total) by mouth 2 (two) times daily. 10 capsule 0   hydrocortisone 2.5 % ointment Apply  topically.     levothyroxine (SYNTHROID) 100 MCG tablet TAKE 1 TABLET BY MOUTH EVERY DAY BEFORE BREAKFAST 90 tablet 1   omeprazole (PRILOSEC) 20 MG capsule TAKE 1 CAPSULE BY MOUTH EVERY DAY 90 capsule 0   No current facility-administered medications for this visit.    Musculoskeletal: Strength & Muscle Tone: within normal limits Gait & Station: normal Patient leans: N/A  Psychiatric Specialty Exam: Review of Systems  There were no vitals taken for this visit.There is no height or weight on file to calculate BMI.  General Appearance: {Appearance:22683}  Eye Contact:  {BHH EYE CONTACT:22684}  Speech:  Clear and Coherent  Volume:  Normal  Mood:  {BHH MOOD:22306}  Affect:  {Affect (PAA):22687}  Thought Process:  Coherent  Orientation:  Full (Time, Place, and Person)  Thought Content:  Logical  Suicidal Thoughts:  {ST/HT (PAA):22692}  Homicidal Thoughts:  {ST/HT (PAA):22692}  Memory:  Immediate;   Good  Judgement:  {Judgement (PAA):22694}  Insight:  {Insight (PAA):22695}  Psychomotor Activity:  Normal  Concentration:  Concentration: Good and Attention Span: Good  Recall:  Good  Fund of Knowledge:Good  Language: Good  Akathisia:  No  Handed:  Right  AIMS (if indicated):  not done  Assets:  Communication Skills Desire for Improvement  ADL's:  Intact  Cognition: WNL  Sleep:  {BHH GOOD/FAIR/POOR:22877}   Screenings: GAD-7    Flowsheet Row Office Visit from 12/02/2021 in [place] Visit from 09/04/2019 in [place]  Total GAD-7 Score 6 17      PHQ2-9    [place] Management from 12/10/2021 in [place] Visit from 12/02/2021 in [place] Visit from 11/13/2020 in [place] Visit from 11/03/2020 in [place] Management from 10/28/2020 in [place]  PHQ-2 Total Score '4 4 4 6 6  '$ PHQ-9 Total Score '14 14 7 14 16       '$ Flowsheet Row ED to Hosp-Admission (Discharged) from 04/22/2021 in [place] (1A)  C-SSRS RISK CATEGORY No Risk       Assessment and Plan:  Assessment  Plan   The patient demonstrates the following risk factors for suicide: Chronic risk factors for suicide include: {Chronic Risk Factors for RFXJOIT:25498264}. Acute risk factors for suicide include: {Acute Risk Factors for BRAXENM:07680881}. Protective factors for this patient include: {Protective Factors for Suicide JSRP:59458592}. Considering these factors, the overall suicide risk at this point appears to be {Desc; low/moderate/high:110033}. Patient {ACTION; IS/IS TWK:46286381} appropriate for outpatient follow up.   Collaboration of Care: {BH OP Collaboration of Care:21014065}  Patient/Guardian was advised Release of Information must be obtained prior to any record release in order to collaborate their care with an outside provider. Patient/Guardian was advised if they have not already done so to contact the registration department to sign all necessary forms in order for [place] to release information regarding their care.   Consent: Patient/Guardian gives verbal consent for treatment and assignment of benefits for services provided during this visit. Patient/Guardian expressed understanding and agreed to proceed.   Norman Clay, MD 11/30/20233:56 PM

## 2022-08-23 ENCOUNTER — Ambulatory Visit: Payer: PPO | Admitting: Psychiatry

## 2022-08-30 ENCOUNTER — Telehealth: Payer: Self-pay | Admitting: Family Medicine

## 2022-08-30 NOTE — Telephone Encounter (Signed)
Left message for patient to call back and schedule Medicare Annual Wellness Visit (AWV) in office.   If not able to come in office, please offer to do virtually or by telephone.  Left office number and my jabber [phone].  Last AWV:10/16/2020   Please schedule at anytime with Nurse Health Advisor.

## 2022-09-09 ENCOUNTER — Telehealth: Payer: Self-pay | Admitting: Family Medicine

## 2022-09-09 NOTE — Telephone Encounter (Signed)
Left message for patient to call back and schedule Medicare Annual Wellness Visit (AWV) in office.   If not able to come in office, please offer to do virtually or by telephone.  Left office number and my jabber [phone].  Last AWV:10/16/2020   Please schedule at anytime with Nurse Health Advisor.

## 2022-11-04 DIAGNOSIS — H6123 Impacted cerumen, bilateral: Secondary | ICD-10-CM | POA: Diagnosis not present

## 2022-11-04 DIAGNOSIS — H6521 Chronic serous otitis media, right ear: Secondary | ICD-10-CM | POA: Diagnosis not present

## 2022-11-13 ENCOUNTER — Other Ambulatory Visit: Payer: Self-pay | Admitting: Family

## 2022-12-14 ENCOUNTER — Encounter: Payer: Self-pay | Admitting: Internal Medicine

## 2022-12-14 ENCOUNTER — Ambulatory Visit (INDEPENDENT_AMBULATORY_CARE_PROVIDER_SITE_OTHER): Payer: PPO | Admitting: Internal Medicine

## 2022-12-14 VITALS — BP 120/60 | HR 64 | Ht 63.0 in | Wt 148.2 lb

## 2022-12-14 DIAGNOSIS — E890 Postprocedural hypothyroidism: Secondary | ICD-10-CM

## 2022-12-14 DIAGNOSIS — G7100 Muscular dystrophy, unspecified: Secondary | ICD-10-CM | POA: Diagnosis not present

## 2022-12-14 DIAGNOSIS — F329 Major depressive disorder, single episode, unspecified: Secondary | ICD-10-CM | POA: Diagnosis not present

## 2022-12-14 DIAGNOSIS — M79604 Pain in right leg: Secondary | ICD-10-CM | POA: Diagnosis not present

## 2022-12-14 DIAGNOSIS — M79605 Pain in left leg: Secondary | ICD-10-CM | POA: Insufficient documentation

## 2022-12-14 DIAGNOSIS — I739 Peripheral vascular disease, unspecified: Secondary | ICD-10-CM | POA: Diagnosis not present

## 2022-12-14 DIAGNOSIS — K219 Gastro-esophageal reflux disease without esophagitis: Secondary | ICD-10-CM | POA: Diagnosis not present

## 2022-12-14 NOTE — Progress Notes (Signed)
Established Patient Office Visit  Subjective:  Patient ID: Tara Scott, female    DOB: December 21, 1960  Age: 62 y.o. MRN: [id number]  Chief Complaint  Patient presents with   Follow-up    Bilteral leg swelling    Patient has not been in since September of last year.  She canceled her complete physical exam and December.  Today she comes in with complaints of bilateral leg discomfort.  She has history of muscular dystrophy but she notes that occasionally both her ankles seem swollen, but they are not today.  But also feels them to be cold at times and the skin looking very tight on them.  She is concerned about peripheral arterial disease as her mother also suffered from it.  She is due for blood work.    No other concerns at this time.   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    Malignant hyperthermia    Toxic multinodular goiter     Past Surgical History:  Procedure Laterality Date   ADENOIDECTOMY     CESAREAN SECTION     COLONOSCOPY WITH PROPOFOL N/A 03/08/2016   Procedure: COLONOSCOPY WITH PROPOFOL;  Surgeon: Manya Silvas, MD;  Location: [facility] ENDOSCOPY;  Service: Endoscopy;  Laterality: N/A;   LAPAROTOMY  1984   OVARIAN CYST   TOTAL HIP ARTHROPLASTY Right 04/23/2021   Procedure: TOTAL HIP ARTHROPLASTY ANTERIOR APPROACH;  Surgeon: Hessie Knows, MD;  Location: [facility] ORS;  Service: Orthopedics;  Laterality: Right;    Social History   Socioeconomic History   Marital status: Widowed    Spouse name: Not on file   Number of children: 1   Years of education: Not on file   Highest education level: High school graduate  Occupational  History   Occupation: disability  Tobacco Use   Smoking status: Every Day    Packs/day: 0.50    Years: 40.00    Additional pack years: 0.00    Total pack years: 20.00    Types: Cigarettes   Smokeless tobacco: Never  Vaping Use   Vaping Use: Never used  Substance and Sexual Activity   Alcohol use: No    Alcohol/week: 0.0 standard drinks of alcohol   Drug use: No   Sexual activity: Not on file  Other Topics Concern   Not on file  Social History Narrative   Not on file   Social Determinants of Health   Financial Resource Strain: Low Risk  (10/15/2020)   Overall Financial Resource Strain (CARDIA)    Difficulty of Paying Living Expenses: Not very hard  Food Insecurity: No Food Insecurity (12/10/2021)   Hunger Vital Sign    Worried About Running Out of Food in the Last Year: Never true    Ran Out of Food in the Last Year: Never true  Transportation Needs: No Transportation Needs (12/10/2021)   PRAPARE - Hydrologist (Medical): No    Lack of Transportation (Non-Medical): No  Physical Activity: Inactive (10/16/2020)   Exercise Vital Sign    Days of Exercise per Week: 0 days    Minutes of Exercise per Session: 0 min  Stress: Stress Concern Present (12/10/2021)   [place]    Feeling of Stress : Rather much  Social Connections: Socially Isolated (10/16/2020)   Social Connection and Isolation Panel [NHANES]    Frequency of Communication with Friends and Family: Once a week    Frequency of Social Gatherings with Friends and Family: Never    Attends Religious Services: Never    Marine scientist or Organizations: No    Attends Archivist Meetings: Never    Marital Status: Widowed  Intimate Partner Violence: Not At Risk (10/16/2020)   Humiliation, Afraid, Rape, and Kick questionnaire    Fear of Current or Ex-Partner: No    Emotionally Abused: No    Physically Abused: No     Sexually Abused: No    Family History  Problem Relation Age of Onset   CVA Mother    Hypercholesterolemia Mother    Depression Mother    Rheum arthritis Mother    Deep vein thrombosis Mother    Bladder Cancer Father    Diverticulitis Father    Pulmonary embolism Father    Lung cancer Father    Deep vein thrombosis Sister    Colon polyps Sister    Atrial fibrillation Sister    Muscular dystrophy Sister    Cancer Sister        lung spreading all over   Diabetes Son        type 2    Allergies  Allergen Reactions   Anesthetics, Halogenated Other (See Comments)    Other reaction(s): Other (See Comments)   Ciprofloxacin     swelling   Methimazole     hives   Oxycodone-Acetaminophen Nausea And Vomiting   Penicillins Other (See Comments)    RXN AS A CHILD    Review of Systems  Constitutional:  Positive for malaise/fatigue. Negative for diaphoresis, fever and weight loss.  HENT: Negative.    Eyes: Negative.   Respiratory: Negative.    Cardiovascular: Negative.   Gastrointestinal: Negative.   Genitourinary: Negative.   Musculoskeletal:  Positive for myalgias.  Skin:  Negative for rash.  Neurological: Negative.   Psychiatric/Behavioral:  The patient is nervous/anxious.        Objective:   BP 120/60   Pulse 64   Ht 5\' 3"  (1.6 m)   Wt 148 lb 3.2 oz (67.2 kg)   SpO2 99%   BMI 26.25 kg/m   Vitals:   12/14/22 1059  BP: 120/60  Pulse: 64  Height: 5\' 3"  (1.6 m)  Weight: 148 lb 3.2 oz (67.2 kg)  SpO2: 99%  BMI (Calculated): 26.26    Physical Exam Vitals and nursing note reviewed.  Constitutional:      Appearance: Normal appearance.  Cardiovascular:     Rate and Rhythm: Normal rate and regular rhythm.  Pulmonary:     Effort: Pulmonary effort is normal.     Breath sounds: Normal breath sounds.  Musculoskeletal:        General: No swelling, tenderness, deformity or signs of injury.     Cervical back: Normal range of motion.     Right lower leg: No edema.      Left lower leg: No edema.  Skin:    General: Skin is warm.     Coloration: Skin is not jaundiced or pale.     Findings: No bruising, erythema, lesion or rash.  Neurological:     General: No focal deficit present.  Mental Status: She is alert and oriented to person, place, and time.      No results found for any visits on 12/14/22.  No results found for this or any previous visit (from the past 2160 hour(s)).    Assessment & Plan:  Schedule ABI. Check labs today.  Patient encouraged to take her medications as prescribed. Problem List Items Addressed This Visit     MDD (major depressive disorder)   Relevant Medications   citalopram (CELEXA) 20 MG tablet   busPIRone (BUSPAR) 15 MG tablet   Muscular dystrophy ([place])   Relevant Orders   CMP14+EGFR   Postablative hypothyroidism   Relevant Orders   TSH+T4F+T3Free   Lipid Panel w/o Chol/HDL Ratio   Gastroesophageal reflux disease   Relevant Orders   CBC With Differential   Pain in both lower extremities   Relevant Orders   Lipid Panel w/o Chol/HDL Ratio   CMP14+EGFR   Other Visit Diagnoses     PAD (peripheral artery disease) ([place])    -  Primary   Relevant Orders   Lipid Panel w/o Chol/HDL Ratio   VAS [place] ABI WITH/WO TBI       Return in about 3 weeks (around 01/04/2023).   Total time spent: 30 minutes  Perrin Maltese, MD  12/14/2022

## 2022-12-15 LAB — CBC WITH DIFFERENTIAL
Basophils Absolute: 0.1 10*3/uL (ref 0.0–0.2)
Basos: 1 %
EOS (ABSOLUTE): 0 10*3/uL (ref 0.0–0.4)
Eos: 1 %
Hematocrit: 41.5 % (ref 34.0–46.6)
Hemoglobin: 13.3 g/dL (ref 11.1–15.9)
Immature Grans (Abs): 0 10*3/uL (ref 0.0–0.1)
Immature Granulocytes: 0 %
Lymphocytes Absolute: 1.6 10*3/uL (ref 0.7–3.1)
Lymphs: 24 %
MCH: 30.6 pg (ref 26.6–33.0)
MCHC: 32 g/dL (ref 31.5–35.7)
MCV: 95 fL (ref 79–97)
Monocytes Absolute: 0.6 10*3/uL (ref 0.1–0.9)
Monocytes: 9 %
Neutrophils Absolute: 4.5 10*3/uL (ref 1.4–7.0)
Neutrophils: 65 %
RBC: 4.35 x10E6/uL (ref 3.77–5.28)
RDW: 12.6 % (ref 11.7–15.4)
WBC: 6.8 10*3/uL (ref 3.4–10.8)

## 2022-12-15 LAB — TSH+T4F+T3FREE
Free T4: 1.71 ng/dL (ref 0.82–1.77)
T3, Free: 2.8 pg/mL (ref 2.0–4.4)
TSH: 1.77 u[IU]/mL (ref 0.450–4.500)

## 2022-12-15 LAB — LIPID PANEL W/O CHOL/HDL RATIO
Cholesterol, Total: 203 mg/dL — ABNORMAL HIGH (ref 100–199)
HDL: 56 mg/dL (ref >39)
LDL Chol Calc (NIH): 131 mg/dL — ABNORMAL HIGH (ref 0–99)
Triglycerides: 89 mg/dL (ref 0–149)
VLDL Cholesterol Cal: 16 mg/dL (ref 5–40)

## 2022-12-15 LAB — CMP14+EGFR
ALT: 20 IU/L (ref 0–32)
AST: 17 IU/L (ref 0–40)
Albumin/Globulin Ratio: 1.8 (ref 1.2–2.2)
Albumin: 4.2 g/dL (ref 3.9–4.9)
Alkaline Phosphatase: 92 IU/L (ref 44–121)
BUN/Creatinine Ratio: 26 (ref 12–28)
BUN: 10 mg/dL (ref 8–27)
Bilirubin Total: 0.3 mg/dL (ref 0.0–1.2)
CO2: 23 mmol/L (ref 20–29)
Calcium: 9.3 mg/dL (ref 8.7–10.3)
Chloride: 102 mmol/L (ref 96–106)
Creatinine, Ser: 0.38 mg/dL — ABNORMAL LOW (ref 0.57–1.00)
Globulin, Total: 2.3 g/dL (ref 1.5–4.5)
Glucose: 81 mg/dL (ref 70–99)
Potassium: 4.2 mmol/L (ref 3.5–5.2)
Sodium: 142 mmol/L (ref 134–144)
Total Protein: 6.5 g/dL (ref 6.0–8.5)
eGFR: 113 mL/min/{1.73_m2} (ref >59)

## 2022-12-21 ENCOUNTER — Other Ambulatory Visit: Payer: PPO

## 2022-12-21 ENCOUNTER — Ambulatory Visit (INDEPENDENT_AMBULATORY_CARE_PROVIDER_SITE_OTHER): Payer: PPO

## 2022-12-21 DIAGNOSIS — I739 Peripheral vascular disease, unspecified: Secondary | ICD-10-CM

## 2022-12-24 ENCOUNTER — Other Ambulatory Visit: Payer: Self-pay

## 2022-12-24 MED ORDER — ROSUVASTATIN CALCIUM 5 MG PO TABS: 5.0000 mg | ORAL_TABLET | Freq: Every day | ORAL | 1 refills | Status: DC

## 2023-01-03 ENCOUNTER — Other Ambulatory Visit: Payer: Self-pay | Admitting: Internal Medicine

## 2023-01-03 DIAGNOSIS — I739 Peripheral vascular disease, unspecified: Secondary | ICD-10-CM

## 2023-01-04 ENCOUNTER — Encounter: Payer: Self-pay | Admitting: Internal Medicine

## 2023-01-04 ENCOUNTER — Ambulatory Visit (INDEPENDENT_AMBULATORY_CARE_PROVIDER_SITE_OTHER): Payer: PPO | Admitting: Internal Medicine

## 2023-01-04 VITALS — BP 122/60 | HR 68 | Ht 63.0 in | Wt 145.0 lb

## 2023-01-04 DIAGNOSIS — F17200 Nicotine dependence, unspecified, uncomplicated: Secondary | ICD-10-CM | POA: Diagnosis not present

## 2023-01-04 DIAGNOSIS — M79604 Pain in right leg: Secondary | ICD-10-CM

## 2023-01-04 DIAGNOSIS — E890 Postprocedural hypothyroidism: Secondary | ICD-10-CM | POA: Diagnosis not present

## 2023-01-04 DIAGNOSIS — M79605 Pain in left leg: Secondary | ICD-10-CM | POA: Diagnosis not present

## 2023-01-04 DIAGNOSIS — G7100 Muscular dystrophy, unspecified: Secondary | ICD-10-CM

## 2023-01-04 DIAGNOSIS — I739 Peripheral vascular disease, unspecified: Secondary | ICD-10-CM

## 2023-01-04 DIAGNOSIS — E782 Mixed hyperlipidemia: Secondary | ICD-10-CM | POA: Insufficient documentation

## 2023-01-04 NOTE — Progress Notes (Signed)
Established Patient Office Visit  Subjective:  Patient ID: Tara Scott, female    DOB: 04-14-1961  Age: 62 y.o. MRN: [id number]  Chief Complaint  Patient presents with   Follow-up    3 week follow up    Patient comes in for follow-up of her bilateral leg pain especially of the left side.  She has muscular dystrophy and family history of peripheral arterial disease.  She is a regular smoker.  Her recent labs showed an elevated LDL cholesterol and a  small dose statin has been initiated. Patient had ABI done last week which showed an abnormal ABI for the left lower extremity.  A CTA aorta with runoff has been scheduled.  Patient complains of left lower leg throbbing pain as well as cold sensation of her toes. Patient advised to start working on smoking cessation, as well as to take her cholesterol medication regularly.  Patient is trying to control her diet as well.    No other concerns at this time.   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    Malignant hyperthermia    Toxic multinodular goiter     Past Surgical History:  Procedure Laterality Date   ADENOIDECTOMY     CESAREAN SECTION     COLONOSCOPY WITH PROPOFOL N/A 03/08/2016   Procedure: COLONOSCOPY WITH PROPOFOL;  Surgeon: Scot Jun, MD;  Location: [facility] ENDOSCOPY;  Service: Endoscopy;  Laterality: N/A;   LAPAROTOMY  1984   OVARIAN CYST   TOTAL HIP ARTHROPLASTY Right 04/23/2021   Procedure: TOTAL HIP ARTHROPLASTY ANTERIOR APPROACH;  Surgeon: Kennedy Bucker, MD;  Location: [facility] ORS;  Service: Orthopedics;  Laterality: Right;    Social History   Socioeconomic History   Marital  status: Widowed    Spouse name: Not on file   Number of children: 1   Years of education: Not on file   Highest education level: High school graduate  Occupational History   Occupation: disability  Tobacco Use   Smoking status: Every Day    Packs/day: 0.50    Years: 40.00    Additional pack years: 0.00    Total pack years: 20.00    Types: Cigarettes   Smokeless tobacco: Never  Vaping Use   Vaping Use: Never used  Substance and Sexual Activity   Alcohol use: No    Alcohol/week: 0.0 standard drinks of alcohol   Drug use: No   Sexual activity: Not on file  Other Topics Concern   Not on file  Social History Narrative   Not on file   Social Determinants of Health   Financial Resource Strain: Low Risk  (10/15/2020)   Overall Financial Resource Strain (CARDIA)    Difficulty of Paying Living Expenses: Not very hard  Food Insecurity: No Food Insecurity (12/10/2021)   Hunger Vital Sign    Worried About Running Out of Food in the Last Year: Never true    Ran Out of Food in the Last Year: Never true  Transportation Needs: No Transportation Needs (12/10/2021)   PRAPARE - Administrator, Civil Service (Medical): No    Lack of Transportation (Non-Medical): No  Physical  Activity: Inactive (10/16/2020)   Exercise Vital Sign    Days of Exercise per Week: 0 days    Minutes of Exercise per Session: 0 min  Stress: Stress Concern Present (12/10/2021)   [employer] of Occupational Health - Occupational Stress Questionnaire    Feeling of Stress : Rather much  Social Connections: Socially Isolated (10/16/2020)   Social Connection and Isolation Panel [NHANES]    Frequency of Communication with Friends and Family: Once a week    Frequency of Social Gatherings with Friends and Family: Never    Attends Religious Services: Never    Database administrator or Organizations: No    Attends Banker Meetings: Never    Marital Status: Widowed  Intimate Partner Violence: Not  At Risk (10/16/2020)   Humiliation, Afraid, Rape, and Kick questionnaire    Fear of Current or Ex-Partner: No    Emotionally Abused: No    Physically Abused: No    Sexually Abused: No    Family History  Problem Relation Age of Onset   CVA Mother    Hypercholesterolemia Mother    Depression Mother    Rheum arthritis Mother    Deep vein thrombosis Mother    Bladder Cancer Father    Diverticulitis Father    Pulmonary embolism Father    Lung cancer Father    Deep vein thrombosis Sister    Colon polyps Sister    Atrial fibrillation Sister    Muscular dystrophy Sister    Cancer Sister        lung spreading all over   Diabetes Son        type 2    Allergies  Allergen Reactions   Anesthetics, Halogenated Other (See Comments)    Other reaction(s): Other (See Comments)   Ciprofloxacin     swelling   Methimazole     hives   Oxycodone-Acetaminophen Nausea And Vomiting   Penicillins Other (See Comments)    RXN AS A CHILD    Review of Systems  Constitutional:  Negative for chills, diaphoresis, fever, malaise/fatigue and weight loss.  HENT: Negative.    Eyes: Negative.   Respiratory:  Negative for shortness of breath.   Cardiovascular:  Positive for claudication. Negative for chest pain, palpitations, leg swelling and PND.  Gastrointestinal: Negative.   Genitourinary: Negative.   Musculoskeletal:  Positive for myalgias. Negative for back pain, joint pain and neck pain.  Skin: Negative.   Neurological:  Positive for weakness. Negative for dizziness, sensory change and headaches.  Psychiatric/Behavioral:  Negative for depression, substance abuse and suicidal ideas. The patient is nervous/anxious.        Objective:   BP 122/60   Pulse 68   Ht  (1.6 m)   Wt 145 lb (65.8 kg)   SpO2 97%   BMI 25.69 kg/m   Vitals:   01/04/23 1422  BP: 122/60  Pulse: 68  Height:  (1.6 m)  Weight: 145 lb (65.8 kg)  SpO2: 97%  BMI (Calculated): 25.69    Physical  Exam Vitals and nursing note reviewed.  Constitutional:      Appearance: Normal appearance.  HENT:     Head: Normocephalic and atraumatic.  Cardiovascular:     Rate and Rhythm: Normal rate and regular rhythm.     Pulses: Normal pulses.     Heart sounds: Normal heart sounds.  Pulmonary:     Effort: Pulmonary effort is normal.     Breath sounds: Normal breath sounds.  Abdominal:     General: Abdomen is flat. Bowel sounds are normal.     Palpations: Abdomen is soft.  Musculoskeletal:        General: Tenderness present.     Cervical back: Normal range of motion and neck supple.     Right lower leg: No edema.     Left lower leg: No edema.  Skin:    General: Skin is warm and dry.  Neurological:     General: No focal deficit present.     Mental Status: She is alert and oriented to person, place, and time.  Psychiatric:        Mood and Affect: Mood normal.        Behavior: Behavior normal.      No results found for any visits on 01/04/23.      Assessment & Plan:  Patient to proceed with a CT angiogram of the aorta with runoff.  Also to start taking her Crestor.  Smoking cessation discussed.  Patient will continue taking rest of her medications as such. Problem List Items Addressed This Visit     Muscular dystrophy (HCC)   Postablative hypothyroidism   Tobacco use disorder   Pain in both lower extremities - Primary   Mixed hyperlipidemia   Other Visit Diagnoses     PAD (peripheral artery disease)           Return in about 4 weeks (around 02/01/2023).   Total time spent: 30 minutes  Margaretann Loveless, MD  01/04/2023

## 2023-01-11 ENCOUNTER — Ambulatory Visit (INDEPENDENT_AMBULATORY_CARE_PROVIDER_SITE_OTHER): Payer: PPO

## 2023-01-11 DIAGNOSIS — I739 Peripheral vascular disease, unspecified: Secondary | ICD-10-CM

## 2023-01-11 MED ORDER — IOHEXOL 350 MG/ML SOLN
125.0000 mL | Freq: Once | INTRAVENOUS | Status: AC | PRN
  Administered 2023-01-11: 125 mL via INTRAVENOUS

## 2023-01-20 ENCOUNTER — Telehealth: Payer: Self-pay | Admitting: Internal Medicine

## 2023-01-28 NOTE — Telephone Encounter (Signed)
Entered in error

## 2023-02-01 ENCOUNTER — Ambulatory Visit: Payer: PPO | Admitting: Internal Medicine

## 2023-02-03 ENCOUNTER — Ambulatory Visit (INDEPENDENT_AMBULATORY_CARE_PROVIDER_SITE_OTHER): Payer: PPO | Admitting: Internal Medicine

## 2023-02-03 ENCOUNTER — Encounter: Payer: Self-pay | Admitting: Internal Medicine

## 2023-02-03 VITALS — BP 120/80 | HR 67 | Ht 63.0 in | Wt 143.0 lb

## 2023-02-03 DIAGNOSIS — F329 Major depressive disorder, single episode, unspecified: Secondary | ICD-10-CM | POA: Diagnosis not present

## 2023-02-03 DIAGNOSIS — M5416 Radiculopathy, lumbar region: Secondary | ICD-10-CM | POA: Diagnosis not present

## 2023-02-03 DIAGNOSIS — E782 Mixed hyperlipidemia: Secondary | ICD-10-CM | POA: Diagnosis not present

## 2023-02-03 DIAGNOSIS — F17200 Nicotine dependence, unspecified, uncomplicated: Secondary | ICD-10-CM

## 2023-02-03 DIAGNOSIS — K219 Gastro-esophageal reflux disease without esophagitis: Secondary | ICD-10-CM

## 2023-02-03 DIAGNOSIS — G7100 Muscular dystrophy, unspecified: Secondary | ICD-10-CM

## 2023-02-03 MED ORDER — METHYLPREDNISOLONE 4 MG PO TBPK: ORAL_TABLET | ORAL | 0 refills | Status: DC

## 2023-02-03 NOTE — Progress Notes (Signed)
Established Patient Office Visit  Subjective:  Patient ID: Tara Scott, female    DOB: 25-Oct-1960  Age: 62 y.o. MRN: [id number]  Chief Complaint  Patient presents with   Follow-up    4 week follow up    Patient comes in with a friend today to discuss the results of her CTA aorta with runoff.  Results shows patent arteries with good blood flow.  Patient has been complaining of left lower extremity pain and numbness and was concerned about peripheral arterial disease.  Patient has history of high cholesterol but has been reluctant to use statins.  At the same time she continues to smoke cigarettes. Patient also has history of lumbar spondylosis and the symptoms could be related to lumbar radiculopathy.  Will we will try Medrol Dosepak and see if there is any improvement in her symptoms.    No other concerns at this time.   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    Malignant hyperthermia    Toxic multinodular goiter     Past Surgical History:  Procedure Laterality Date   ADENOIDECTOMY     CESAREAN SECTION     COLONOSCOPY WITH PROPOFOL N/A 03/08/2016   Procedure: COLONOSCOPY WITH PROPOFOL;  Surgeon: Scot Jun, MD;  Location: [facility] ENDOSCOPY;  Service: Endoscopy;  Laterality: N/A;   LAPAROTOMY  1984   OVARIAN CYST   TOTAL HIP ARTHROPLASTY Right 04/23/2021   Procedure: TOTAL HIP ARTHROPLASTY ANTERIOR APPROACH;  Surgeon: Kennedy Bucker, MD;  Location: [facility] ORS;  Service: Orthopedics;  Laterality: Right;    Social History   Socioeconomic History   Marital status: Widowed    Spouse name: Not on file   Number of children: 1   Years  of education: Not on file   Highest education level: High school graduate  Occupational History   Occupation: disability  Tobacco Use   Smoking status: Every Day    Packs/day: 0.50    Years: 40.00    Additional pack years: 0.00    Total pack years: 20.00    Types: Cigarettes   Smokeless tobacco: Never  Vaping Use   Vaping Use: Never used  Substance and Sexual Activity   Alcohol use: No    Alcohol/week: 0.0 standard drinks of alcohol   Drug use: No   Sexual activity: Not on file  Other Topics Concern   Not on file  Social History Narrative   Not on file   Social Determinants of Health   Financial Resource Strain: Low Risk  (10/15/2020)   Overall Financial Resource Strain (CARDIA)    Difficulty of Paying Living Expenses: Not very hard  Food Insecurity: No Food Insecurity (12/10/2021)   Hunger Vital Sign    Worried About Running Out of Food in the Last Year: Never true    Ran Out of Food in the Last Year: Never true  Transportation Needs: No Transportation Needs (12/10/2021)   PRAPARE - Administrator, Civil Service (Medical): No    Lack of Transportation (Non-Medical): No  Physical Activity: Inactive (10/16/2020)   Exercise Vital Sign    Days of Exercise per Week: 0  days    Minutes of Exercise per Session: 0 min  Stress: Stress Concern Present (12/10/2021)   [employer] of Occupational Health - Occupational Stress Questionnaire    Feeling of Stress : Rather much  Social Connections: Socially Isolated (10/16/2020)   Social Connection and Isolation Panel [NHANES]    Frequency of Communication with Friends and Family: Once a week    Frequency of Social Gatherings with Friends and Family: Never    Attends Religious Services: Never    Database administrator or Organizations: No    Attends Banker Meetings: Never    Marital Status: Widowed  Intimate Partner Violence: Not At Risk (10/16/2020)   Humiliation, Afraid, Rape, and Kick questionnaire     Fear of Current or Ex-Partner: No    Emotionally Abused: No    Physically Abused: No    Sexually Abused: No    Family History  Problem Relation Age of Onset   CVA Mother    Hypercholesterolemia Mother    Depression Mother    Rheum arthritis Mother    Deep vein thrombosis Mother    Bladder Cancer Father    Diverticulitis Father    Pulmonary embolism Father    Lung cancer Father    Deep vein thrombosis Sister    Colon polyps Sister    Atrial fibrillation Sister    Muscular dystrophy Sister    Cancer Sister        lung spreading all over   Diabetes Son        type 2    Allergies  Allergen Reactions   Anesthetics, Halogenated Other (See Comments)    Other reaction(s): Other (See Comments)   Ciprofloxacin     swelling   Methimazole     hives   Oxycodone-Acetaminophen Nausea And Vomiting   Penicillins Other (See Comments)    RXN AS A CHILD    ROS     Objective:   BP 120/80   Pulse 67   Ht 5\' 3"  (1.6 m)   Wt 143 lb (64.9 kg)   SpO2 100%   BMI 25.33 kg/m   Vitals:   02/03/23 1404  BP: 120/80  Pulse: 67  Height: 5\' 3"  (1.6 m)  Weight: 143 lb (64.9 kg)  SpO2: 100%  BMI (Calculated): 25.34    Physical Exam   No results found for any visits on 02/03/23.  Recent Results (from the past 2160 hour(s))  TSH+T4F+T3Free     Status: None   Collection Time: 12/14/22 12:00 PM  Result Value Ref Range   TSH 1.770 0.450 - 4.500 uIU/mL   T3, Free 2.8 2.0 - 4.4 pg/mL   Free T4 1.71 0.82 - 1.77 ng/dL  Lipid Panel w/o Chol/HDL Ratio     Status: Abnormal   Collection Time: 12/14/22 12:00 PM  Result Value Ref Range   Cholesterol, Total 203 (H) 100 - 199 mg/dL   Triglycerides 89 0 - 149 mg/dL   HDL 56 >16 mg/dL   VLDL Cholesterol Cal 16 5 - 40 mg/dL   LDL Chol Calc (NIH) 109 (H) 0 - 99 mg/dL  CBC With Differential     Status: None   Collection Time: 12/14/22 12:00 PM  Result Value Ref Range   WBC 6.8 3.4 - 10.8 x10E3/uL   RBC 4.35 3.77 - 5.28 x10E6/uL    Hemoglobin 13.3 11.1 - 15.9 g/dL   Hematocrit 60.4 54.0 - 46.6 %   MCV 95 79 - 97 fL  MCH 30.6 26.6 - 33.0 pg   MCHC 32.0 31.5 - 35.7 g/dL   RDW 95.2 84.1 - 32.4 %   Neutrophils 65 Not Estab. %   Lymphs 24 Not Estab. %   Monocytes 9 Not Estab. %   Eos 1 Not Estab. %   Basos 1 Not Estab. %   Neutrophils Absolute 4.5 1.4 - 7.0 x10E3/uL   Lymphocytes Absolute 1.6 0.7 - 3.1 x10E3/uL   Monocytes Absolute 0.6 0.1 - 0.9 x10E3/uL   EOS (ABSOLUTE) 0.0 0.0 - 0.4 x10E3/uL   Basophils Absolute 0.1 0.0 - 0.2 x10E3/uL   Immature Granulocytes 0 Not Estab. %   Immature Grans (Abs) 0.0 0.0 - 0.1 x10E3/uL  CMP14+EGFR     Status: Abnormal   Collection Time: 12/14/22 12:00 PM  Result Value Ref Range   Glucose 81 70 - 99 mg/dL   BUN 10 8 - 27 mg/dL   Creatinine, Ser 4.01 (L) 0.57 - 1.00 mg/dL   eGFR 027 >25 DG/UYQ/0.34   BUN/Creatinine Ratio 26 12 - 28   Sodium 142 134 - 144 mmol/L   Potassium 4.2 3.5 - 5.2 mmol/L   Chloride 102 96 - 106 mmol/L   CO2 23 20 - 29 mmol/L   Calcium 9.3 8.7 - 10.3 mg/dL   Total Protein 6.5 6.0 - 8.5 g/dL   Albumin 4.2 3.9 - 4.9 g/dL   Globulin, Total 2.3 1.5 - 4.5 g/dL   Albumin/Globulin Ratio 1.8 1.2 - 2.2   Bilirubin Total 0.3 0.0 - 1.2 mg/dL   Alkaline Phosphatase 92 44 - 121 IU/L   AST 17 0 - 40 IU/L   ALT 20 0 - 32 IU/L      Assessment & Plan:  Trial of Medrol Dosepak. Patient encouraged to stop taking her statins regularly. Problem List Items Addressed This Visit     MDD (major depressive disorder)   Muscular dystrophy (HCC)   Tobacco use disorder   Gastroesophageal reflux disease   Mixed hyperlipidemia   Lumbar radiculopathy - Primary   Relevant Medications   methylPREDNISolone (MEDROL DOSEPAK) 4 MG TBPK tablet    Return in about 4 weeks (around 03/03/2023).   Total time spent: 30 minutes  Margaretann Loveless, MD  02/03/2023   This document may have been prepared by [facility] Voice Recognition software and as such may include unintentional  dictation errors.

## 2023-03-04 ENCOUNTER — Ambulatory Visit: Payer: PPO | Admitting: Internal Medicine

## 2023-05-02 ENCOUNTER — Ambulatory Visit: Payer: PPO | Admitting: Internal Medicine

## 2023-05-05 ENCOUNTER — Ambulatory Visit (INDEPENDENT_AMBULATORY_CARE_PROVIDER_SITE_OTHER): Payer: PPO | Admitting: Internal Medicine

## 2023-05-05 ENCOUNTER — Encounter: Payer: Self-pay | Admitting: Internal Medicine

## 2023-05-05 VITALS — BP 130/76 | HR 76 | Ht 62.0 in | Wt 138.4 lb

## 2023-05-05 DIAGNOSIS — Z1231 Encounter for screening mammogram for malignant neoplasm of breast: Secondary | ICD-10-CM | POA: Diagnosis not present

## 2023-05-05 DIAGNOSIS — E890 Postprocedural hypothyroidism: Secondary | ICD-10-CM

## 2023-05-05 DIAGNOSIS — E782 Mixed hyperlipidemia: Secondary | ICD-10-CM

## 2023-05-05 DIAGNOSIS — F411 Generalized anxiety disorder: Secondary | ICD-10-CM

## 2023-05-05 MED ORDER — BUSPIRONE HCL 15 MG PO TABS: 15.0000 mg | ORAL_TABLET | Freq: Two times a day (BID) | ORAL | 3 refills | Status: DC

## 2023-05-05 NOTE — Progress Notes (Signed)
Established Patient Office Visit  Subjective:  Patient ID: Tara Scott, female    DOB: 1961/02/09  Age: 62 y.o. MRN: [id number]  Chief Complaint  Patient presents with   Follow-up    2 month follow up    Patient is here for follow-up.  She reports that she has been very anxious recently.  She admits that she has not started her BuSpar but is only taking citalopram.  Wants to reduce try BuSpar and requests a new refill sent to the pharmacy. She has also started taking her rosuvastatin since she got concerned about high cholesterol. Her leg pains a little better, she had taken prednisone 3 months ago.  Her CTA angiogram with runoff showed patent lower extremity blood vessels.  She is also cut back on her smoking. Agrees to get a mammogram, will send in an order.    No other concerns at this time.   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    Malignant hyperthermia    Toxic multinodular goiter     Past Surgical History:  Procedure Laterality Date   ADENOIDECTOMY     CESAREAN SECTION     COLONOSCOPY WITH PROPOFOL N/A 03/08/2016   Procedure: COLONOSCOPY WITH PROPOFOL;  Surgeon: Scot Jun, MD;  Location: [facility] ENDOSCOPY;  Service: Endoscopy;  Laterality: N/A;   LAPAROTOMY  1984   OVARIAN CYST   TOTAL HIP ARTHROPLASTY Right 04/23/2021   Procedure: TOTAL HIP ARTHROPLASTY ANTERIOR APPROACH;  Surgeon: Kennedy Bucker, MD;  Location: [facility] ORS;  Service: Orthopedics;  Laterality: Right;    Social History   Socioeconomic History   Marital status: Widowed    Spouse name: Not on file   Number of children: 1   Years of education: Not on  file   Highest education level: High school graduate  Occupational History   Occupation: disability  Tobacco Use   Smoking status: Every Day    Current packs/day: 0.50    Average packs/day: 0.5 packs/day for 40.0 years (20.0 ttl pk-yrs)    Types: Cigarettes   Smokeless tobacco: Never  Vaping Use   Vaping status: Never Used  Substance and Sexual Activity   Alcohol use: No    Alcohol/week: 0.0 standard drinks of alcohol   Drug use: No   Sexual activity: Not on file  Other Topics Concern   Not on file  Social History Narrative   Not on file   Social Determinants of Health   Financial Resource Strain: Low Risk  (10/15/2020)   Overall Financial Resource Strain (CARDIA)    Difficulty of Paying Living Expenses: Not very hard  Food Insecurity: No Food Insecurity (12/10/2021)   Hunger Vital Sign    Worried About Running Out of Food in the Last Year: Never true    Ran Out of Food in the Last Year: Never true  Transportation Needs: No Transportation Needs (12/10/2021)   PRAPARE - Administrator, Civil Service (Medical): No    Lack of Transportation (Non-Medical): No  Physical Activity: Inactive (10/16/2020)   Exercise Vital Sign    Days of Exercise per Week: 0 days  Minutes of Exercise per Session: 0 min  Stress: Stress Concern Present (12/10/2021)   [employer] of Occupational Health - Occupational Stress Questionnaire    Feeling of Stress : Rather much  Social Connections: Socially Isolated (10/16/2020)   Social Connection and Isolation Panel [NHANES]    Frequency of Communication with Friends and Family: Once a week    Frequency of Social Gatherings with Friends and Family: Never    Attends Religious Services: Never    Database administrator or Organizations: No    Attends Banker Meetings: Never    Marital Status: Widowed  Intimate Partner Violence: Not At Risk (10/16/2020)   Humiliation, Afraid, Rape, and Kick questionnaire    Fear of Current or  Ex-Partner: No    Emotionally Abused: No    Physically Abused: No    Sexually Abused: No    Family History  Problem Relation Age of Onset   CVA Mother    Hypercholesterolemia Mother    Depression Mother    Rheum arthritis Mother    Deep vein thrombosis Mother    Bladder Cancer Father    Diverticulitis Father    Pulmonary embolism Father    Lung cancer Father    Deep vein thrombosis Sister    Colon polyps Sister    Atrial fibrillation Sister    Muscular dystrophy Sister    Cancer Sister        lung spreading all over   Diabetes Son        type 2    Allergies  Allergen Reactions   Anesthetics, Halogenated Other (See Comments)    Other reaction(s): Other (See Comments)   Ciprofloxacin     swelling   Methimazole     hives   Oxycodone-Acetaminophen Nausea And Vomiting   Penicillins Other (See Comments)    RXN AS A CHILD    Review of Systems  Constitutional: Negative.   HENT: Negative.    Eyes: Negative.   Respiratory: Negative.  Negative for cough and shortness of breath.   Cardiovascular: Negative.  Negative for chest pain, palpitations and leg swelling.  Gastrointestinal: Negative.  Negative for abdominal pain, constipation, diarrhea, heartburn, nausea and vomiting.  Genitourinary: Negative.  Negative for dysuria and flank pain.  Musculoskeletal: Negative.  Negative for joint pain and myalgias.  Skin: Negative.   Neurological: Negative.  Negative for dizziness and headaches.  Endo/Heme/Allergies: Negative.   Psychiatric/Behavioral: Negative.  Negative for depression and suicidal ideas. The patient is not nervous/anxious.        Objective:   BP 130/76   Pulse 76   Ht 5\' 2"  (1.575 m)   Wt 138 lb 6.4 oz (62.8 kg)   SpO2 100%   BMI 25.31 kg/m   Vitals:   05/05/23 1450  BP: 130/76  Pulse: 76  Height: 5\' 2"  (1.575 m)  Weight: 138 lb 6.4 oz (62.8 kg)  SpO2: 100%  BMI (Calculated): 25.31    Physical Exam Vitals and nursing note reviewed.   Constitutional:      Appearance: Normal appearance.  HENT:     Head: Normocephalic and atraumatic.     Nose: Nose normal.     Mouth/Throat:     Mouth: Mucous membranes are moist.     Pharynx: Oropharynx is clear.  Eyes:     Conjunctiva/sclera: Conjunctivae normal.     Pupils: Pupils are equal, round, and reactive to light.  Cardiovascular:     Rate and Rhythm: Normal rate and regular rhythm.  Pulses: Normal pulses.     Heart sounds: Normal heart sounds. No murmur heard. Pulmonary:     Effort: Pulmonary effort is normal.     Breath sounds: Normal breath sounds. No wheezing.  Abdominal:     General: Bowel sounds are normal.     Palpations: Abdomen is soft.     Tenderness: There is no abdominal tenderness. There is no right CVA tenderness or left CVA tenderness.  Musculoskeletal:        General: Normal range of motion.     Cervical back: Normal range of motion.     Right lower leg: No edema.     Left lower leg: No edema.  Skin:    General: Skin is warm and dry.  Neurological:     General: No focal deficit present.     Mental Status: She is alert and oriented to person, place, and time.  Psychiatric:        Mood and Affect: Mood normal.        Behavior: Behavior normal.      No results found for any visits on 05/05/23.  No results found for this or any previous visit (from the past 2160 hour(s)).    Assessment & Plan:  Add BuSpar 15 mg twice a day.  Continue citalopram and Crestor. Check labs today. Problem List Items Addressed This Visit     Postablative hypothyroidism   Relevant Orders   TSH+T4F+T3Free   Mixed hyperlipidemia - Primary   Relevant Orders   Lipid Panel w/o Chol/HDL Ratio   CMP14+EGFR   GAD (generalized anxiety disorder)   Relevant Medications   busPIRone (BUSPAR) 15 MG tablet   Other Visit Diagnoses     Breast cancer screening by mammogram       Relevant Orders   MM 3D SCREENING MAMMOGRAM BILATERAL BREAST       Return in about 3  weeks (around 05/26/2023).   Total time spent: 30 minutes  Margaretann Loveless, MD  05/05/2023   This document may have been prepared by [facility] Voice Recognition software and as such may include unintentional dictation errors.

## 2023-05-06 ENCOUNTER — Encounter: Payer: Self-pay | Admitting: Family

## 2023-05-06 LAB — CMP14+EGFR
ALT: 18 IU/L (ref 0–32)
AST: 20 IU/L (ref 0–40)
Albumin: 4.3 g/dL (ref 3.9–4.9)
Alkaline Phosphatase: 75 IU/L (ref 44–121)
BUN/Creatinine Ratio: 33 — ABNORMAL HIGH (ref 12–28)
BUN: 13 mg/dL (ref 8–27)
Bilirubin Total: 0.3 mg/dL (ref 0.0–1.2)
CO2: 26 mmol/L (ref 20–29)
Calcium: 8.9 mg/dL (ref 8.7–10.3)
Chloride: 106 mmol/L (ref 96–106)
Creatinine, Ser: 0.39 mg/dL — ABNORMAL LOW (ref 0.57–1.00)
Globulin, Total: 2.2 g/dL (ref 1.5–4.5)
Glucose: 84 mg/dL (ref 70–99)
Potassium: 4.8 mmol/L (ref 3.5–5.2)
Sodium: 144 mmol/L (ref 134–144)
Total Protein: 6.5 g/dL (ref 6.0–8.5)
eGFR: 113 mL/min/{1.73_m2} (ref >59)

## 2023-05-06 LAB — LIPID PANEL W/O CHOL/HDL RATIO
Cholesterol, Total: 145 mg/dL (ref 100–199)
HDL: 65 mg/dL (ref >39)
LDL Chol Calc (NIH): 69 mg/dL (ref 0–99)
Triglycerides: 48 mg/dL (ref 0–149)
VLDL Cholesterol Cal: 11 mg/dL (ref 5–40)

## 2023-05-06 LAB — TSH+T4F+T3FREE
Free T4: 1.87 ng/dL — ABNORMAL HIGH (ref 0.82–1.77)
T3, Free: 2.8 pg/mL (ref 2.0–4.4)
TSH: 1.04 u[IU]/mL (ref 0.450–4.500)

## 2023-05-06 MED ORDER — LEVOTHYROXINE SODIUM 100 MCG PO TABS: 100.0000 ug | ORAL_TABLET | Freq: Every day | ORAL | 11 refills | Status: DC

## 2023-05-09 ENCOUNTER — Telehealth: Payer: Self-pay

## 2023-05-09 NOTE — Telephone Encounter (Signed)
Spoke with pt who verbalized change in decision.

## 2023-05-09 NOTE — Progress Notes (Signed)
Spoke with pt who verbalized understanding.

## 2023-05-26 ENCOUNTER — Ambulatory Visit: Payer: PPO | Admitting: Internal Medicine

## 2023-05-27 ENCOUNTER — Other Ambulatory Visit: Payer: Self-pay | Admitting: Internal Medicine

## 2023-05-27 DIAGNOSIS — F411 Generalized anxiety disorder: Secondary | ICD-10-CM

## 2023-06-28 ENCOUNTER — Encounter: Payer: Self-pay | Admitting: Internal Medicine

## 2023-06-28 ENCOUNTER — Ambulatory Visit: Payer: PPO | Admitting: Internal Medicine

## 2023-06-28 VITALS — BP 120/80 | HR 65 | Ht 62.0 in | Wt 137.0 lb

## 2023-06-28 DIAGNOSIS — E890 Postprocedural hypothyroidism: Secondary | ICD-10-CM | POA: Diagnosis not present

## 2023-06-28 DIAGNOSIS — Z23 Encounter for immunization: Secondary | ICD-10-CM | POA: Diagnosis not present

## 2023-06-28 DIAGNOSIS — K219 Gastro-esophageal reflux disease without esophagitis: Secondary | ICD-10-CM | POA: Diagnosis not present

## 2023-06-28 DIAGNOSIS — F411 Generalized anxiety disorder: Secondary | ICD-10-CM

## 2023-06-28 DIAGNOSIS — G7100 Muscular dystrophy, unspecified: Secondary | ICD-10-CM | POA: Diagnosis not present

## 2023-06-28 NOTE — Progress Notes (Signed)
Established Patient Office Visit  Subjective:  Patient ID: Tara Scott, female    DOB: [birth year]  Age: 62 y.o. MRN: [id number]  Chief Complaint  Patient presents with   Follow-up    F/U    Patient comes in for her follow-up today.  She admits that she has not started BuSpar and is only taking Celexa 20 mg.  Of anxiety and would have like something to make her fall asleep at night like Xanax. Discussed in detail about avoiding benzodiazepines long-term. Patient encouraged to start taking BuSpar regularly and can also increase her citalopram to 40 mg/day. She still has to make an appointment for her mammogram which she will do. Needs to repeat her thyroid profile today.    No other concerns at this time.   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    Malignant hyperthermia    Toxic multinodular goiter     Past Surgical History:  Procedure Laterality Date   ADENOIDECTOMY     CESAREAN SECTION     COLONOSCOPY WITH PROPOFOL N/A 03/08/2016   Procedure: COLONOSCOPY WITH PROPOFOL;  Surgeon: Scot Jun, MD;  Location: [facility] ENDOSCOPY;  Service: Endoscopy;  Laterality: N/A;   LAPAROTOMY  1984   OVARIAN CYST   TOTAL HIP ARTHROPLASTY Right 04/23/2021   Procedure: TOTAL HIP ARTHROPLASTY ANTERIOR APPROACH;  Surgeon: Kennedy Bucker, MD;  Location: [facility] ORS;  Service: Orthopedics;  Laterality: Right;    Social History   Socioeconomic History   Marital status: Widowed    Spouse name: Not on file   Number of children: 1   Years of education: Not on file   Highest education level: High school graduate  Occupational History   Occupation:  disability  Tobacco Use   Smoking status: Every Day    Current packs/day: 0.50    Average packs/day: 0.5 packs/day for 40.0 years (20.0 ttl pk-yrs)    Types: Cigarettes   Smokeless tobacco: Never  Vaping Use   Vaping status: Never Used  Substance and Sexual Activity   Alcohol use: No    Alcohol/week: 0.0 standard drinks of alcohol   Drug use: No   Sexual activity: Not on file  Other Topics Concern   Not on file  Social History Narrative   Not on file   Social Determinants of Health   Financial Resource Strain: Low Risk  (10/15/2020)   Overall Financial Resource Strain (CARDIA)    Difficulty of Paying Living Expenses: Not very hard  Food Insecurity: No Food Insecurity (12/10/2021)   Hunger Vital Sign    Worried About Running Out of Food in the Last Year: Never true    Ran Out of Food in the Last Year: Never true  Transportation Needs: No Transportation Needs (12/10/2021)   PRAPARE - Administrator, Civil Service (Medical): No    Lack of Transportation (Non-Medical): No  Physical Activity: Inactive (10/16/2020)   Exercise Vital Sign    Days of Exercise per Week: 0 days    Minutes of Exercise per Session: 0 min  Stress: Stress Concern Present (12/10/2021)   [employer] of Occupational Health - Occupational  Stress Questionnaire    Feeling of Stress : Rather much  Social Connections: Socially Isolated (10/16/2020)   Social Connection and Isolation Panel [NHANES]    Frequency of Communication with Friends and Family: Once a week    Frequency of Social Gatherings with Friends and Family: Never    Attends Religious Services: Never    Database administrator or Organizations: No    Attends Banker Meetings: Never    Marital Status: Widowed  Intimate Partner Violence: Not At Risk (10/16/2020)   Humiliation, Afraid, Rape, and Kick questionnaire    Fear of Current or Ex-Partner: No    Emotionally Abused: No    Physically Abused: No    Sexually Abused: No     Family History  Problem Relation Age of Onset   CVA Mother    Hypercholesterolemia Mother    Depression Mother    Rheum arthritis Mother    Deep vein thrombosis Mother    Bladder Cancer Father    Diverticulitis Father    Pulmonary embolism Father    Lung cancer Father    Deep vein thrombosis Sister    Colon polyps Sister    Atrial fibrillation Sister    Muscular dystrophy Sister    Cancer Sister        lung spreading all over   Diabetes Son        type 2    Allergies  Allergen Reactions   Anesthetics, Halogenated Other (See Comments)    Other reaction(s): Other (See Comments)   Ciprofloxacin     swelling   Methimazole     hives   Oxycodone-Acetaminophen Nausea And Vomiting   Penicillins Other (See Comments)    RXN AS A CHILD    Review of Systems  Constitutional: Negative.  Negative for chills, diaphoresis, fever, malaise/fatigue and weight loss.  HENT: Negative.    Eyes: Negative.   Respiratory: Negative.  Negative for cough and shortness of breath.   Cardiovascular: Negative.  Negative for chest pain, palpitations and leg swelling.  Gastrointestinal: Negative.  Negative for abdominal pain, constipation, diarrhea, heartburn, nausea and vomiting.  Genitourinary: Negative.  Negative for dysuria and flank pain.  Musculoskeletal: Negative.  Negative for joint pain and myalgias.  Skin: Negative.   Neurological: Negative.  Negative for dizziness, tingling, tremors and headaches.  Endo/Heme/Allergies: Negative.   Psychiatric/Behavioral:  Negative for depression and suicidal ideas. The patient is nervous/anxious.        Objective:   BP 120/80   Pulse 65   Ht 5\' 2"  (1.575 m)   Wt 137 lb (62.1 kg)   SpO2 98%   BMI 25.06 kg/m   Vitals:   06/28/23 1422  BP: 120/80  Pulse: 65  Height: 5\' 2"  (1.575 m)  Weight: 137 lb (62.1 kg)  SpO2: 98%  BMI (Calculated): 25.05    Physical Exam Vitals and nursing note reviewed.  Constitutional:      Appearance:  Normal appearance.  HENT:     Head: Normocephalic and atraumatic.     Nose: Nose normal.     Mouth/Throat:     Mouth: Mucous membranes are moist.     Pharynx: Oropharynx is clear.  Eyes:     Conjunctiva/sclera: Conjunctivae normal.     Pupils: Pupils are equal, round, and reactive to light.  Cardiovascular:     Rate and Rhythm: Normal rate and regular rhythm.     Pulses: Normal pulses.     Heart sounds: Normal heart sounds. No  murmur heard. Pulmonary:     Effort: Pulmonary effort is normal.     Breath sounds: Normal breath sounds. No wheezing.  Abdominal:     General: Bowel sounds are normal.     Palpations: Abdomen is soft.     Tenderness: There is no abdominal tenderness. There is no right CVA tenderness or left CVA tenderness.  Musculoskeletal:        General: Normal range of motion.     Cervical back: Normal range of motion.     Right lower leg: No edema.     Left lower leg: No edema.  Skin:    General: Skin is warm and dry.  Neurological:     General: No focal deficit present.     Mental Status: She is alert and oriented to person, place, and time.  Psychiatric:        Mood and Affect: Mood normal.        Behavior: Behavior normal.      No results found for any visits on 06/28/23.  Recent Results (from the past 2160 hour(s))  Lipid Panel w/o Chol/HDL Ratio     Status: None   Collection Time: 05/05/23  3:24 PM  Result Value Ref Range   Cholesterol, Total 145 100 - 199 mg/dL   Triglycerides 48 0 - 149 mg/dL   HDL 65 >10 mg/dL   VLDL Cholesterol Cal 11 5 - 40 mg/dL   LDL Chol Calc (NIH) 69 0 - 99 mg/dL  [id number]     Status: Abnormal   Collection Time: 05/05/23  3:24 PM  Result Value Ref Range   Glucose 84 70 - 99 mg/dL   BUN 13 8 - 27 mg/dL   Creatinine, Ser 0.34 (L) 0.57 - 1.00 mg/dL   eGFR 742 >59 DG/LOV/5.64   BUN/Creatinine Ratio 33 (H) 12 - 28   Sodium 144 134 - 144 mmol/L   Potassium 4.8 3.5 - 5.2 mmol/L   Chloride 106 96 - 106 mmol/L   CO2 26 20  - 29 mmol/L   Calcium 8.9 8.7 - 10.3 mg/dL   Total Protein 6.5 6.0 - 8.5 g/dL   Albumin 4.3 3.9 - 4.9 g/dL   Globulin, Total 2.2 1.5 - 4.5 g/dL   Bilirubin Total 0.3 0.0 - 1.2 mg/dL   Alkaline Phosphatase 75 44 - 121 IU/L   AST 20 0 - 40 IU/L   ALT 18 0 - 32 IU/L  TSH+T4F+T3Free     Status: Abnormal   Collection Time: 05/05/23  3:24 PM  Result Value Ref Range   TSH 1.040 0.450 - 4.500 uIU/mL   T3, Free 2.8 2.0 - 4.4 pg/mL   Free T4 1.87 (H) 0.82 - 1.77 ng/dL      Assessment & Plan:  Patient encouraged to start BuSpar twice a day. Increase citalopram to 40 mg. Return in 3 weeks for further management. Problem List Items Addressed This Visit     Muscular dystrophy (HCC)   Postablative hypothyroidism   Relevant Orders   TSH+T4F+T3Free   Gastroesophageal reflux disease   GAD (generalized anxiety disorder) - Primary   Other Visit Diagnoses     Need for immunization against influenza       Relevant Orders   Flu Vaccine Trivalent High Dose (Fluad) (Completed)       Return in about 3 weeks (around 07/19/2023).   Total time spent: 30 minutes  Margaretann Loveless, MD  06/28/2023   This document may have been prepared by Reubin Milan  Voice Recognition software and as such may include unintentional dictation errors.

## 2023-06-29 LAB — TSH+T4F+T3FREE
Free T4: 1.53 ng/dL (ref 0.82–1.77)
T3, Free: 2.4 pg/mL (ref 2.0–4.4)
TSH: 4.02 u[IU]/mL (ref 0.450–4.500)

## 2023-07-01 ENCOUNTER — Telehealth: Payer: Self-pay | Admitting: Internal Medicine

## 2023-07-01 NOTE — Telephone Encounter (Signed)
Patient called in stating where she received her flu vaccine on Tuesday is still red, about the size of a 50 cent piece she states. Is this anything to be concerned about or will it go away on its own?

## 2023-07-24 ENCOUNTER — Other Ambulatory Visit: Payer: Self-pay | Admitting: Internal Medicine

## 2023-07-26 ENCOUNTER — Ambulatory Visit: Payer: PPO | Admitting: Internal Medicine

## 2023-07-30 ENCOUNTER — Other Ambulatory Visit: Payer: Self-pay | Admitting: Internal Medicine

## 2023-08-05 ENCOUNTER — Encounter: Payer: Self-pay | Admitting: Cardiology

## 2023-08-05 ENCOUNTER — Ambulatory Visit (INDEPENDENT_AMBULATORY_CARE_PROVIDER_SITE_OTHER): Payer: PPO | Admitting: Cardiology

## 2023-08-05 VITALS — BP 114/68 | HR 67 | Ht 62.0 in | Wt 137.6 lb

## 2023-08-05 DIAGNOSIS — H6693 Otitis media, unspecified, bilateral: Secondary | ICD-10-CM | POA: Diagnosis not present

## 2023-08-05 DIAGNOSIS — H6690 Otitis media, unspecified, unspecified ear: Secondary | ICD-10-CM | POA: Insufficient documentation

## 2023-08-05 DIAGNOSIS — Z0130 Encounter for examination of blood pressure without abnormal findings: Secondary | ICD-10-CM

## 2023-08-05 MED ORDER — AZITHROMYCIN 250 MG PO TABS
ORAL_TABLET | ORAL | 0 refills | Status: AC
Start: 2023-08-05 — End: 2023-08-10

## 2023-08-05 MED ORDER — FLUTICASONE PROPIONATE 50 MCG/ACT NA SUSP: 1.0000 | Freq: Every day | NASAL | 2 refills | Status: DC

## 2023-08-05 MED ORDER — DEBROX 6.5 % OT SOLN: 5.0000 [drp] | Freq: Two times a day (BID) | OTIC | 2 refills | Status: DC

## 2023-08-05 NOTE — Progress Notes (Signed)
Established Patient Office Visit  Subjective:  Patient ID: Tara Scott, female    DOB: 09-03-1961  Age: 62 y.o. MRN: [id number]  Chief Complaint  Patient presents with   Sinus Problem    Patient in office for an acute visit, complaining of sinus pressure. Patient complaining of headaches, congestion, cough, and ear pain. Patient reports taking acetaminophen with no relief. Recommend Zyrtec, Flonase, Mucinex. Will send in a Z-pack. Push fluids. Will send in Debrox ear drops for cerumen in ears.   Sinus Problem This is a new problem. The current episode started in the past 7 days. The problem is unchanged. There has been no fever. The pain is mild. Associated symptoms include congestion, coughing, ear pain and headaches. Pertinent negatives include no shortness of breath, sinus pressure, sneezing or sore throat. (rhinorrhea) Past treatments include acetaminophen. The treatment provided no relief.    No other concerns at this time.   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    Malignant hyperthermia    Toxic multinodular goiter     Past Surgical History:  Procedure Laterality Date   ADENOIDECTOMY     CESAREAN SECTION     COLONOSCOPY WITH PROPOFOL N/A 03/08/2016   Procedure: COLONOSCOPY WITH PROPOFOL;  Surgeon: Scot Jun, MD;  Location: [facility] ENDOSCOPY;  Service: Endoscopy;  Laterality: N/A;   LAPAROTOMY  1984   OVARIAN CYST   TOTAL HIP ARTHROPLASTY Right 04/23/2021   Procedure: TOTAL HIP ARTHROPLASTY ANTERIOR APPROACH;  Surgeon: Kennedy Bucker, MD;  Location: [facility] ORS;  Service: Orthopedics;  Laterality: Right;    Social History    Socioeconomic History   Marital status: Widowed    Spouse name: Not on file   Number of children: 1   Years of education: Not on file   Highest education level: High school graduate  Occupational History   Occupation: disability  Tobacco Use   Smoking status: Every Day    Current packs/day: 0.50    Average packs/day: 0.5 packs/day for 40.0 years (20.0 ttl pk-yrs)    Types: Cigarettes   Smokeless tobacco: Never  Vaping Use   Vaping status: Never Used  Substance and Sexual Activity   Alcohol use: No    Alcohol/week: 0.0 standard drinks of alcohol   Drug use: No   Sexual activity: Not on file  Other Topics Concern   Not on file  Social History Narrative   Not on file   Social Determinants of Health   Financial Resource Strain: Low Risk  (10/15/2020)   Overall Financial Resource Strain (CARDIA)    Difficulty of Paying Living Expenses: Not very hard  Food Insecurity: No Food Insecurity (12/10/2021)   Hunger Vital Sign    Worried About Running Out of Food in the Last Year: Never true    Ran Out of Food in the Last Year: Never true  Transportation Needs: No Transportation Needs (12/10/2021)   PRAPARE - Administrator, Civil Service (Medical): No    Lack of Transportation (Non-Medical): No  Physical Activity: Inactive (10/16/2020)   Exercise Vital Sign    Days of Exercise per Week: 0 days  Minutes of Exercise per Session: 0 min  Stress: Stress Concern Present (12/10/2021)   [employer] of Occupational Health - Occupational Stress Questionnaire    Feeling of Stress : Rather much  Social Connections: Socially Isolated (10/16/2020)   Social Connection and Isolation Panel [NHANES]    Frequency of Communication with Friends and Family: Once a week    Frequency of Social Gatherings with Friends and Family: Never    Attends Religious Services: Never    Database administrator or Organizations: No    Attends Banker Meetings: Never    Marital Status:  Widowed  Intimate Partner Violence: Not At Risk (10/16/2020)   Humiliation, Afraid, Rape, and Kick questionnaire    Fear of Current or Ex-Partner: No    Emotionally Abused: No    Physically Abused: No    Sexually Abused: No    Family History  Problem Relation Age of Onset   CVA Mother    Hypercholesterolemia Mother    Depression Mother    Rheum arthritis Mother    Deep vein thrombosis Mother    Bladder Cancer Father    Diverticulitis Father    Pulmonary embolism Father    Lung cancer Father    Deep vein thrombosis Sister    Colon polyps Sister    Atrial fibrillation Sister    Muscular dystrophy Sister    Cancer Sister        lung spreading all over   Diabetes Son        type 2    Allergies  Allergen Reactions   Anesthetics, Halogenated Other (See Comments)    Other reaction(s): Other (See Comments)   Ciprofloxacin     swelling   Methimazole     hives   Oxycodone-Acetaminophen Nausea And Vomiting   Penicillins Other (See Comments)    RXN AS A CHILD    Outpatient Medications Prior to Visit  Medication Sig   citalopram (CELEXA) 20 MG tablet TAKE 1 TABLET BY MOUTH EVERY DAY   levothyroxine (SYNTHROID) 100 MCG tablet Take 1 tablet (100 mcg total) by mouth daily.   rosuvastatin (CRESTOR) 5 MG tablet TAKE 1 TABLET (5 MG TOTAL) BY MOUTH DAILY.   busPIRone (BUSPAR) 15 MG tablet TAKE 1 TABLET BY MOUTH 2 TIMES DAILY. (Patient not taking: Reported on 08/05/2023)   No facility-administered medications prior to visit.    Review of Systems  Constitutional: Negative.   HENT:  Positive for congestion and ear pain. Negative for sinus pressure, sinus pain, sneezing and sore throat.   Eyes: Negative.   Respiratory:  Positive for cough. Negative for shortness of breath.   Cardiovascular: Negative.  Negative for chest pain.  Gastrointestinal: Negative.  Negative for abdominal pain, constipation and diarrhea.  Genitourinary: Negative.   Musculoskeletal:  Negative for joint pain  and myalgias.  Skin: Negative.   Neurological:  Positive for headaches. Negative for dizziness.  Endo/Heme/Allergies: Negative.   All other systems reviewed and are negative.      Objective:   BP 114/68   Pulse 67   Ht 5\' 2"  (1.575 m)   Wt 137 lb 9.6 oz (62.4 kg)   SpO2 97%   BMI 25.17 kg/m   Vitals:   08/05/23 1409  BP: 114/68  Pulse: 67  Height: 5\' 2"  (1.575 m)  Weight: 137 lb 9.6 oz (62.4 kg)  SpO2: 97%  BMI (Calculated): 25.16    Physical Exam Vitals and nursing note reviewed.  Constitutional:  Appearance: Normal appearance. She is normal weight.  HENT:     Head: Normocephalic and atraumatic.     Right Ear: Drainage and tenderness present. Tympanic membrane is erythematous.     Left Ear: Drainage and tenderness present. Tympanic membrane is not erythematous.     Nose: Nose normal.     Mouth/Throat:     Mouth: Mucous membranes are moist.  Eyes:     Extraocular Movements: Extraocular movements intact.     Conjunctiva/sclera: Conjunctivae normal.     Pupils: Pupils are equal, round, and reactive to light.  Cardiovascular:     Rate and Rhythm: Normal rate and regular rhythm.     Pulses: Normal pulses.     Heart sounds: Normal heart sounds.  Pulmonary:     Effort: Pulmonary effort is normal.     Breath sounds: Normal breath sounds.  Abdominal:     General: Abdomen is flat. Bowel sounds are normal.     Palpations: Abdomen is soft.  Musculoskeletal:        General: Normal range of motion.     Cervical back: Normal range of motion.  Skin:    General: Skin is warm and dry.  Neurological:     General: No focal deficit present.     Mental Status: She is alert and oriented to person, place, and time.  Psychiatric:        Mood and Affect: Mood normal.        Behavior: Behavior normal.        Thought Content: Thought content normal.        Judgment: Judgment normal.      No results found for any visits on 08/05/23.  Recent Results (from the past 2160  hour(s))  TSH+T4F+T3Free     Status: None   Collection Time: 06/28/23  3:17 PM  Result Value Ref Range   TSH 4.020 0.450 - 4.500 uIU/mL   T3, Free 2.4 2.0 - 4.4 pg/mL   Free T4 1.53 0.82 - 1.77 ng/dL      Assessment & Plan:  Debrox ear drips for cerumen. Zyrtec and Mucinex Z-pack Floanse Push fluids  Problem List Items Addressed This Visit       Nervous and Auditory   Acute otitis media - Primary   Relevant Medications   azithromycin (ZITHROMAX Z-PAK) 250 MG tablet    Return if symptoms worsen or fail to improve.   Total time spent: 25 minutes  [employer], NP  08/05/2023   This document may have been prepared by Dragon Voice Recognition software and as such may include unintentional dictation errors.

## 2023-08-05 NOTE — Patient Instructions (Addendum)
Debrox ear drops for wax Zyrtec  Mucinex DM Z-pack Flonase Push fluids

## 2023-08-08 ENCOUNTER — Telehealth: Payer: Self-pay | Admitting: Internal Medicine

## 2023-08-08 NOTE — Telephone Encounter (Signed)
Patient left VM that she seen [name] last week and was prescribed ear drops that said to only use in the left ear for the wax but she feels like the right ear is worse. Can she use it in both ears? Please advise.

## 2023-08-22 ENCOUNTER — Ambulatory Visit (INDEPENDENT_AMBULATORY_CARE_PROVIDER_SITE_OTHER): Payer: PPO | Admitting: Cardiology

## 2023-08-22 ENCOUNTER — Encounter: Payer: Self-pay | Admitting: Cardiology

## 2023-08-22 VITALS — BP 150/70 | HR 78 | Ht 63.0 in | Wt 138.4 lb

## 2023-08-22 DIAGNOSIS — K219 Gastro-esophageal reflux disease without esophagitis: Secondary | ICD-10-CM

## 2023-08-22 MED ORDER — PANTOPRAZOLE SODIUM 20 MG PO TBEC: 20.0000 mg | DELAYED_RELEASE_TABLET | Freq: Every day | ORAL | 1 refills | Status: DC

## 2023-08-22 NOTE — Progress Notes (Signed)
Established Patient Office Visit  Subjective:  Patient ID: Tara Scott, female    DOB: August 27, 1961  Age: 62 y.o. MRN: [id number]  Chief Complaint  Patient presents with   Gastroesophageal Reflux    Acid reflux    Patient in office for an acute visit, complaining of acid reflux. Reflux started last weekend.  Took Pepto bismol with no relief. Patient states she was prescribed Nexium and Prilosec, she took neither. Recommend starting pantoprazole. Avoid caffeine.   Gastroesophageal Reflux She complains of belching, dysphagia, heartburn and nausea. She reports no abdominal pain or no chest pain. This is a new problem. The current episode started in the past 7 days. The problem occurs constantly. The problem has been gradually worsening. The heartburn is located in the substernum. The heartburn is of mild intensity. The heartburn does not wake her from sleep. The heartburn does not limit her activity. The heartburn doesn't change with position. The symptoms are aggravated by certain foods. Treatments tried: Pepto. The treatment provided no relief.    No other concerns at this time.   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    Malignant hyperthermia    Toxic multinodular goiter     Past Surgical History:  Procedure Laterality Date   ADENOIDECTOMY     CESAREAN SECTION     COLONOSCOPY WITH PROPOFOL N/A 03/08/2016   Procedure: COLONOSCOPY WITH PROPOFOL;  Surgeon: Scot Jun, MD;  Location: [facility] ENDOSCOPY;  Service: Endoscopy;  Laterality: N/A;   LAPAROTOMY  1984   OVARIAN CYST   TOTAL HIP ARTHROPLASTY Right 04/23/2021   Procedure: TOTAL  HIP ARTHROPLASTY ANTERIOR APPROACH;  Surgeon: Kennedy Bucker, MD;  Location: [facility] ORS;  Service: Orthopedics;  Laterality: Right;    Social History   Socioeconomic History   Marital status: Widowed    Spouse name: Not on file   Number of children: 1   Years of education: Not on file   Highest education level: High school graduate  Occupational History   Occupation: disability  Tobacco Use   Smoking status: Every Day    Current packs/day: 0.50    Average packs/day: 0.5 packs/day for 40.0 years (20.0 ttl pk-yrs)    Types: Cigarettes   Smokeless tobacco: Never  Vaping Use   Vaping status: Never Used  Substance and Sexual Activity   Alcohol use: No    Alcohol/week: 0.0 standard drinks of alcohol   Drug use: No   Sexual activity: Not on file  Other Topics Concern   Not on file  Social History Narrative   Not on file   Social Determinants of Health   Financial Resource Strain: Low Risk  (10/15/2020)   Overall Financial Resource Strain (CARDIA)    Difficulty of Paying Living Expenses: Not very hard  Food Insecurity: No Food Insecurity (12/10/2021)   Hunger Vital Sign    Worried About Running Out of Food in the Last Year: Never true    Ran Out of Food in the Last Year: Never true  Transportation Needs: No Transportation Needs (12/10/2021)   PRAPARE - Administrator, Civil Service (Medical): No  Lack of Transportation (Non-Medical): No  Physical Activity: Inactive (10/16/2020)   Exercise Vital Sign    Days of Exercise per Week: 0 days    Minutes of Exercise per Session: 0 min  Stress: Stress Concern Present (12/10/2021)   [employer] of Occupational Health - Occupational Stress Questionnaire    Feeling of Stress : Rather much  Social Connections: Socially Isolated (10/16/2020)   Social Connection and Isolation Panel [NHANES]    Frequency of Communication with Friends and Family: Once a week    Frequency of Social Gatherings with Friends and Family: Never     Attends Religious Services: Never    Database administrator or Organizations: No    Attends Banker Meetings: Never    Marital Status: Widowed  Intimate Partner Violence: Not At Risk (10/16/2020)   Humiliation, Afraid, Rape, and Kick questionnaire    Fear of Current or Ex-Partner: No    Emotionally Abused: No    Physically Abused: No    Sexually Abused: No    Family History  Problem Relation Age of Onset   CVA Mother    Hypercholesterolemia Mother    Depression Mother    Rheum arthritis Mother    Deep vein thrombosis Mother    Bladder Cancer Father    Diverticulitis Father    Pulmonary embolism Father    Lung cancer Father    Deep vein thrombosis Sister    Colon polyps Sister    Atrial fibrillation Sister    Muscular dystrophy Sister    Cancer Sister        lung spreading all over   Diabetes Son        type 2    Allergies  Allergen Reactions   Anesthetics, Halogenated Other (See Comments)    Other reaction(s): Other (See Comments)   Ciprofloxacin     swelling   Methimazole     hives   Oxycodone-Acetaminophen Nausea And Vomiting   Penicillins Other (See Comments)    RXN AS A CHILD    Outpatient Medications Prior to Visit  Medication Sig   carbamide peroxide (DEBROX) 6.5 % OTIC solution Place 5 drops into the left ear 2 (two) times daily.   citalopram (CELEXA) 20 MG tablet TAKE 1 TABLET BY MOUTH EVERY DAY   fluticasone (FLONASE) 50 MCG/ACT nasal spray Place 1 spray into both nostrils daily.   levothyroxine (SYNTHROID) 100 MCG tablet Take 1 tablet (100 mcg total) by mouth daily.   rosuvastatin (CRESTOR) 5 MG tablet TAKE 1 TABLET (5 MG TOTAL) BY MOUTH DAILY.   busPIRone (BUSPAR) 15 MG tablet TAKE 1 TABLET BY MOUTH 2 TIMES DAILY. (Patient not taking: Reported on 08/05/2023)   No facility-administered medications prior to visit.    Review of Systems  Constitutional: Negative.   HENT: Negative.    Eyes: Negative.   Respiratory: Negative.  Negative  for shortness of breath.   Cardiovascular: Negative.  Negative for chest pain.  Gastrointestinal:  Positive for dysphagia, heartburn and nausea. Negative for abdominal pain, constipation and diarrhea.  Genitourinary: Negative.   Musculoskeletal:  Negative for joint pain and myalgias.  Skin: Negative.   Neurological: Negative.  Negative for dizziness and headaches.  Endo/Heme/Allergies: Negative.   All other systems reviewed and are negative.      Objective:   BP (!) 150/70   Pulse 78   Ht 5\' 3"  (1.6 m)   Wt 138 lb 6.4 oz (62.8 kg)   SpO2 97%   BMI 24.52  kg/m   Vitals:   08/22/23 1308  BP: (!) 150/70  Pulse: 78  Height: 5\' 3"  (1.6 m)  Weight: 138 lb 6.4 oz (62.8 kg)  SpO2: 97%  BMI (Calculated): 24.52    Physical Exam Vitals and nursing note reviewed.  Constitutional:      Appearance: Normal appearance. She is normal weight.  HENT:     Head: Normocephalic and atraumatic.     Nose: Nose normal.     Mouth/Throat:     Mouth: Mucous membranes are moist.  Eyes:     Extraocular Movements: Extraocular movements intact.     Conjunctiva/sclera: Conjunctivae normal.     Pupils: Pupils are equal, round, and reactive to light.  Cardiovascular:     Rate and Rhythm: Normal rate and regular rhythm.     Pulses: Normal pulses.     Heart sounds: Normal heart sounds.  Pulmonary:     Effort: Pulmonary effort is normal.     Breath sounds: Normal breath sounds.  Abdominal:     General: Abdomen is flat. Bowel sounds are normal.     Palpations: Abdomen is soft.  Musculoskeletal:        General: Normal range of motion.     Cervical back: Normal range of motion.  Skin:    General: Skin is warm and dry.  Neurological:     General: No focal deficit present.     Mental Status: She is alert and oriented to person, place, and time.  Psychiatric:        Mood and Affect: Mood normal.        Behavior: Behavior normal.        Thought Content: Thought content normal.        Judgment:  Judgment normal.      No results found for any visits on 08/22/23.  Recent Results (from the past 2160 hour(s))  TSH+T4F+T3Free     Status: None   Collection Time: 06/28/23  3:17 PM  Result Value Ref Range   TSH 4.020 0.450 - 4.500 uIU/mL   T3, Free 2.4 2.0 - 4.4 pg/mL   Free T4 1.53 0.82 - 1.77 ng/dL      Assessment & Plan:  Pantoprazole 20 mg daily.  Problem List Items Addressed This Visit       Digestive   Gastroesophageal reflux disease - Primary   Relevant Medications   pantoprazole (PROTONIX) 20 MG tablet    Return in about 4 weeks (around 09/19/2023) for with NK.   Total time spent: 25 minutes  [employer], NP  08/22/2023   This document may have been prepared by Dragon Voice Recognition software and as such may include unintentional dictation errors.

## 2023-08-31 ENCOUNTER — Other Ambulatory Visit: Payer: Self-pay

## 2023-09-01 MED ORDER — LORATADINE 10 MG PO TABS
10.0000 mg | ORAL_TABLET | Freq: Every day | ORAL | 0 refills | Status: DC
Start: 2023-09-01 — End: 2024-04-17

## 2023-09-13 ENCOUNTER — Other Ambulatory Visit: Payer: Self-pay | Admitting: Cardiology

## 2023-09-19 ENCOUNTER — Ambulatory Visit: Payer: PPO | Admitting: Internal Medicine

## 2023-09-23 ENCOUNTER — Ambulatory Visit: Payer: PPO | Admitting: Internal Medicine

## 2023-10-29 ENCOUNTER — Other Ambulatory Visit: Payer: Self-pay | Admitting: Cardiology

## 2024-01-06 ENCOUNTER — Other Ambulatory Visit: Payer: Self-pay | Admitting: Cardiology

## 2024-01-24 ENCOUNTER — Other Ambulatory Visit: Payer: Self-pay | Admitting: Internal Medicine

## 2024-02-06 ENCOUNTER — Other Ambulatory Visit: Payer: Self-pay | Admitting: Cardiology

## 2024-03-22 ENCOUNTER — Ambulatory Visit (INDEPENDENT_AMBULATORY_CARE_PROVIDER_SITE_OTHER): Admitting: Internal Medicine

## 2024-03-22 ENCOUNTER — Encounter: Payer: Self-pay | Admitting: Internal Medicine

## 2024-03-22 VITALS — BP 126/64 | HR 71 | Ht 62.0 in | Wt 140.2 lb

## 2024-03-22 DIAGNOSIS — Z0130 Encounter for examination of blood pressure without abnormal findings: Secondary | ICD-10-CM

## 2024-03-22 DIAGNOSIS — E890 Postprocedural hypothyroidism: Secondary | ICD-10-CM

## 2024-03-22 DIAGNOSIS — G7100 Muscular dystrophy, unspecified: Secondary | ICD-10-CM | POA: Diagnosis not present

## 2024-03-22 DIAGNOSIS — E782 Mixed hyperlipidemia: Secondary | ICD-10-CM | POA: Diagnosis not present

## 2024-03-22 DIAGNOSIS — F411 Generalized anxiety disorder: Secondary | ICD-10-CM

## 2024-03-22 DIAGNOSIS — K219 Gastro-esophageal reflux disease without esophagitis: Secondary | ICD-10-CM

## 2024-03-22 MED ORDER — MIRTAZAPINE 15 MG PO TABS: 15.0000 mg | ORAL_TABLET | Freq: Every day | ORAL | 2 refills | Status: DC

## 2024-03-22 NOTE — Progress Notes (Signed)
 Established Patient Office Visit  Subjective:  Patient ID: Tara Scott, female    DOB: 07-Aug-1961  Age: 63 y.o. MRN: [id number]  Chief Complaint  Patient presents with   Follow-up    Patient comes in for her follow-up today.  She complains of anxiety and difficulty sleeping at night.  Reports of staying up at night and then eventually falls asleep during the day.  She is not taking the citalopram  anymore.  However she has a prescription for BuSpar .  Will switch to Remeron at bedtime while she can still take her BuSpar  during the day if needed.  Denies headache or dizziness, no nausea vomiting and no chest pain or shortness of breath. She is fasting for blood work today.    No other concerns at this time.   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    Malignant hyperthermia    Toxic multinodular goiter     Past Surgical History:  Procedure Laterality Date   ADENOIDECTOMY     CESAREAN SECTION     COLONOSCOPY WITH PROPOFOL  N/A 03/08/2016   Procedure: COLONOSCOPY WITH PROPOFOL ;  Surgeon: Lamar ONEIDA Holmes, MD;  Location: [facility] ENDOSCOPY;  Service: Endoscopy;  Laterality: N/A;   LAPAROTOMY  1984   OVARIAN CYST   TOTAL HIP ARTHROPLASTY Right 04/23/2021   Procedure: TOTAL HIP ARTHROPLASTY ANTERIOR APPROACH;  Surgeon: Kathlynn Sharper, MD;  Location: [facility] ORS;  Service: Orthopedics;  Laterality: Right;    Social History   Socioeconomic History   Marital status: Widowed    Spouse name: Not on file   Number of children: 1   Years of education: Not on file   Highest education level: High school graduate  Occupational History   Occupation:  disability  Tobacco Use   Smoking status: Every Day    Current packs/day: 0.50    Average packs/day: 0.5 packs/day for 40.0 years (20.0 ttl pk-yrs)    Types: Cigarettes   Smokeless tobacco: Never  Vaping Use   Vaping status: Never Used  Substance and Sexual Activity   Alcohol use: No    Alcohol/week: 0.0 standard drinks of alcohol   Drug use: No   Sexual activity: Not on file  Other Topics Concern   Not on file  Social History Narrative   Not on file   Social Drivers of Health   Financial Resource Strain: Low Risk  (10/15/2020)   Overall Financial Resource Strain (CARDIA)    Difficulty of Paying Living Expenses: Not very hard  Food Insecurity: No Food Insecurity (12/10/2021)   Hunger Vital Sign    Worried About Running Out of Food in the Last Year: Never true    Ran Out of Food in the Last Year: Never true  Transportation Needs: No Transportation Needs (12/10/2021)   PRAPARE - Administrator, Civil Service (Medical): No    Lack of Transportation (Non-Medical): No  Physical Activity: Inactive (10/16/2020)   Exercise Vital Sign    Days of Exercise per Week: 0 days    Minutes of Exercise per Session: 0 min  Stress: Stress Concern Present (12/10/2021)   [employer] of Occupational Health -  Occupational Stress Questionnaire    Feeling of Stress : Rather much  Social Connections: Socially Isolated (10/16/2020)   Social Connection and Isolation Panel    Frequency of Communication with Friends and Family: Once a week    Frequency of Social Gatherings with Friends and Family: Never    Attends Religious Services: Never    Database administrator or Organizations: No    Attends Banker Meetings: Never    Marital Status: Widowed  Intimate Partner Violence: Not At Risk (10/16/2020)   Humiliation, Afraid, Rape, and Kick questionnaire    Fear of Current or Ex-Partner: No    Emotionally Abused: No    Physically Abused: No    Sexually Abused: No    Family  History  Problem Relation Age of Onset   CVA Mother    Hypercholesterolemia Mother    Depression Mother    Rheum arthritis Mother    Deep vein thrombosis Mother    Bladder Cancer Father    Diverticulitis Father    Pulmonary embolism Father    Lung cancer Father    Deep vein thrombosis Sister    Colon polyps Sister    Atrial fibrillation Sister    Muscular dystrophy Sister    Cancer Sister        lung spreading all over   Diabetes Son        type 2    Allergies  Allergen Reactions   Anesthetics, Halogenated Other (See Comments)    Other reaction(s): Other (See Comments)   Ciprofloxacin     swelling   Methimazole     hives   Oxycodone -Acetaminophen  Nausea And Vomiting   Penicillins Other (See Comments)    RXN AS A CHILD    Outpatient Medications Prior to Visit  Medication Sig   carbamide peroxide (DEBROX) 6.5 % OTIC solution Place 5 drops into the left ear 2 (two) times daily.   fluticasone  (FLONASE ) 50 MCG/ACT nasal spray SPRAY 1 SPRAY INTO BOTH NOSTRILS DAILY.   levothyroxine  (SYNTHROID ) 100 MCG tablet Take 1 tablet (100 mcg total) by mouth daily.   loratadine  (CLARITIN ) 10 MG tablet Take 1 tablet (10 mg total) by mouth daily.   pantoprazole  (PROTONIX ) 20 MG tablet TAKE 1 TABLET BY MOUTH EVERY DAY   rosuvastatin  (CRESTOR ) 5 MG tablet TAKE 1 TABLET (5 MG TOTAL) BY MOUTH DAILY.   [DISCONTINUED] citalopram  (CELEXA ) 20 MG tablet TAKE 1 TABLET BY MOUTH EVERY DAY   busPIRone  (BUSPAR ) 15 MG tablet TAKE 1 TABLET BY MOUTH 2 TIMES DAILY. (Patient not taking: Reported on 03/22/2024)   No facility-administered medications prior to visit.    Review of Systems  Constitutional:  Positive for malaise/fatigue. Negative for chills, diaphoresis, fever and weight loss.  HENT: Negative.  Negative for sinus pain and sore throat.   Eyes: Negative.   Respiratory: Negative.  Negative for cough and shortness of breath.   Cardiovascular: Negative.  Negative for chest pain, palpitations and  leg swelling.  Gastrointestinal: Negative.  Negative for abdominal pain, constipation, diarrhea, heartburn, nausea and vomiting.  Genitourinary: Negative.  Negative for dysuria and flank pain.  Musculoskeletal: Negative.  Negative for joint pain and myalgias.  Skin: Negative.   Neurological: Negative.  Negative for dizziness, tingling, tremors and headaches.  Endo/Heme/Allergies: Negative.   Psychiatric/Behavioral: Negative.  Negative for depression and suicidal ideas. The patient is not nervous/anxious.        Objective:   BP 126/64   Pulse 71   Ht 5' 2 (  1.575 m)   Wt 140 lb 3.2 oz (63.6 kg)   SpO2 99%   BMI 25.64 kg/m   Vitals:   03/22/24 1139  BP: 126/64  Pulse: 71  Height: 5' 2 (1.575 m)  Weight: 140 lb 3.2 oz (63.6 kg)  SpO2: 99%  BMI (Calculated): 25.64    Physical Exam Vitals and nursing note reviewed.  Constitutional:      Appearance: Normal appearance.  HENT:     Head: Normocephalic and atraumatic.     Nose: Nose normal.     Mouth/Throat:     Mouth: Mucous membranes are moist.     Pharynx: Oropharynx is clear.  Eyes:     Conjunctiva/sclera: Conjunctivae normal.     Pupils: Pupils are equal, round, and reactive to light.  Cardiovascular:     Rate and Rhythm: Normal rate and regular rhythm.     Pulses: Normal pulses.     Heart sounds: Normal heart sounds. No murmur heard. Pulmonary:     Effort: Pulmonary effort is normal.     Breath sounds: Normal breath sounds. No wheezing.  Abdominal:     General: Bowel sounds are normal.     Palpations: Abdomen is soft.     Tenderness: There is no abdominal tenderness. There is no right CVA tenderness or left CVA tenderness.  Musculoskeletal:        General: Normal range of motion.     Cervical back: Normal range of motion.     Right lower leg: No edema.     Left lower leg: No edema.  Skin:    General: Skin is warm and dry.  Neurological:     General: No focal deficit present.     Mental Status: She is  alert and oriented to person, place, and time.  Psychiatric:        Mood and Affect: Mood normal.        Behavior: Behavior normal.      No results found for any visits on 03/22/24.  No results found for this or any previous visit (from the past 2160 hours).    Assessment & Plan:  Start Remeron 15 mg at bedtime.  Can take her BuSpar  in the morning.  Check blood work today. Problem List Items Addressed This Visit     Muscular dystrophy (HCC)   Postablative hypothyroidism   Relevant Orders   TSH+T4F+T3Free   Gastroesophageal reflux disease   Relevant Orders   CBC with Diff   Mixed hyperlipidemia   Relevant Orders   Lipid Panel w/o Chol/HDL Ratio   CMP14+EGFR   GAD (generalized anxiety disorder) - Primary   Relevant Medications   mirtazapine (REMERON) 15 MG tablet    Return in about 3 weeks (around 04/12/2024).   Total time spent: 30 minutes  FERNAND FREDY RAMAN, MD  03/22/2024   This document may have been prepared by [facility] Voice Recognition software and as such may include unintentional dictation errors.

## 2024-03-23 LAB — CBC WITH DIFFERENTIAL/PLATELET
Basophils Absolute: 0 x10E3/uL (ref 0.0–0.2)
Basos: 1 %
EOS (ABSOLUTE): 0.1 x10E3/uL (ref 0.0–0.4)
Eos: 2 %
Hematocrit: 41.8 % (ref 34.0–46.6)
Hemoglobin: 13.4 g/dL (ref 11.1–15.9)
Immature Grans (Abs): 0 x10E3/uL (ref 0.0–0.1)
Immature Granulocytes: 0 %
Lymphocytes Absolute: 1.7 x10E3/uL (ref 0.7–3.1)
Lymphs: 28 %
MCH: 32.1 pg (ref 26.6–33.0)
MCHC: 32.1 g/dL (ref 31.5–35.7)
MCV: 100 fL — ABNORMAL HIGH (ref 79–97)
Monocytes Absolute: 0.6 x10E3/uL (ref 0.1–0.9)
Monocytes: 9 %
Neutrophils Absolute: 3.6 x10E3/uL (ref 1.4–7.0)
Neutrophils: 60 %
Platelets: 245 x10E3/uL (ref 150–450)
RBC: 4.17 x10E6/uL (ref 3.77–5.28)
RDW: 11.8 % (ref 11.7–15.4)
WBC: 6 x10E3/uL (ref 3.4–10.8)

## 2024-03-23 LAB — LIPID PANEL W/O CHOL/HDL RATIO
Cholesterol, Total: 164 mg/dL (ref 100–199)
HDL: 60 mg/dL (ref >39)
LDL Chol Calc (NIH): 93 mg/dL (ref 0–99)
Triglycerides: 53 mg/dL (ref 0–149)
VLDL Cholesterol Cal: 11 mg/dL (ref 5–40)

## 2024-03-23 LAB — TSH+T4F+T3FREE
Free T4: 1.77 ng/dL (ref 0.82–1.77)
T3, Free: 2.7 pg/mL (ref 2.0–4.4)
TSH: 1.59 u[IU]/mL (ref 0.450–4.500)

## 2024-03-23 LAB — CMP14+EGFR
ALT: 15 IU/L (ref 0–32)
AST: 20 IU/L (ref 0–40)
Albumin: 4.3 g/dL (ref 3.9–4.9)
Alkaline Phosphatase: 81 IU/L (ref 44–121)
BUN/Creatinine Ratio: 35 — ABNORMAL HIGH (ref 12–28)
BUN: 14 mg/dL (ref 8–27)
Bilirubin Total: 0.3 mg/dL (ref 0.0–1.2)
CO2: 22 mmol/L (ref 20–29)
Calcium: 9.5 mg/dL (ref 8.7–10.3)
Chloride: 103 mmol/L (ref 96–106)
Creatinine, Ser: 0.4 mg/dL — ABNORMAL LOW (ref 0.57–1.00)
Globulin, Total: 2.5 g/dL (ref 1.5–4.5)
Glucose: 82 mg/dL (ref 70–99)
Potassium: 4.4 mmol/L (ref 3.5–5.2)
Sodium: 142 mmol/L (ref 134–144)
Total Protein: 6.8 g/dL (ref 6.0–8.5)
eGFR: 111 mL/min/1.73 (ref >59)

## 2024-03-26 ENCOUNTER — Ambulatory Visit: Payer: Self-pay | Admitting: Internal Medicine

## 2024-04-02 ENCOUNTER — Telehealth: Payer: Self-pay

## 2024-04-02 NOTE — Telephone Encounter (Signed)
 Pt called because when she went to pick up her mirtazapine  prescribed to her last week, the pharmacist told her not to take it until she got her eyes checked as she has narrow angles and would need to get her eye pressure checked. Pt also stated she has stopped taking her Buspirone  and wanted you to be informed. Please advise.

## 2024-04-10 DIAGNOSIS — G63 Polyneuropathy in diseases classified elsewhere: Secondary | ICD-10-CM | POA: Diagnosis not present

## 2024-04-10 DIAGNOSIS — E559 Vitamin D deficiency, unspecified: Secondary | ICD-10-CM | POA: Diagnosis not present

## 2024-04-10 DIAGNOSIS — G4700 Insomnia, unspecified: Secondary | ICD-10-CM | POA: Diagnosis not present

## 2024-04-10 DIAGNOSIS — E663 Overweight: Secondary | ICD-10-CM | POA: Diagnosis not present

## 2024-04-10 DIAGNOSIS — F17210 Nicotine dependence, cigarettes, uncomplicated: Secondary | ICD-10-CM | POA: Diagnosis not present

## 2024-04-10 DIAGNOSIS — E039 Hypothyroidism, unspecified: Secondary | ICD-10-CM | POA: Diagnosis not present

## 2024-04-10 DIAGNOSIS — K219 Gastro-esophageal reflux disease without esophagitis: Secondary | ICD-10-CM | POA: Diagnosis not present

## 2024-04-10 DIAGNOSIS — K5900 Constipation, unspecified: Secondary | ICD-10-CM | POA: Diagnosis not present

## 2024-04-10 DIAGNOSIS — F419 Anxiety disorder, unspecified: Secondary | ICD-10-CM | POA: Diagnosis not present

## 2024-04-10 DIAGNOSIS — E785 Hyperlipidemia, unspecified: Secondary | ICD-10-CM | POA: Diagnosis not present

## 2024-04-10 DIAGNOSIS — G7100 Muscular dystrophy, unspecified: Secondary | ICD-10-CM | POA: Diagnosis not present

## 2024-04-10 DIAGNOSIS — F331 Major depressive disorder, recurrent, moderate: Secondary | ICD-10-CM | POA: Diagnosis not present

## 2024-04-12 ENCOUNTER — Ambulatory Visit: Admitting: Internal Medicine

## 2024-04-12 ENCOUNTER — Encounter: Payer: Self-pay | Admitting: Internal Medicine

## 2024-04-12 VITALS — BP 134/76 | HR 70 | Ht 62.0 in | Wt 141.2 lb

## 2024-04-12 DIAGNOSIS — F411 Generalized anxiety disorder: Secondary | ICD-10-CM | POA: Diagnosis not present

## 2024-04-12 DIAGNOSIS — Z122 Encounter for screening for malignant neoplasm of respiratory organs: Secondary | ICD-10-CM | POA: Diagnosis not present

## 2024-04-12 DIAGNOSIS — K219 Gastro-esophageal reflux disease without esophagitis: Secondary | ICD-10-CM | POA: Diagnosis not present

## 2024-04-12 DIAGNOSIS — M5416 Radiculopathy, lumbar region: Secondary | ICD-10-CM

## 2024-04-12 DIAGNOSIS — G7100 Muscular dystrophy, unspecified: Secondary | ICD-10-CM | POA: Diagnosis not present

## 2024-04-12 DIAGNOSIS — E890 Postprocedural hypothyroidism: Secondary | ICD-10-CM | POA: Diagnosis not present

## 2024-04-12 DIAGNOSIS — E782 Mixed hyperlipidemia: Secondary | ICD-10-CM | POA: Diagnosis not present

## 2024-04-12 DIAGNOSIS — Z0130 Encounter for examination of blood pressure without abnormal findings: Secondary | ICD-10-CM

## 2024-04-12 NOTE — Progress Notes (Signed)
 Established Patient Office Visit  Subjective:  Patient ID: Tara Scott, female    DOB: [birth year]  Age: 63 y.o. MRN: [id number]  Chief Complaint  Patient presents with   Follow-up    3 week follow up    Patient comes in for her follow-up today.  She has still not started her Remeron  or taking BuSpar  because she is waiting to get an eye exam.  She is worried about her intraocular pressures.  However she continues to have anxiety.  She got a visit from her insurance housecalls.  Which have recommended a psychiatrist, but patient had declined before.  She also does not like to get mammograms and has declined them.  However she is agreeable to get her low-dose CT chest, as she continues to smoke.  Will send in another order as she missed her last appointment. Patient complains of tingling sensation in her hands and feet, they turn red and feels a rush of blood.  She was evaluated for peripheral vascular disease, which was negative.  She also had negative test for rheumatoid factor, ANA, CRP and sed rate.  Vitamin B12 was normal, slightly low vitamin D . She is reluctant to start gabapentin  for neuropathy. But will start OTC B complex and vitamin D  tablets    No other concerns at this time.   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    Malignant hyperthermia    Toxic multinodular goiter     Past Surgical History:  Procedure Laterality Date   ADENOIDECTOMY     CESAREAN SECTION     COLONOSCOPY WITH PROPOFOL  N/A 03/08/2016   Procedure: COLONOSCOPY WITH PROPOFOL ;  Surgeon: Lamar ONEIDA Holmes, MD;  Location: [facility] ENDOSCOPY;  Service: Endoscopy;   Laterality: N/A;   LAPAROTOMY  1984   OVARIAN CYST   TOTAL HIP ARTHROPLASTY Right 04/23/2021   Procedure: TOTAL HIP ARTHROPLASTY ANTERIOR APPROACH;  Surgeon: Kathlynn Sharper, MD;  Location: [facility] ORS;  Service: Orthopedics;  Laterality: Right;    Social History   Socioeconomic History   Marital status: Widowed    Spouse name: Not on file   Number of children: 1   Years of education: Not on file   Highest education level: High school graduate  Occupational History   Occupation: disability  Tobacco Use   Smoking status: Every Day    Current packs/day: 0.50    Average packs/day: 0.5 packs/day for 40.0 years (20.0 ttl pk-yrs)    Types: Cigarettes   Smokeless tobacco: Never  Vaping Use   Vaping status: Never Used  Substance and Sexual Activity   Alcohol use: No    Alcohol/week: 0.0 standard drinks of alcohol   Drug use: No   Sexual activity: Not on file  Other Topics Concern   Not on file  Social History Narrative   Not on file   Social Drivers of Health   Financial Resource Strain: Low Risk  (10/15/2020)   Overall Financial Resource Strain (CARDIA)    Difficulty of Paying Living Expenses: Not very hard  Food Insecurity: No Food Insecurity (12/10/2021)   Hunger Vital Sign    Worried About Running Out of  Food in the Last Year: Never true    Ran Out of Food in the Last Year: Never true  Transportation Needs: No Transportation Needs (12/10/2021)   PRAPARE - Administrator, Civil Service (Medical): No    Lack of Transportation (Non-Medical): No  Physical Activity: Inactive (10/16/2020)   Exercise Vital Sign    Days of Exercise per Week: 0 days    Minutes of Exercise per Session: 0 min  Stress: Stress Concern Present (12/10/2021)   [employer] of Occupational Health - Occupational Stress Questionnaire    Feeling of Stress : Rather much  Social Connections: Socially Isolated (10/16/2020)   Social Connection and Isolation Panel    Frequency of Communication with  Friends and Family: Once a week    Frequency of Social Gatherings with Friends and Family: Never    Attends Religious Services: Never    Database administrator or Organizations: No    Attends Banker Meetings: Never    Marital Status: Widowed  Intimate Partner Violence: Not At Risk (10/16/2020)   Humiliation, Afraid, Rape, and Kick questionnaire    Fear of Current or Ex-Partner: No    Emotionally Abused: No    Physically Abused: No    Sexually Abused: No    Family History  Problem Relation Age of Onset   CVA Mother    Hypercholesterolemia Mother    Depression Mother    Rheum arthritis Mother    Deep vein thrombosis Mother    Bladder Cancer Father    Diverticulitis Father    Pulmonary embolism Father    Lung cancer Father    Deep vein thrombosis Sister    Colon polyps Sister    Atrial fibrillation Sister    Muscular dystrophy Sister    Cancer Sister        lung spreading all over   Diabetes Son        type 2    Allergies  Allergen Reactions   Anesthetics, Halogenated Other (See Comments)    Other reaction(s): Other (See Comments)   Ciprofloxacin     swelling   Methimazole     hives   Oxycodone -Acetaminophen  Nausea And Vomiting   Penicillins Other (See Comments)    RXN AS A CHILD    Outpatient Medications Prior to Visit  Medication Sig   levothyroxine  (SYNTHROID ) 100 MCG tablet Take 1 tablet (100 mcg total) by mouth daily.   pantoprazole  (PROTONIX ) 20 MG tablet TAKE 1 TABLET BY MOUTH EVERY DAY (Patient taking differently: Take 20 mg by mouth as needed.)   rosuvastatin  (CRESTOR ) 5 MG tablet TAKE 1 TABLET (5 MG TOTAL) BY MOUTH DAILY.   busPIRone  (BUSPAR ) 15 MG tablet TAKE 1 TABLET BY MOUTH 2 TIMES DAILY. (Patient not taking: Reported on 04/12/2024)   carbamide peroxide (DEBROX) 6.5 % OTIC solution Place 5 drops into the left ear 2 (two) times daily. (Patient not taking: Reported on 04/12/2024)   fluticasone  (FLONASE ) 50 MCG/ACT nasal spray SPRAY 1 SPRAY  INTO BOTH NOSTRILS DAILY. (Patient not taking: Reported on 04/12/2024)   loratadine  (CLARITIN ) 10 MG tablet Take 1 tablet (10 mg total) by mouth daily. (Patient not taking: Reported on 04/12/2024)   mirtazapine  (REMERON ) 15 MG tablet Take 1 tablet (15 mg total) by mouth at bedtime. (Patient not taking: Reported on 04/12/2024)   No facility-administered medications prior to visit.    Review of Systems  Constitutional: Negative.  Negative for chills, fever, malaise/fatigue and weight loss.  Eyes: Negative.  Respiratory: Negative.  Negative for cough and shortness of breath.   Cardiovascular: Negative.  Negative for chest pain, palpitations and leg swelling.  Gastrointestinal: Negative.  Negative for abdominal pain, constipation, diarrhea, heartburn, nausea and vomiting.  Genitourinary: Negative.  Negative for dysuria and flank pain.  Musculoskeletal: Negative.  Negative for joint pain and myalgias.  Neurological:  Positive for sensory change. Negative for dizziness, tingling, tremors and headaches.  Endo/Heme/Allergies: Negative.   Psychiatric/Behavioral:  Negative for depression and suicidal ideas. The patient is nervous/anxious.        Objective:   BP 134/76   Pulse 70   Ht 5' 2 (1.575 m)   Wt 141 lb 3.2 oz (64 kg)   SpO2 99%   BMI 25.83 kg/m   Vitals:   04/12/24 1132  BP: 134/76  Pulse: 70  Height: 5' 2 (1.575 m)  Weight: 141 lb 3.2 oz (64 kg)  SpO2: 99%  BMI (Calculated): 25.82    Physical Exam Vitals and nursing note reviewed.  Constitutional:      Appearance: Normal appearance.  HENT:     Head: Normocephalic and atraumatic.     Nose: Nose normal.     Mouth/Throat:     Mouth: Mucous membranes are moist.     Pharynx: Oropharynx is clear.  Eyes:     Conjunctiva/sclera: Conjunctivae normal.     Pupils: Pupils are equal, round, and reactive to light.  Cardiovascular:     Rate and Rhythm: Normal rate and regular rhythm.     Pulses: Normal pulses.     Heart  sounds: Normal heart sounds. No murmur heard. Pulmonary:     Effort: Pulmonary effort is normal.     Breath sounds: Normal breath sounds. No wheezing.  Abdominal:     General: Bowel sounds are normal.     Palpations: Abdomen is soft.     Tenderness: There is no abdominal tenderness. There is no right CVA tenderness or left CVA tenderness.  Musculoskeletal:        General: Normal range of motion.     Cervical back: Normal range of motion.     Right lower leg: No edema.     Left lower leg: No edema.  Skin:    General: Skin is warm and dry.  Neurological:     General: No focal deficit present.     Mental Status: She is alert and oriented to person, place, and time.  Psychiatric:        Mood and Affect: Mood normal.        Behavior: Behavior normal.      No results found for any visits on 04/12/24.  Recent Results (from the past 2160 hours)  TSH+T4F+T3Free     Status: None   Collection Time: 03/22/24 12:13 PM  Result Value Ref Range   TSH 1.590 0.450 - 4.500 uIU/mL   T3, Free 2.7 2.0 - 4.4 pg/mL   Free T4 1.77 0.82 - 1.77 ng/dL  Lipid Panel w/o Chol/HDL Ratio     Status: None   Collection Time: 03/22/24 12:13 PM  Result Value Ref Range   Cholesterol, Total 164 100 - 199 mg/dL   Triglycerides 53 0 - 149 mg/dL   HDL 60 >60 mg/dL   VLDL Cholesterol Cal 11 5 - 40 mg/dL   LDL Chol Calc (NIH) 93 0 - 99 mg/dL  RFE85+ZHQM     Status: Abnormal   Collection Time: 03/22/24 12:13 PM  Result Value Ref Range   Glucose  82 70 - 99 mg/dL   BUN 14 8 - 27 mg/dL   Creatinine, Ser 9.59 (L) 0.57 - 1.00 mg/dL   eGFR 888 >40 fO/fpw/8.26   BUN/Creatinine Ratio 35 (H) 12 - 28   Sodium 142 134 - 144 mmol/L   Potassium 4.4 3.5 - 5.2 mmol/L   Chloride 103 96 - 106 mmol/L   CO2 22 20 - 29 mmol/L   Calcium  9.5 8.7 - 10.3 mg/dL   Total Protein 6.8 6.0 - 8.5 g/dL   Albumin 4.3 3.9 - 4.9 g/dL   Globulin, Total 2.5 1.5 - 4.5 g/dL   Bilirubin Total 0.3 0.0 - 1.2 mg/dL   Alkaline Phosphatase 81 44  - 121 IU/L   AST 20 0 - 40 IU/L   ALT 15 0 - 32 IU/L  CBC with Diff     Status: Abnormal   Collection Time: 03/22/24 12:13 PM  Result Value Ref Range   WBC 6.0 3.4 - 10.8 x10E3/uL   RBC 4.17 3.77 - 5.28 x10E6/uL   Hemoglobin 13.4 11.1 - 15.9 g/dL   Hematocrit 58.1 65.9 - 46.6 %   MCV 100 (H) 79 - 97 fL   MCH 32.1 26.6 - 33.0 pg   MCHC 32.1 31.5 - 35.7 g/dL   RDW 88.1 88.2 - 84.5 %   Platelets 245 150 - 450 x10E3/uL   Neutrophils 60 Not Estab. %   Lymphs 28 Not Estab. %   Monocytes 9 Not Estab. %   Eos 2 Not Estab. %   Basos 1 Not Estab. %   Neutrophils Absolute 3.6 1.4 - 7.0 x10E3/uL   Lymphocytes Absolute 1.7 0.7 - 3.1 x10E3/uL   Monocytes Absolute 0.6 0.1 - 0.9 x10E3/uL   EOS (ABSOLUTE) 0.1 0.0 - 0.4 x10E3/uL   Basophils Absolute 0.0 0.0 - 0.2 x10E3/uL   Immature Granulocytes 0 Not Estab. %   Immature Grans (Abs) 0.0 0.0 - 0.1 x10E3/uL      Assessment & Plan:  Continue current medications.  Add vitamin B complex and vitamin D .  Please schedule low-dose CT lungs for screening.  Patient will think about starting her Remeron  and BuSpar . Problem List Items Addressed This Visit     Muscular dystrophy (HCC)   Postablative hypothyroidism   Gastroesophageal reflux disease   Mixed hyperlipidemia - Primary   Lumbar radiculopathy   GAD (generalized anxiety disorder)   Other Visit Diagnoses       Screening for lung cancer       Relevant Orders   CT CHEST LUNG CANCER SCREENING LOW DOSE WO CONTRAST       Return in about 1 month (around 05/13/2024).   Total time spent: 30 minutes  FERNAND FREDY RAMAN, MD  04/12/2024   This document may have been prepared by [facility] Voice Recognition software and as such may include unintentional dictation errors.

## 2024-04-13 ENCOUNTER — Other Ambulatory Visit: Payer: Self-pay | Admitting: Internal Medicine

## 2024-04-13 DIAGNOSIS — F411 Generalized anxiety disorder: Secondary | ICD-10-CM

## 2024-04-17 ENCOUNTER — Other Ambulatory Visit: Payer: Self-pay | Admitting: Family

## 2024-05-02 DIAGNOSIS — H43813 Vitreous degeneration, bilateral: Secondary | ICD-10-CM | POA: Diagnosis not present

## 2024-05-03 ENCOUNTER — Other Ambulatory Visit: Payer: Self-pay | Admitting: Family

## 2024-05-14 ENCOUNTER — Ambulatory Visit: Admitting: Internal Medicine

## 2024-06-22 ENCOUNTER — Encounter: Payer: Self-pay | Admitting: Cardiology

## 2024-06-22 ENCOUNTER — Ambulatory Visit (INDEPENDENT_AMBULATORY_CARE_PROVIDER_SITE_OTHER): Admitting: Cardiology

## 2024-06-22 ENCOUNTER — Ambulatory Visit
Admission: RE | Admit: 2024-06-22 | Discharge: 2024-06-22 | Disposition: A | Discharge status: Home / Self Care | Source: Ambulatory Visit | Attending: Cardiology | Admitting: Cardiology

## 2024-06-22 ENCOUNTER — Ambulatory Visit: Payer: Self-pay | Admitting: Cardiology

## 2024-06-22 ENCOUNTER — Ambulatory Visit
Admission: RE | Admit: 2024-06-22 | Discharge: 2024-06-22 | Disposition: A | Discharge status: Home / Self Care | Attending: Cardiology | Admitting: Cardiology

## 2024-06-22 VITALS — BP 126/64 | HR 84 | Ht 62.0 in | Wt 143.8 lb

## 2024-06-22 DIAGNOSIS — J069 Acute upper respiratory infection, unspecified: Secondary | ICD-10-CM | POA: Insufficient documentation

## 2024-06-22 DIAGNOSIS — R051 Acute cough: Secondary | ICD-10-CM

## 2024-06-22 DIAGNOSIS — Z0130 Encounter for examination of blood pressure without abnormal findings: Secondary | ICD-10-CM

## 2024-06-22 MED ORDER — METHYLPREDNISOLONE 4 MG PO TBPK: ORAL_TABLET | ORAL | 0 refills | Status: DC

## 2024-06-22 MED ORDER — BENZONATATE 100 MG PO CAPS: 100.0000 mg | ORAL_CAPSULE | Freq: Three times a day (TID) | ORAL | 1 refills | Status: DC | PRN

## 2024-06-22 MED ORDER — AZITHROMYCIN 250 MG PO TABS
ORAL_TABLET | ORAL | 0 refills | Status: AC
Start: 2024-06-22 — End: 2024-06-27

## 2024-06-22 NOTE — Progress Notes (Signed)
 Patient notified

## 2024-06-22 NOTE — Progress Notes (Signed)
 Established Patient Office Visit  Subjective:  Patient ID: Tara Scott, female    DOB: Oct 03, 1960  Age: 63 y.o. MRN: [id number]  Chief Complaint  Patient presents with   Acute Visit    Congested advised to wear a mask. Coughing, stuffy/fluid in ears, drainage down her throat, started 1 week ago, body aches yesterday. Also has acid reflux that has been bothering along with a hernia.     Patient in office for an acute visit, complaining of feeling congested. Patient reports cough started about one week ago. Denies fever. Complains of PND. Wheezing on exam. Will order a chest xray. Recommend continuing Mucinex and Flonase . Will send in a Z pack, medrol  dose pack and tessalon pearls. Drink plenty of water.   Sinus Problem This is a new problem. The current episode started in the past 7 days. The problem is unchanged. There has been no fever. Associated symptoms include congestion and coughing. Pertinent negatives include no headaches, shortness of breath, sinus pressure or sore throat. Treatments tried: Mucinex, Claritin , Flonase . The treatment provided no relief.    No other concerns at this time.   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    Malignant hyperthermia    Toxic multinodular goiter     Past Surgical History:  Procedure Laterality Date   ADENOIDECTOMY     CESAREAN SECTION     COLONOSCOPY WITH PROPOFOL  N/A 03/08/2016   Procedure: COLONOSCOPY WITH PROPOFOL ;  Surgeon: Lamar ONEIDA Holmes, MD;  Location: [facility] ENDOSCOPY;  Service: Endoscopy;  Laterality: N/A;   LAPAROTOMY  1984   OVARIAN CYST   TOTAL HIP ARTHROPLASTY Right 04/23/2021    Procedure: TOTAL HIP ARTHROPLASTY ANTERIOR APPROACH;  Surgeon: Kathlynn Sharper, MD;  Location: [facility] ORS;  Service: Orthopedics;  Laterality: Right;    Social History   Socioeconomic History   Marital status: Widowed    Spouse name: Not on file   Number of children: 1   Years of education: Not on file   Highest education level: High school graduate  Occupational History   Occupation: disability  Tobacco Use   Smoking status: Every Day    Current packs/day: 0.50    Average packs/day: 0.5 packs/day for 40.0 years (20.0 ttl pk-yrs)    Types: Cigarettes   Smokeless tobacco: Never  Vaping Use   Vaping status: Never Used  Substance and Sexual Activity   Alcohol use: No    Alcohol/week: 0.0 standard drinks of alcohol   Drug use: No   Sexual activity: Not on file  Other Topics Concern   Not on file  Social History Narrative   Not on file   Social Drivers of Health   Financial Resource Strain: Low Risk  (10/15/2020)   Overall Financial Resource Strain (CARDIA)    Difficulty of Paying Living Expenses: Not very hard  Food Insecurity: No Food Insecurity (12/10/2021)   Hunger Vital Sign    Worried About Running Out of Food in the Last Year: Never true    Ran Out of Food in the Last Year: Never true  Transportation Needs: No Transportation Needs (12/10/2021)   PRAPARE - Transportation  Lack of Transportation (Medical): No    Lack of Transportation (Non-Medical): No  Physical Activity: Inactive (10/16/2020)   Exercise Vital Sign    Days of Exercise per Week: 0 days    Minutes of Exercise per Session: 0 min  Stress: Stress Concern Present (12/10/2021)   [employer] of Occupational Health - Occupational Stress Questionnaire    Feeling of Stress : Rather much  Social Connections: Socially Isolated (10/16/2020)   Social Connection and Isolation Panel    Frequency of Communication with Friends and Family: Once a week    Frequency of Social Gatherings with Friends and Family: Never     Attends Religious Services: Never    Database administrator or Organizations: No    Attends Banker Meetings: Never    Marital Status: Widowed  Intimate Partner Violence: Not At Risk (10/16/2020)   Humiliation, Afraid, Rape, and Kick questionnaire    Fear of Current or Ex-Partner: No    Emotionally Abused: No    Physically Abused: No    Sexually Abused: No    Family History  Problem Relation Age of Onset   CVA Mother    Hypercholesterolemia Mother    Depression Mother    Rheum arthritis Mother    Deep vein thrombosis Mother    Bladder Cancer Father    Diverticulitis Father    Pulmonary embolism Father    Lung cancer Father    Deep vein thrombosis Sister    Colon polyps Sister    Atrial fibrillation Sister    Muscular dystrophy Sister    Cancer Sister        lung spreading all over   Diabetes Son        type 2    Allergies  Allergen Reactions   Anesthetics, Halogenated Other (See Comments)    Other reaction(s): Other (See Comments)   Ciprofloxacin     swelling   Methimazole     hives   Oxycodone -Acetaminophen  Nausea And Vomiting   Penicillins Other (See Comments)    RXN AS A CHILD    Outpatient Medications Prior to Visit  Medication Sig   levothyroxine  (SYNTHROID ) 100 MCG tablet TAKE 1 TABLET BY MOUTH EVERY DAY   rosuvastatin  (CRESTOR ) 5 MG tablet TAKE 1 TABLET (5 MG TOTAL) BY MOUTH DAILY.   busPIRone  (BUSPAR ) 15 MG tablet TAKE 1 TABLET BY MOUTH 2 TIMES DAILY. (Patient not taking: Reported on 06/22/2024)   carbamide peroxide (DEBROX) 6.5 % OTIC solution Place 5 drops into the left ear 2 (two) times daily. (Patient not taking: Reported on 06/22/2024)   fluticasone  (FLONASE ) 50 MCG/ACT nasal spray SPRAY 1 SPRAY INTO BOTH NOSTRILS DAILY. (Patient not taking: Reported on 06/22/2024)   loratadine  (CLARITIN ) 10 MG tablet TAKE 1 TABLET BY MOUTH EVERY DAY (Patient not taking: Reported on 06/22/2024)   mirtazapine  (REMERON ) 15 MG tablet TAKE 1 TABLET BY MOUTH  EVERYDAY AT BEDTIME (Patient not taking: Reported on 06/22/2024)   pantoprazole  (PROTONIX ) 20 MG tablet TAKE 1 TABLET BY MOUTH EVERY DAY (Patient not taking: Reported on 06/22/2024)   No facility-administered medications prior to visit.    Review of Systems  Constitutional: Negative.   HENT:  Positive for congestion. Negative for sinus pressure, sinus pain and sore throat.   Eyes: Negative.   Respiratory:  Positive for cough. Negative for sputum production and shortness of breath.   Cardiovascular: Negative.  Negative for chest pain.  Gastrointestinal: Negative.  Negative for abdominal pain, constipation and diarrhea.  Genitourinary:  Negative.   Musculoskeletal:  Negative for joint pain and myalgias.  Skin: Negative.   Neurological: Negative.  Negative for dizziness and headaches.  Endo/Heme/Allergies: Negative.   All other systems reviewed and are negative.      Objective:   BP 126/64   Pulse 84   Ht 5' 2 (1.575 m)   Wt 143 lb 12.8 oz (65.2 kg)   SpO2 97%   BMI 26.30 kg/m   Vitals:   06/22/24 1328  BP: 126/64  Pulse: 84  Height: 5' 2 (1.575 m)  Weight: 143 lb 12.8 oz (65.2 kg)  SpO2: 97%  BMI (Calculated): 26.29    Physical Exam Vitals and nursing note reviewed.  Constitutional:      Appearance: Normal appearance. She is normal weight.  HENT:     Head: Normocephalic and atraumatic.     Nose: Nose normal.     Mouth/Throat:     Mouth: Mucous membranes are moist.  Eyes:     Extraocular Movements: Extraocular movements intact.     Conjunctiva/sclera: Conjunctivae normal.     Pupils: Pupils are equal, round, and reactive to light.  Cardiovascular:     Rate and Rhythm: Normal rate and regular rhythm.     Pulses: Normal pulses.     Heart sounds: Normal heart sounds.  Pulmonary:     Effort: Pulmonary effort is normal.     Breath sounds: No stridor. Wheezing present. No rhonchi or rales.  Abdominal:     General: Abdomen is flat. Bowel sounds are normal.      Palpations: Abdomen is soft.  Musculoskeletal:        General: Normal range of motion.     Cervical back: Normal range of motion.  Skin:    General: Skin is warm and dry.  Neurological:     General: No focal deficit present.     Mental Status: She is alert and oriented to person, place, and time.  Psychiatric:        Mood and Affect: Mood normal.        Behavior: Behavior normal.        Thought Content: Thought content normal.        Judgment: Judgment normal.      No results found for any visits on 06/22/24.  No results found for this or any previous visit (from the past 2160 hours).    Assessment & Plan:  Chest xray today Z-pack Medrol  dose pack Tessalon pearls Mucinex Flonase  Drink plenty of water  Problem List Items Addressed This Visit       Respiratory   Acute URI - Primary   Relevant Medications   azithromycin  (ZITHROMAX  Z-PAK) 250 MG tablet   Other Relevant Orders   DG Chest 2 View   Other Visit Diagnoses       Acute cough       Relevant Orders   DG Chest 2 View       Return if symptoms worsen or fail to improve, for reschedule missed appt with NK.   Total time spent: 25 minutes  [employer], NP  06/22/2024   This document may have been prepared by [employer] Voice Recognition software and as such may include unintentional dictation errors.

## 2024-06-24 ENCOUNTER — Emergency Department

## 2024-06-24 ENCOUNTER — Encounter: Payer: Self-pay | Admitting: Emergency Medicine

## 2024-06-24 ENCOUNTER — Other Ambulatory Visit: Payer: Self-pay

## 2024-06-24 DIAGNOSIS — J040 Acute laryngitis: Secondary | ICD-10-CM | POA: Insufficient documentation

## 2024-06-24 DIAGNOSIS — R070 Pain in throat: Secondary | ICD-10-CM | POA: Diagnosis present

## 2024-06-24 DIAGNOSIS — K2100 Gastro-esophageal reflux disease with esophagitis, without bleeding: Secondary | ICD-10-CM | POA: Diagnosis not present

## 2024-06-24 DIAGNOSIS — E039 Hypothyroidism, unspecified: Secondary | ICD-10-CM | POA: Diagnosis not present

## 2024-06-24 DIAGNOSIS — R221 Localized swelling, mass and lump, neck: Secondary | ICD-10-CM | POA: Diagnosis not present

## 2024-06-24 DIAGNOSIS — R1110 Vomiting, unspecified: Secondary | ICD-10-CM | POA: Diagnosis not present

## 2024-06-24 NOTE — ED Triage Notes (Signed)
 Pt arrives from home POV C/O partial throat  obstruction x 2 days.  Respirations unlabored but pt having coughing fits and feeling something stuck in throat.  O2 sats 97% RA.  Pt states she is able to eat and drink but feels acid reflux, vomited past 2 nights. States she started ABX and prednisone for possible URI Saturday.

## 2024-06-25 ENCOUNTER — Emergency Department

## 2024-06-25 ENCOUNTER — Emergency Department
Admission: EM | Admit: 2024-06-25 | Discharge: 2024-06-25 | Disposition: A | Discharge status: Home / Self Care | Attending: Emergency Medicine | Admitting: Emergency Medicine

## 2024-06-25 DIAGNOSIS — J040 Acute laryngitis: Secondary | ICD-10-CM

## 2024-06-25 DIAGNOSIS — R1110 Vomiting, unspecified: Secondary | ICD-10-CM | POA: Diagnosis not present

## 2024-06-25 DIAGNOSIS — R09A2 Foreign body sensation, throat: Secondary | ICD-10-CM | POA: Diagnosis not present

## 2024-06-25 DIAGNOSIS — R059 Cough, unspecified: Secondary | ICD-10-CM | POA: Diagnosis not present

## 2024-06-25 DIAGNOSIS — K2100 Gastro-esophageal reflux disease with esophagitis, without bleeding: Secondary | ICD-10-CM

## 2024-06-25 LAB — COMPREHENSIVE METABOLIC PANEL WITH GFR
ALT: 41 U/L (ref 0–44)
AST: 32 U/L (ref 15–41)
Albumin: 3.9 g/dL (ref 3.5–5.0)
Alkaline Phosphatase: 67 U/L (ref 38–126)
Anion gap: 15 (ref 5–15)
BUN: 17 mg/dL (ref 8–23)
CO2: 24 mmol/L (ref 22–32)
Calcium: 9.2 mg/dL (ref 8.9–10.3)
Chloride: 104 mmol/L (ref 98–111)
Creatinine, Ser: 0.36 mg/dL — ABNORMAL LOW (ref 0.44–1.00)
GFR, Estimated: >60 mL/min (ref >60)
Glucose, Bld: 114 mg/dL — ABNORMAL HIGH (ref 70–99)
Potassium: 4.3 mmol/L (ref 3.5–5.1)
Sodium: 143 mmol/L (ref 135–145)
Total Bilirubin: 0.5 mg/dL (ref 0.0–1.2)
Total Protein: 7.5 g/dL (ref 6.5–8.1)

## 2024-06-25 LAB — CBC WITH DIFFERENTIAL/PLATELET
Abs Immature Granulocytes: 0.04 K/uL (ref 0.00–0.07)
Basophils Absolute: 0 K/uL (ref 0.0–0.1)
Basophils Relative: 0 %
Eosinophils Absolute: 0.1 K/uL (ref 0.0–0.5)
Eosinophils Relative: 1 %
HCT: 38.4 % (ref 36.0–46.0)
Hemoglobin: 12.8 g/dL (ref 12.0–15.0)
Immature Granulocytes: 0 %
Lymphocytes Relative: 21 %
Lymphs Abs: 2.1 K/uL (ref 0.7–4.0)
MCH: 32.2 pg (ref 26.0–34.0)
MCHC: 33.3 g/dL (ref 30.0–36.0)
MCV: 96.5 fL (ref 80.0–100.0)
Monocytes Absolute: 0.4 K/uL (ref 0.1–1.0)
Monocytes Relative: 4 %
Neutro Abs: 7.4 K/uL (ref 1.7–7.7)
Neutrophils Relative %: 74 %
Platelets: 315 K/uL (ref 150–400)
RBC: 3.98 MIL/uL (ref 3.87–5.11)
RDW: 12.6 % (ref 11.5–15.5)
WBC: 9.9 K/uL (ref 4.0–10.5)
nRBC: 0 % (ref 0.0–0.2)

## 2024-06-25 MED ORDER — ALUM & MAG HYDROXIDE-SIMETH 200-200-20 MG/5ML PO SUSP
30.0000 mL | Freq: Once | ORAL | Status: AC
Start: 2024-06-25 — End: 2024-06-25
  Administered 2024-06-25: 30 mL via ORAL
  Filled 2024-06-25: qty 30

## 2024-06-25 MED ORDER — IOHEXOL 300 MG/ML  SOLN
75.0000 mL | Freq: Once | INTRAMUSCULAR | Status: AC | PRN
  Administered 2024-06-25: 75 mL via INTRAVENOUS

## 2024-06-25 MED ORDER — LIDOCAINE VISCOUS HCL 2 % MT SOLN
15.0000 mL | Freq: Once | OROMUCOSAL | Status: AC
Start: 2024-06-25 — End: 2024-06-25
  Administered 2024-06-25: 15 mL via ORAL
  Filled 2024-06-25: qty 15

## 2024-06-25 MED ORDER — DEXAMETHASONE SODIUM PHOSPHATE 10 MG/ML IJ SOLN
10.0000 mg | Freq: Once | INTRAMUSCULAR | Status: AC
  Administered 2024-06-25: 10 mg via INTRAVENOUS
  Filled 2024-06-25: qty 1

## 2024-06-25 MED ORDER — PANTOPRAZOLE SODIUM 40 MG PO TBEC: 40.0000 mg | DELAYED_RELEASE_TABLET | Freq: Two times a day (BID) | ORAL | 0 refills | Status: DC

## 2024-06-25 NOTE — ED Provider Notes (Signed)
 [facility] Provider Note    Event Date/Time   First MD Initiated Contact with Patient 06/25/24 0100     (approximate)   History   Chief Complaint Foreign body in throat   HPI  Tara Scott is a 63 y.o. female with past medical history of DVT, hypothyroidism, GERD, and anxiety who presents to the ED complaining of foreign body in throat.  Patient reports that she has been dealing with reflux and postnasal drip with cough for the past 7 days, started on antibiotics and steroids by her PCP for this.  Over the past 2 days, she has developed increasing irritation in the back of her throat as well as hoarseness in her voice.  She denies any shortness of breath, but states it feels like my breath stops when she goes to cough.  She has not had any difficulty breathing outside of when she is coughing.  She denies any difficulty swallowing and has been eating and drinking normally.     Physical Exam   Triage Vital Signs: ED Triage Vitals  Encounter Vitals Group     BP 06/24/24 2229 (!) 164/106     Girls Systolic BP Percentile --      Girls Diastolic BP Percentile --      Boys Systolic BP Percentile --      Boys Diastolic BP Percentile --      Pulse Rate 06/24/24 2229 83     Resp 06/24/24 2229 20     Temp --      Temp src --      SpO2 06/24/24 2229 97 %     Weight 06/24/24 2237 143 lb (64.9 kg)     Height 06/24/24 2237 5' 2 (1.575 m)     Head Circumference --      Peak Flow --      Pain Score 06/24/24 2237 0     Pain Loc --      Pain Education --      Exclude from Growth Chart --     Most recent vital signs: Vitals:   06/25/24 0042 06/25/24 0315  BP: (!) 141/69   Pulse: 79 69  Resp: (!) 22   SpO2: 100% 96%    Constitutional: Alert and oriented. Eyes: Conjunctivae are normal. Head: Atraumatic. Nose: No congestion/rhinnorhea. Mouth/Throat: Mucous membranes are moist.  Posterior oropharynx clear.  Hoarse voice noted. Neck: No lymphadenopathy  or tenderness noted. Cardiovascular: Normal rate, regular rhythm. Grossly normal heart sounds.  2+ radial pulses bilaterally. Respiratory: Normal respiratory effort.  No retractions. Lungs CTAB. Gastrointestinal: Soft and nontender. No distention. Musculoskeletal: No lower extremity tenderness nor edema.  Neurologic:  Normal speech and language. No gross focal neurologic deficits are appreciated.    ED Results / Procedures / Treatments   Labs (all labs ordered are listed, but only abnormal results are displayed) Labs Reviewed  COMPREHENSIVE METABOLIC PANEL WITH GFR - Abnormal; Notable for the following components:      Result Value   Glucose, Bld 114 (*)    Creatinine, Ser 0.36 (*)    All other components within normal limits  CBC WITH DIFFERENTIAL/PLATELET     RADIOLOGY CT soft tissue neck reviewed and interpreted by me with no inflammatory changes or edema noted.  PROCEDURES:  Critical Care performed: No  Procedures   MEDICATIONS ORDERED IN ED: Medications  alum & mag hydroxide-simeth (MAALOX/MYLANTA) 200-200-20 MG/5ML suspension 30 mL (30 mLs Oral Given 06/25/24 0206)    And  lidocaine  (XYLOCAINE ) 2 % viscous mouth solution 15 mL (15 mLs Oral Given 06/25/24 0149)  iohexol  (OMNIPAQUE ) 300 MG/ML solution 75 mL (75 mLs Intravenous Contrast Given 06/25/24 0247)  dexamethasone  (DECADRON ) injection 10 mg (10 mg Intravenous Given 06/25/24 0346)     IMPRESSION / MDM / ASSESSMENT AND PLAN / ED COURSE  I reviewed the triage vital signs and the nursing notes.                              63 y.o. female with past medical history of DVT, hypothyroidism, GERD, and anxiety who presents to the ED with 2 days of cough and voice change with feeling like something is stuck in the back of her throat.  Patient's presentation is most consistent with acute presentation with potential threat to life or bodily function.  Differential diagnosis includes, but is not limited to, laryngitis,  vocal cord dysfunction, globus sensation, reflux, tonsillitis, epiglottitis.  Patient nontoxic-appearing and in no acute distress, vital signs are unremarkable.  She is noted to have hoarse voice and frequent cough, but no difficulty breathing outside of when she is actively coughing.  Examination of her posterior oropharynx is unremarkable, however x-ray of her neck with questionable swelling around her larynx.  We will further assess with CT imaging, lab results are pending at this time.  Esophageal source of her symptoms seems less likely, but will trial GI cocktail.  CT soft tissue neck is unremarkable, no evidence of infection or other swelling.  Patient feels better following GI cocktail, suspect that she has GERD contributing to laryngitis.  She was given IV Decadron  for laryngitis, will start PPI for GERD.  She was counseled to follow-up with her PCP and to return to the ED for new or worsening symptoms, patient agrees with plan.      FINAL CLINICAL IMPRESSION(S) / ED DIAGNOSES   Final diagnoses:  Laryngitis  Gastroesophageal reflux disease with esophagitis without hemorrhage     Rx / DC Orders   ED Discharge Orders          Ordered    pantoprazole  (PROTONIX ) 40 MG tablet  2 times daily before meals        06/25/24 0359             Note:  This document was prepared using Dragon voice recognition software and may include unintentional dictation errors.   Willo Dunnings, MD 06/25/24 0400

## 2024-06-29 DIAGNOSIS — J383 Other diseases of vocal cords: Secondary | ICD-10-CM | POA: Diagnosis not present

## 2024-06-29 DIAGNOSIS — K219 Gastro-esophageal reflux disease without esophagitis: Secondary | ICD-10-CM | POA: Diagnosis not present

## 2024-07-12 ENCOUNTER — Other Ambulatory Visit: Payer: Self-pay

## 2024-07-12 ENCOUNTER — Emergency Department

## 2024-07-12 ENCOUNTER — Emergency Department
Admission: EM | Admit: 2024-07-12 | Discharge: 2024-07-12 | Disposition: A | Discharge status: Home / Self Care | Attending: Emergency Medicine | Admitting: Emergency Medicine

## 2024-07-12 DIAGNOSIS — K2100 Gastro-esophageal reflux disease with esophagitis, without bleeding: Secondary | ICD-10-CM | POA: Diagnosis not present

## 2024-07-12 DIAGNOSIS — F17200 Nicotine dependence, unspecified, uncomplicated: Secondary | ICD-10-CM | POA: Diagnosis not present

## 2024-07-12 DIAGNOSIS — R112 Nausea with vomiting, unspecified: Secondary | ICD-10-CM | POA: Diagnosis present

## 2024-07-12 DIAGNOSIS — R1110 Vomiting, unspecified: Secondary | ICD-10-CM | POA: Diagnosis not present

## 2024-07-12 DIAGNOSIS — Z96641 Presence of right artificial hip joint: Secondary | ICD-10-CM | POA: Diagnosis not present

## 2024-07-12 MED ORDER — IOHEXOL 300 MG/ML  SOLN
150.0000 mL | Freq: Once | INTRAMUSCULAR | Status: AC | PRN
  Administered 2024-07-12: 150 mL via ORAL

## 2024-07-12 NOTE — ED Triage Notes (Signed)
 Pt was seen earlier in October and has followed up with ent, cough congestion, nasal drainage, states coughing and then begins to vomit, used the flexible laryngoscope at the ENT. Follow up with GI not until March, pt reports not any better

## 2024-07-12 NOTE — Discharge Instructions (Addendum)
 These call our GI clinics above to attempt earlier appointment.  Additionally I have placed an urgent referral to our clinic.  You may also wish to follow-up with the surgery clinic for further discussion on treatment options as well.  Please return to the emergency room right away if you are to develop a fever, difficulty breathing, cannot swallow, severe nausea, your pain becomes severe or worsens, you are unable to keep food down, begin vomiting any dark or bloody fluid, you develop any dark or bloody stools, feel dehydrated, or other new concerns or symptoms arise.

## 2024-07-12 NOTE — ED Notes (Signed)
 Pt given discharge paperwork and is awaiting her ride. Pt requested to stay in room until her ride arrives.

## 2024-07-12 NOTE — ED Provider Notes (Signed)
 [facility] Provider Note    Event Date/Time   First MD Initiated Contact with Patient 07/12/24 [phone]     (approximate)   History   Cough, nasal drainage, and Gastroesophageal Reflux   HPI  Tara Scott is a 63 y.o. female with a history of hyper thyroidism, reflux DVT   Patient for a bit over a month now is been experiencing of daily feeling of like she has irritated vocal cords or laryngitis.  It is bothersome to her.  Often times over the last month after eating large meals she will feel nauseated and vomit.  She still able to eat eat breakfast eat lunch but often in the evening she will throw up.  Still able to drink fluid and things like chicken soup and things of that nature okay, but occasionally throwing them up.  She has lost about 3 pounds in the last month.  She has no abdominal pain no fevers.  No neck pain.  She feels like feeling like she just constantly need to clear her throat and her vocal cords are irritated  She was seen in the ER for the same and its persisted.  She followed up with ENT and they looked with a scope down and told her her vocal cords were inflamed and recommended she stay on acid reflux medicine and to follow-up with GI  At this time she is not able to secure a GI appointment till at least March, and advises that the symptoms keep going and they seem to be just constantly getting worse  She took a daily PPI twice daily for 2 weeks and now she is taking it once a day as recommended by the ENT  Patient here today wondering if she can be seen by GI specialist today she cannot wait till March  Physical Exam   Triage Vital Signs: ED Triage Vitals  Encounter Vitals Group     BP 07/12/24 0911 (!) 158/68     Girls Systolic BP Percentile --      Girls Diastolic BP Percentile --      Boys Systolic BP Percentile --      Boys Diastolic BP Percentile --      Pulse Rate 07/12/24 0911 92     Resp 07/12/24 0911 16     Temp  07/12/24 0911 97.6 F (36.4 C)     Temp Source 07/12/24 0911 Oral     SpO2 07/12/24 0911 100 %     Weight 07/12/24 0913 142 lb 13.7 oz (64.8 kg)     Height 07/12/24 0913 5' 2 (1.575 m)     Head Circumference --      Peak Flow --      Pain Score 07/12/24 0913 0     Pain Loc --      Pain Education --      Exclude from Growth Chart --     Most recent vital signs: Vitals:   07/12/24 1000 07/12/24 1130  BP: 119/68 (!) 119/57  Pulse: 63 78  Resp: 18 20  Temp:    SpO2: 100% 100%   There is no shortness of breath there is no chest pain.  When the feeling in her vocal cords happens it makes her cough and occasionally will lead her to vomit  General: Awake, no distress.  Posterior oropharynx with slight injection.  No nasal discharge.  The posterior oropharynx is widely patent no masses are evident.  She handles her secretions without  issue.  She swallows without difficulty.  Drinking couple water CV:  Good peripheral perfusion.  Normal tones and rate Resp:  Normal effort.  Clear bilateral no noted gastric sounds in the thoracic cavity Abd:  No distention.  Soft nontender nondistended throughout. Other:  Warm well-perfused.  Mucous membranes are moist   ED Results / Procedures / Treatments   Labs (all labs ordered are listed, but only abnormal results are displayed) Labs Reviewed - No data to display   RADIOLOGY   DG UGI W SINGLE CM (SOL OR THIN BA) Result Date: 07/12/2024 CLINICAL DATA:  Patient presented to the ED today with worsening reflux, poor appetite, daily vomiting. Recent flexible laryngoscopy unremarkable, GI appointment planned for March. Request for UGI to assess for possible obstruction and to further characterize reflux. EXAM: DG UGI W SINGLE CM TECHNIQUE: Scout radiograph was obtained. Single contrast examination was performed using water soluble contrast. This exam was performed by Clotilda Hesselbach, PA-C, and was supervised and interpreted by Cordella Banner, MD.  FLUOROSCOPY: Radiation Exposure Index (as provided by the fluoroscopic device): 55.20 mGy Kerma COMPARISON:  DG Abd/chest 07/12/24, CT neck w/contrast 06/25/24. FINDINGS: Scout Radiograph: Non-specific bowel gas pattern, no pneumoperitoneum. Esophagus: Normal appearance. Question possible small esophageal web without significant intraluminal narrowing (series #2, images 26-28 and series #4 images 30-33). Mild smooth narrowing of the gastroesophageal junction. Esophageal motility: Mild lower esophageal dysmotility - unable to fully assess motility due to patient's inability to lay prone. Gastroesophageal reflux: Significant reflux noted in the standing position without provocative maneuvers (images #8, #9 and #12) Ingested 13 mm barium tablet:  Not given on problem focused exam Stomach: Normal appearance, no stricture. Very small sliding type hiatal hernia. Gastric emptying: Normal. Duodenum: Normal appearance. Contrast observed easily flowing through duodenum and into jejunum. Other: Exam somewhat limited due to patient with mobility issues preventing her from playing prone or standing completely upright. IMPRESSION: Single contrast problem focused UGI significant for: 1. Significant gastroesophageal reflux in the standing position without provocative maneuvers. 2.  Mild lower esophageal dysmotility. 3.  Very small sliding type hiatal hernia. 4.  Mild smooth narrowing of the gastroesophageal junction. 5. 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 Consider modified barium swallow if further evaluation of this area is desired. 6. Contrast flows freely through the esophagus, stomach and duodenum indicating no obstruction in the upper GI tract. Electronically Signed   By: Cordella Banner   On: 07/12/2024 15:09   DG Abdomen Acute W/Chest Result Date: 07/12/2024 EXAM: UPRIGHT AND SUPINE XRAY VIEWS OF THE ABDOMEN AND 4 VIEW(S) OF THE CHEST 07/12/2024 10:05:59 AM COMPARISON: 06/22/2024  CLINICAL HISTORY: 63 year old female. Severe reflux, occasional vomiting (? hiatal hernia) or obstruction. Smoker. FINDINGS: LUNGS AND PLEURA: Lung volumes remain within normal limits. Stable mild pulmonary interstitial changes. No consolidation or pulmonary edema. No pleural effusion or pneumothorax. HEART AND MEDIASTINUM: Mediastinal contours remain within normal limits. No acute abnormality of the cardiac and mediastinal silhouettes. BOWEL: Nonobstructive bowel gas pattern. No bowel obstruction. PERITONEUM AND SOFT TISSUES: Abdominal and pelvic visceral contours are within normal limits. No abnormal calcifications. No free air. BONES: Partially visualized right total hip arthroplasty. No acute osseous abnormality. IMPRESSION: 1. Nonobstructive bowel gas pattern without pneumoperitoneum. 2. No acute cardiopulmonary abnormality. 3. No radiographic evidence of hiatal hernia. Electronically signed by: Helayne Hurst MD 07/12/2024 10:41 AM EDT RP Workstation: [id number]      Patient has had this symptomatology previous/chronically.  It does appear she has had workup  including CT imaging of the neck.  PROCEDURES:  Critical Care performed: No  Procedures   MEDICATIONS ORDERED IN ED: Medications  iohexol  (OMNIPAQUE ) 300 MG/ML solution 150 mL (150 mLs Oral Contrast Given 07/12/24 1343)     IMPRESSION / MDM / ASSESSMENT AND PLAN / ED COURSE  I reviewed the triage vital signs and the nursing notes.                              Differential diagnosis includes, but is not limited to, possible symptoms of reflux esophagitis, irritation of the laryngeal cords, postnasal drip etc.  Her symptoms do not seem to align with a chronic infection or viral illness at this point given the chronicity.  She is been evaluated by ENT and felt per the patient description at this was likely GI or reflux related.  Despite treatment for reflux her symptoms seem to persist.  She reports 3 pound weight loss in a month but she  still able to eat and hydrate but occasionally having vomiting  No obvious findings of acute intra-abdominal concern her abdomen is soft nontender nondistended.  She denies abdominal pain she is not having fevers.  There is no chest pain.  Patient's presentation is most consistent with acute complicated illness / injury requiring diagnostic workup.      Clinical Course as of 07/12/24 1556  Thu Jul 12, 2024  9055 Discussed with patient and her son, will obtain abdominal plain films to evaluate for possible causes such as advancement of her small hiatal hernia, large hiatal hernia obstructive findings although my pretest probability for these is quite low. [MQ]  1009 Dr. Milissa of ENT was able to look at the patient's records from visit there, he advised the patient was diagnosed with reflux and muscle tension dysphonia.  Their recommendation is that she need to follow-up with GI [MQ]  1036 Discussed clinical history with our gastroenterologist Dr. Jinny.  Reviewed her recent ED workups, ENT visit and her clinical symptoms.  He advises that if the patient can have a upper GI series, ordered as he advises, and it does not show obstruction that she would be a good candidate for continued outpatient follow-up [MQ]  1102 Radiology department advises they have spoken to the radiology physician assistant and are planning to perform upper GI series as directed by Dr. Jinny [MQ]  [phone] Per radiology PA Neita 'Just finished her UGI - no obstruction, contrast flowed to jejunum easily. She does have pretty significant reflux even without provocative measures and some esophageal dysmotility, but otherwise nothing crazy.' [MQ]  1359 Dr. Jinny has reviewed results of upper GI series.  Recommends follow-up with GI outpatient, and also to follow-up with general surgery for potential review for surgical treatment options [MQ]    Clinical Course User Index [MQ] Dicky Anes, MD   Return precautions and treatment  recommendations and follow-up discussed with the patient who is agreeable with the plan.   FINAL CLINICAL IMPRESSION(S) / ED DIAGNOSES   Final diagnoses:  Gastroesophageal reflux disease with esophagitis without hemorrhage     Rx / DC Orders   ED Discharge Orders          Ordered    Ambulatory referral to Gastroenterology       Comments: Mild referral for evaluation due to severe reflux type symptoms.  Patient previously recommended to have upper endoscopy   07/12/24 1356  Note:  This document was prepared using Dragon voice recognition software and may include unintentional dictation errors.   Dicky Anes, MD 07/12/24 [phone]

## 2024-07-24 NOTE — Progress Notes (Unsigned)
 07/25/2024 [place] CARMACK [id number] 07/19/1961  Gastroenterology Office Note    Referring Provider: Dicky Anes, MD Primary Care Physician:  Fernand Fredy RAMAN, MD  Primary GI Provider: Jinny Carmine, MD    Chief Complaint   Chief Complaint  Patient presents with   New Patient (Initial Visit)    Gerd-throat clearing-coughing-abd pain last night-burning sensation-also has constipation- once a week BM'se takes miralx but not on a daily basis-     History of Present Illness   MAUDELL STANBROUGH is a 63 y.o. female with PMHX of GERD, muscular dystrophy, anxiety presenting today at the request of Dicky Anes, MD due to GERD, abdominal pain/burning, and constipation.   Patient states she has been having problems with acid reflux for over a year, but things have been worse since the end of September.  States she went to the emergency room because she felt like something was stuck in her throat of October and then followed up with ENT for laryngitis.  States she is still having a lot of reflux and she will start coughing and start vomiting.  Also feels like a lump or something is in her throat and she has to clear her throat alot.  She states that at night it feels worse, she will start to belch and then food or yellowish liquid will come up.  She is now taking pantoprazole  once a day after taking it for 2 weeks twice a day.  Occasionally has some epigastric burning.  She does not eat fried or spicy foods, does not drink sodas, caffeine, or alcohol.  Rare NSAID use. She only drinks about 2 bottles of water a day. Denies hematemesis or weight loss.     Patient also reports having constipation. She only has a bowel movement once a week.  Constipation has become worse over the last couple of months.  She is only taken MiraLAX  3-4 times in the past month, never on a daily basis. She has taken stimulant laxatives in the past but it made her feel like she was going to pass out.  Her stool is hard and she has  to strain.  Denies melena or hematochezia.    Patient see at emergency room on 07/12/2024 for cough, nasal drainage, and worsening GERD, daily vomiting. Was taking PPI bid then down to once daily.   07/12/2024 Single contrast problem focused UGI significant for:   1. Significant gastroesophageal reflux in the standing position without provocative maneuvers. 2.  Mild lower esophageal dysmotility. 3.  Very small sliding type hiatal hernia. 4.  Mild smooth narrowing of the gastroesophageal junction. 5. 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 Consider modified barium swallow if further evaluation of this area is desired. 6. Contrast flows freely through the esophagus, stomach and duodenum indicating no obstruction in the upper GI tract.  Abd xray  IMPRESSION: 1. Nonobstructive bowel gas pattern without pneumoperitoneum. 2. No acute cardiopulmonary abnormality. 3. No radiographic evidence of hiatal hernia.  Patient seen in the emergency room on 06/25/2024 for complaints of foreign body in throat.   06/25/2024 CT Neck with contrast IMPRESSION: 1. No acute findings or discrete mass in the neck.  03/11/2016 Colonoscopy - One diminutive polyp in the proximal transverse colon, removed with a jumbo cold forceps. Resected and retrieved. - Three diminutive polyps in the rectum, removed with a jumbo cold forceps. Resected and retrieved. - One small polyp in the rectum, removed with a cold snare. Resected and retrieved.  - The  examination was otherwise normal. - Internal hemorrhoids.  - HYPERPLASTIC POLYP (3).  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    Malignant hyperthermia    Toxic multinodular goiter     Past Surgical History:  Procedure Laterality Date   ADENOIDECTOMY     CESAREAN SECTION     COLONOSCOPY WITH PROPOFOL  N/A 03/08/2016   Procedure: COLONOSCOPY WITH PROPOFOL ;  Surgeon: Lamar ONEIDA Holmes, MD;  Location: [facility] ENDOSCOPY;  Service: Endoscopy;  Laterality: N/A;   LAPAROTOMY  1984   OVARIAN CYST   TOTAL HIP ARTHROPLASTY Right 04/23/2021   Procedure: TOTAL HIP ARTHROPLASTY ANTERIOR APPROACH;  Surgeon: Kathlynn Sharper, MD;  Location: [facility] ORS;  Service: Orthopedics;  Laterality: Right;    Current Outpatient Medications  Medication Sig Dispense Refill   b complex vitamins capsule Take 1 capsule by mouth daily.     cholecalciferol (VITAMIN D3) 25 MCG (1000 UNIT) tablet Take 1,000 Units by mouth daily.     famotidine  (PEPCID ) 20 MG tablet Take 1 tablet (20 mg total) by mouth at bedtime. 30 tablet 2   levothyroxine  (SYNTHROID ) 100 MCG tablet TAKE 1 TABLET BY MOUTH EVERY DAY 90 tablet 3   pantoprazole  (PROTONIX ) 40 MG tablet Take 1 tablet (40 mg total) by mouth 2 (two) times daily before a meal for 14 days. 28 tablet 0   rosuvastatin  (CRESTOR ) 5 MG tablet TAKE 1 TABLET (5 MG TOTAL) BY MOUTH DAILY. 90 tablet 1   No current facility-administered medications for this visit.    Allergies as of 07/25/2024 - Review Complete 07/25/2024  Allergen Reaction Noted   Anesthetics, halogenated Other (See Comments) 01/01/2013   Ciprofloxacin  03/14/2015   Methimazole  03/14/2015   Oxycodone -acetaminophen  Nausea And Vomiting 07/29/2020   Penicillins Other (See Comments) 05/30/2015    Family History  Problem Relation Age of Onset   CVA Mother    Hypercholesterolemia Mother    Depression Mother    Rheum arthritis Mother    Deep vein thrombosis Mother    Bladder Cancer Father    Diverticulitis Father    Pulmonary embolism Father    Lung cancer Father     Deep vein thrombosis Sister    Colon polyps Sister    Atrial fibrillation Sister    Muscular dystrophy Sister    Cancer Sister        lung spreading all over   Diabetes Son        type 2    Social History   Socioeconomic History   Marital status: Widowed    Spouse name: Not on file   Number of children: 1   Years of education: Not on file   Highest education level: High school graduate  Occupational History   Occupation: disability  Tobacco Use   Smoking status: Every Day    Current packs/day: 0.50    Average packs/day: 0.5 packs/day for 40.0 years (20.0 ttl pk-yrs)    Types: Cigarettes   Smokeless tobacco: Never  Vaping Use   Vaping status: Never Used  Substance and Sexual Activity   Alcohol use: No  Alcohol/week: 0.0 standard drinks of alcohol   Drug use: No   Sexual activity: Not on file  Other Topics Concern   Not on file  Social History Narrative   Not on file   Social Drivers of Health   Financial Resource Strain: Low Risk  (10/15/2020)   Overall Financial Resource Strain (CARDIA)    Difficulty of Paying Living Expenses: Not very hard  Food Insecurity: No Food Insecurity (12/10/2021)   Hunger Vital Sign    Worried About Running Out of Food in the Last Year: Never true    Ran Out of Food in the Last Year: Never true  Transportation Needs: No Transportation Needs (12/10/2021)   PRAPARE - Administrator, Civil Service (Medical): No    Lack of Transportation (Non-Medical): No  Physical Activity: Inactive (10/16/2020)   Exercise Vital Sign    Days of Exercise per Week: 0 days    Minutes of Exercise per Session: 0 min  Stress: Stress Concern Present (12/10/2021)   [employer] of Occupational Health - Occupational Stress Questionnaire    Feeling of Stress : Rather much  Social Connections: Socially Isolated (10/16/2020)   Social Connection and Isolation Panel    Frequency of Communication with Friends and Family: Once a week    Frequency of  Social Gatherings with Friends and Family: Never    Attends Religious Services: Never    Database Administrator or Organizations: No    Attends Banker Meetings: Never    Marital Status: Widowed  Intimate Partner Violence: Not At Risk (10/16/2020)   Humiliation, Afraid, Rape, and Kick questionnaire    Fear of Current or Ex-Partner: No    Emotionally Abused: No    Physically Abused: No    Sexually Abused: No     RELEVANT GI HISTORY, IMAGING AND LABS: CBC    Component Value Date/Time   WBC 9.9 06/25/2024 0158   RBC 3.98 06/25/2024 0158   HGB 12.8 06/25/2024 0158   HGB 13.4 03/22/2024 1213   HCT 38.4 06/25/2024 0158   HCT 41.8 03/22/2024 1213   PLT 315 06/25/2024 0158   PLT 245 03/22/2024 1213   MCV 96.5 06/25/2024 0158   MCV 100 (H) 03/22/2024 1213   MCH 32.2 06/25/2024 0158   MCHC 33.3 06/25/2024 0158   RDW 12.6 06/25/2024 0158   RDW 11.8 03/22/2024 1213   LYMPHSABS 2.1 06/25/2024 0158   LYMPHSABS 1.7 03/22/2024 1213   MONOABS 0.4 06/25/2024 0158   EOSABS 0.1 06/25/2024 0158   EOSABS 0.1 03/22/2024 1213   BASOSABS 0.0 06/25/2024 0158   BASOSABS 0.0 03/22/2024 1213   Recent Labs    03/22/24 1213 06/25/24 0158  HGB 13.4 12.8    CMP     Component Value Date/Time   NA 143 06/25/2024 0158   NA 142 03/22/2024 1213   K 4.3 06/25/2024 0158   CL 104 06/25/2024 0158   CO2 24 06/25/2024 0158   GLUCOSE 114 (H) 06/25/2024 0158   BUN 17 06/25/2024 0158   BUN 14 03/22/2024 1213   CREATININE 0.36 (L) 06/25/2024 0158   CALCIUM  9.2 06/25/2024 0158   PROT 7.5 06/25/2024 0158   PROT 6.8 03/22/2024 1213   ALBUMIN 3.9 06/25/2024 0158   ALBUMIN 4.3 03/22/2024 1213   AST 32 06/25/2024 0158   ALT 41 06/25/2024 0158   ALKPHOS 67 06/25/2024 0158   BILITOT 0.5 06/25/2024 0158   BILITOT 0.3 03/22/2024 1213   GFRNONAA >60 06/25/2024 0158  GFRAA >60 08/30/2018 2340      Latest Ref Rng & Units 06/25/2024    1:58 AM 03/22/2024   12:13 PM 05/05/2023    3:24 PM   Hepatic Function  Total Protein 6.5 - 8.1 g/dL 7.5  6.8  6.5   Albumin 3.5 - 5.0 g/dL 3.9  4.3  4.3   AST 15 - 41 U/L 32  20  20   ALT 0 - 44 U/L 41  15  18   Alk Phosphatase 38 - 126 U/L 67  81  75   Total Bilirubin 0.0 - 1.2 mg/dL 0.5  0.3  0.3       Review of Systems   All systems reviewed and negative except where noted in HPI.    Physical Exam  BP (!) 154/79   Pulse 64   Temp 98.6 F (37 C)   Ht 5' 2 (1.575 m)   Wt 144 lb 3.2 oz (65.4 kg)   SpO2 99%   BMI 26.37 kg/m  No LMP recorded. Patient is postmenopausal. General:   Alert and oriented. Pleasant and cooperative. Well-nourished and well-developed. NAD. Head:  Normocephalic and atraumatic. Eyes:  Without icterus Ears:  Normal auditory acuity. Neck:  Supple; no masses or thyromegaly. Lungs:  Respirations even and unlabored.  Clear throughout to auscultation.   No wheezes, crackles, or rhonchi. No acute distress. Heart:  Regular rate and rhythm; no murmurs, clicks, rubs, or gallops. Abdomen:  Normal bowel sounds.  No bruits.  Soft, non-tender and non-distended without masses, hepatosplenomegaly or hernias noted.  No guarding or rebound tenderness.     Rectal:  Deferred. Msk:  Symmetrical without gross deformities. Normal posture. Extremities:  Without edema. Neurologic:  Alert and  oriented x4;  grossly normal neurologically. Skin:  Intact without significant lesions or rashes. Psych:  Alert and cooperative. Normal mood and affect.   Assessment & Plan   ANAID HANEY is a 63 y.o. female presenting today with epigastric burning, GERD, and constipation.    GERD with associated globus sensation and epigastric burning.  She will feel like acid is coming back up and starts coughing and will have to vomit.  UGI revealed significant GERD, mild lower esophageal dysmotility, very small sliding type hiatal hernia, mild smooth narrowing of the gastroesophageal junction. Patient started coughing during visit with some dry  heaving.  - lifestyle modifications discussed including eating smaller meals and taking smaller bites, avoid trigger foods, avoid eating 3 hours before bed. - Continue pantoprazole  40 mg once daily minutes prior to meal - Start famotidine  20 mg at bedtime - Schedule EGD in the near future to evaluate GERD, esophagitis, hiatal hernia,H pylori, esophageal strictures. I discussed risks of EGD with patient today, including risk of sedation, bleeding or perforation. Patient provides understanding and gave verbal consent to proceed.  Patient reports history of reaction to general anesthesia in the past with malignant hyperthermia.   Constipation  - Recommend High Fiber diet with fruits, vegetables, and whole grains. - Drink 64 ounces of Fluids Daily. - Start Miralax  Mix 1 capful in a drink daily. - Start Benefiber Mix 1 TBSP in a drink daily. - Start Colace (docusate sodium ) 100mg  1 capsule once daily. - If no improvement, consider Linzess, Amitiza or Trulance.   Follow up in 3 months    Grayce Bohr, DNP, AGNP-C [facility] Gastroenterology

## 2024-07-25 ENCOUNTER — Telehealth: Payer: Self-pay

## 2024-07-25 ENCOUNTER — Ambulatory Visit (INDEPENDENT_AMBULATORY_CARE_PROVIDER_SITE_OTHER): Admitting: Family Medicine

## 2024-07-25 ENCOUNTER — Encounter: Payer: Self-pay | Admitting: Family Medicine

## 2024-07-25 VITALS — BP 154/79 | HR 64 | Temp 98.6°F | Ht 62.0 in | Wt 144.2 lb

## 2024-07-25 DIAGNOSIS — K5909 Other constipation: Secondary | ICD-10-CM | POA: Diagnosis not present

## 2024-07-25 DIAGNOSIS — K219 Gastro-esophageal reflux disease without esophagitis: Secondary | ICD-10-CM | POA: Diagnosis not present

## 2024-07-25 DIAGNOSIS — R1013 Epigastric pain: Secondary | ICD-10-CM

## 2024-07-25 MED ORDER — FAMOTIDINE 20 MG PO TABS: 20.0000 mg | ORAL_TABLET | Freq: Every day | ORAL | 2 refills | Status: DC

## 2024-07-25 NOTE — Patient Instructions (Addendum)
 Discussed constipation treatment at length. Recommend High Fiber diet with fruits, vegetables, and whole grains. Drink 64 ounces of Fluids Daily.  Start Miralax  Mix 1 capful in a drink daily. Start Benefiber Mix 1 TBSP in a drink daily. Can also start Colace (docusate sodium ) 100mg  1 capsule once daily if stools are hard.

## 2024-07-25 NOTE — Telephone Encounter (Signed)
 [phone]  Pt stated she left the office without the information that was supposed to be given to here. Pt said that she was supposed to get a fiber list. She thinks.

## 2024-07-31 ENCOUNTER — Ambulatory Visit: Admitting: Internal Medicine

## 2024-08-02 ENCOUNTER — Encounter: Payer: Self-pay | Admitting: Internal Medicine

## 2024-08-02 ENCOUNTER — Ambulatory Visit (INDEPENDENT_AMBULATORY_CARE_PROVIDER_SITE_OTHER): Admitting: Internal Medicine

## 2024-08-02 VITALS — BP 152/76 | HR 75 | Ht 62.0 in | Wt 142.2 lb

## 2024-08-02 DIAGNOSIS — E890 Postprocedural hypothyroidism: Secondary | ICD-10-CM

## 2024-08-02 DIAGNOSIS — I10 Essential (primary) hypertension: Secondary | ICD-10-CM | POA: Diagnosis not present

## 2024-08-02 DIAGNOSIS — K219 Gastro-esophageal reflux disease without esophagitis: Secondary | ICD-10-CM

## 2024-08-02 DIAGNOSIS — E782 Mixed hyperlipidemia: Secondary | ICD-10-CM | POA: Diagnosis not present

## 2024-08-02 DIAGNOSIS — F329 Major depressive disorder, single episode, unspecified: Secondary | ICD-10-CM

## 2024-08-02 DIAGNOSIS — G7100 Muscular dystrophy, unspecified: Secondary | ICD-10-CM

## 2024-08-02 DIAGNOSIS — F411 Generalized anxiety disorder: Secondary | ICD-10-CM | POA: Diagnosis not present

## 2024-08-02 DIAGNOSIS — Z23 Encounter for immunization: Secondary | ICD-10-CM

## 2024-08-02 MED ORDER — BUSPIRONE HCL 10 MG PO TABS: 10.0000 mg | ORAL_TABLET | Freq: Two times a day (BID) | ORAL | 3 refills | Status: DC

## 2024-08-02 MED ORDER — MIRTAZAPINE 30 MG PO TABS: 30.0000 mg | ORAL_TABLET | Freq: Every day | ORAL | 2 refills | Status: DC

## 2024-08-02 NOTE — Progress Notes (Signed)
 Established Patient Office Visit  Subjective:  Patient ID: Tara Scott, female    DOB: 17-May-1961  Age: 63 y.o. MRN: [id number]  Chief Complaint  Patient presents with   Hospitalization Follow-up    Patient comes in for follow follow-up after 2 emergency room visits during the month of October.  During her visit she presented with laryngitis and with acid reflux.  She was evaluated by ENT and GI advised that her symptoms are related to acid reflux.  Currently she is being scheduled for an EGD by her gastroenterologist.  She is taking her medications as prescribed.  Patient admits to being under a lot of stress these days.  Her blood pressure is also elevated.  She thinks it is due to her stress.  She is not taking her BuSpar  or Remeron  as prescribed.  Would like to resume them so new prescription sent to the pharmacy.    No other concerns at this time.   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    Malignant hyperthermia    Toxic multinodular goiter     Past Surgical History:  Procedure Laterality Date   ADENOIDECTOMY     CESAREAN SECTION     COLONOSCOPY WITH PROPOFOL  N/A 03/08/2016   Procedure: COLONOSCOPY WITH PROPOFOL ;  Surgeon: Lamar ONEIDA Holmes, MD;  Location: [facility] ENDOSCOPY;  Service: Endoscopy;  Laterality: N/A;   LAPAROTOMY  1984   OVARIAN CYST   TOTAL HIP ARTHROPLASTY Right 04/23/2021   Procedure: TOTAL HIP ARTHROPLASTY ANTERIOR APPROACH;  Surgeon: Kathlynn Sharper, MD;  Location: [facility] ORS;  Service: Orthopedics;  Laterality: Right;    Social History   Socioeconomic History   Marital status: Widowed    Spouse name: Not on file    Number of children: 1   Years of education: Not on file   Highest education level: High school graduate  Occupational History   Occupation: disability  Tobacco Use   Smoking status: Every Day    Current packs/day: 0.50    Average packs/day: 0.5 packs/day for 40.0 years (20.0 ttl pk-yrs)    Types: Cigarettes   Smokeless tobacco: Never  Vaping Use   Vaping status: Never Used  Substance and Sexual Activity   Alcohol use: No    Alcohol/week: 0.0 standard drinks of alcohol   Drug use: No   Sexual activity: Not on file  Other Topics Concern   Not on file  Social History Narrative   Not on file   Social Drivers of Health   Financial Resource Strain: Low Risk  (10/15/2020)   Overall Financial Resource Strain (CARDIA)    Difficulty of Paying Living Expenses: Not very hard  Food Insecurity: No Food Insecurity (12/10/2021)   Hunger Vital Sign    Worried About Running Out of Food in the Last Year: Never true    Ran Out of Food in the Last Year: Never true  Transportation Needs: No Transportation Needs (12/10/2021)   PRAPARE - Administrator, Civil Service (Medical): No    Lack of Transportation (Non-Medical): No  Physical Activity: Inactive (10/16/2020)   Exercise Vital Sign    Days of  Exercise per Week: 0 days    Minutes of Exercise per Session: 0 min  Stress: Stress Concern Present (12/10/2021)   [employer] of Occupational Health - Occupational Stress Questionnaire    Feeling of Stress : Rather much  Social Connections: Socially Isolated (10/16/2020)   Social Connection and Isolation Panel    Frequency of Communication with Friends and Family: Once a week    Frequency of Social Gatherings with Friends and Family: Never    Attends Religious Services: Never    Database Administrator or Organizations: No    Attends Banker Meetings: Never    Marital Status: Widowed  Intimate Partner Violence: Not At Risk (10/16/2020)   Humiliation, Afraid, Rape, and  Kick questionnaire    Fear of Current or Ex-Partner: No    Emotionally Abused: No    Physically Abused: No    Sexually Abused: No    Family History  Problem Relation Age of Onset   CVA Mother    Hypercholesterolemia Mother    Depression Mother    Rheum arthritis Mother    Deep vein thrombosis Mother    Bladder Cancer Father    Diverticulitis Father    Pulmonary embolism Father    Lung cancer Father    Deep vein thrombosis Sister    Colon polyps Sister    Atrial fibrillation Sister    Muscular dystrophy Sister    Cancer Sister        lung spreading all over   Diabetes Son        type 2    Allergies  Allergen Reactions   Anesthetics, Halogenated Other (See Comments)    Other reaction(s): Other (See Comments)   Ciprofloxacin     swelling   Methimazole     hives   Oxycodone -Acetaminophen  Nausea And Vomiting   Penicillins Other (See Comments)    RXN AS A CHILD    Outpatient Medications Prior to Visit  Medication Sig   b complex vitamins capsule Take 1 capsule by mouth daily.   cholecalciferol (VITAMIN D3) 25 MCG (1000 UNIT) tablet Take 1,000 Units by mouth daily.   famotidine  (PEPCID ) 20 MG tablet Take 1 tablet (20 mg total) by mouth at bedtime.   levothyroxine  (SYNTHROID ) 100 MCG tablet TAKE 1 TABLET BY MOUTH EVERY DAY   pantoprazole  (PROTONIX ) 20 MG tablet Take 20 mg by mouth daily.   rosuvastatin  (CRESTOR ) 5 MG tablet TAKE 1 TABLET (5 MG TOTAL) BY MOUTH DAILY.   pantoprazole  (PROTONIX ) 40 MG tablet Take 1 tablet (40 mg total) by mouth 2 (two) times daily before a meal for 14 days. (Patient not taking: Reported on 08/02/2024)   No facility-administered medications prior to visit.    Review of Systems  Constitutional: Negative.  Negative for chills, fever and malaise/fatigue.  HENT:  Positive for congestion and sore throat.   Eyes: Negative.  Negative for blurred vision and pain.  Respiratory: Negative.  Negative for cough and shortness of breath.    Cardiovascular: Negative.  Negative for chest pain, palpitations and leg swelling.  Gastrointestinal:  Positive for heartburn. Negative for abdominal pain, blood in stool, constipation, diarrhea, melena, nausea and vomiting.  Genitourinary: Negative.  Negative for dysuria, flank pain, frequency and urgency.  Musculoskeletal: Negative.  Negative for joint pain and myalgias.  Skin: Negative.   Neurological: Negative.  Negative for dizziness, tingling, sensory change, weakness and headaches.  Endo/Heme/Allergies: Negative.   Psychiatric/Behavioral:  Positive for depression. Negative for suicidal ideas. The patient  is nervous/anxious.        Objective:   BP (!) 152/76   Pulse 75   Ht 5' 2 (1.575 m)   Wt 142 lb 3.2 oz (64.5 kg)   SpO2 98%   BMI 26.01 kg/m   Vitals:   08/02/24 1357  BP: (!) 152/76  Pulse: 75  Height: 5' 2 (1.575 m)  Weight: 142 lb 3.2 oz (64.5 kg)  SpO2: 98%  BMI (Calculated): 26    Physical Exam Vitals and nursing note reviewed.  Constitutional:      Appearance: Normal appearance.  HENT:     Head: Normocephalic and atraumatic.     Nose: Nose normal.     Mouth/Throat:     Mouth: Mucous membranes are moist.     Pharynx: Oropharynx is clear.  Eyes:     Conjunctiva/sclera: Conjunctivae normal.     Pupils: Pupils are equal, round, and reactive to light.  Cardiovascular:     Rate and Rhythm: Normal rate and regular rhythm.     Pulses: Normal pulses.     Heart sounds: Normal heart sounds. No murmur heard. Pulmonary:     Effort: Pulmonary effort is normal.     Breath sounds: Normal breath sounds. No wheezing.  Abdominal:     General: Bowel sounds are normal.     Palpations: Abdomen is soft.     Tenderness: There is no abdominal tenderness. There is no right CVA tenderness or left CVA tenderness.  Musculoskeletal:        General: Normal range of motion.     Cervical back: Normal range of motion.     Right lower leg: No edema.     Left lower leg: No  edema.  Skin:    General: Skin is warm and dry.  Neurological:     General: No focal deficit present.     Mental Status: She is alert and oriented to person, place, and time.  Psychiatric:        Mood and Affect: Mood normal.        Behavior: Behavior normal.      No results found for any visits on 08/02/24.  Recent Results (from the past 2160 hours)  CBC with Differential     Status: None   Collection Time: 06/25/24  1:58 AM  Result Value Ref Range   WBC 9.9 4.0 - 10.5 K/uL   RBC 3.98 3.87 - 5.11 MIL/uL   Hemoglobin 12.8 12.0 - 15.0 g/dL   HCT 61.5 63.9 - 53.9 %   MCV 96.5 80.0 - 100.0 fL   MCH 32.2 26.0 - 34.0 pg   MCHC 33.3 30.0 - 36.0 g/dL   RDW 87.3 88.4 - 84.4 %   Platelets 315 150 - 400 K/uL   nRBC 0.0 0.0 - 0.2 %   Neutrophils Relative % 74 %   Neutro Abs 7.4 1.7 - 7.7 K/uL   Lymphocytes Relative 21 %   Lymphs Abs 2.1 0.7 - 4.0 K/uL   Monocytes Relative 4 %   Monocytes Absolute 0.4 0.1 - 1.0 K/uL   Eosinophils Relative 1 %   Eosinophils Absolute 0.1 0.0 - 0.5 K/uL   Basophils Relative 0 %   Basophils Absolute 0.0 0.0 - 0.1 K/uL   Immature Granulocytes 0 %   Abs Immature Granulocytes 0.04 0.00 - 0.07 K/uL    Comment: Performed at [facility], [address], [place], [place] [address]  Comprehensive metabolic panel     Status: Abnormal  Collection Time: 06/25/24  1:58 AM  Result Value Ref Range   Sodium 143 135 - 145 mmol/L   Potassium 4.3 3.5 - 5.1 mmol/L   Chloride 104 98 - 111 mmol/L   CO2 24 22 - 32 mmol/L   Glucose, Bld 114 (H) 70 - 99 mg/dL    Comment: Glucose reference range applies only to samples taken after fasting for at least 8 hours.   BUN 17 8 - 23 mg/dL   Creatinine, Ser 9.63 (L) 0.44 - 1.00 mg/dL   Calcium  9.2 8.9 - 10.3 mg/dL   Total Protein 7.5 6.5 - 8.1 g/dL   Albumin 3.9 3.5 - 5.0 g/dL   AST 32 15 - 41 U/L   ALT 41 0 - 44 U/L   Alkaline Phosphatase 67 38 - 126 U/L   Total Bilirubin 0.5 0.0 - 1.2 mg/dL   GFR, Estimated  >39 >39 mL/min    Comment: (NOTE) Calculated using the CKD-EPI Creatinine Equation (2021)    Anion gap 15 5 - 15    Comment: Performed at [facility], [address], [place], [place] [address]      Assessment & Plan:  Patient to continue with her PPI.  Remeron  and BuSpar  sent to the pharmacy.  Need to monitor her blood pressure closely and will need to start medication if needed.  Patient to proceed with her EGD as scheduled. Problem List Items Addressed This Visit     MDD (major depressive disorder)   Relevant Medications   mirtazapine  (REMERON ) 30 MG tablet   busPIRone  (BUSPAR ) 10 MG tablet   Muscular dystrophy (HCC)   Postablative hypothyroidism   Gastroesophageal reflux disease - Primary   Relevant Medications   pantoprazole  (PROTONIX ) 20 MG tablet   Mixed hyperlipidemia   GAD (generalized anxiety disorder)   Relevant Medications   mirtazapine  (REMERON ) 30 MG tablet   busPIRone  (BUSPAR ) 10 MG tablet   Other Visit Diagnoses       Needs flu shot       Relevant Orders   Influenza, MDCK, trivalent, PF(Flucelvax egg-free) (Completed)       Return in about 1 month (around 09/01/2024).   Total time spent: 30 minutes. This time includes review of previous notes and results and patient face to face interaction during today's visit.    FERNAND FREDY RAMAN, MD  08/02/2024   This document may have been prepared by [facility] Voice Recognition software and as such may include unintentional dictation errors.

## 2024-08-19 ENCOUNTER — Other Ambulatory Visit: Payer: Self-pay | Admitting: Cardiology

## 2024-08-24 ENCOUNTER — Other Ambulatory Visit: Payer: Self-pay | Admitting: Internal Medicine

## 2024-08-24 DIAGNOSIS — F411 Generalized anxiety disorder: Secondary | ICD-10-CM

## 2024-09-03 ENCOUNTER — Ambulatory Visit: Admitting: Internal Medicine

## 2024-09-04 ENCOUNTER — Ambulatory Visit: Admitting: Internal Medicine

## 2024-09-04 ENCOUNTER — Encounter: Payer: Self-pay | Admitting: Internal Medicine

## 2024-09-04 VITALS — BP 146/70 | HR 69 | Ht 62.0 in | Wt 142.2 lb

## 2024-09-04 DIAGNOSIS — F411 Generalized anxiety disorder: Secondary | ICD-10-CM

## 2024-09-04 DIAGNOSIS — K219 Gastro-esophageal reflux disease without esophagitis: Secondary | ICD-10-CM

## 2024-09-04 DIAGNOSIS — E890 Postprocedural hypothyroidism: Secondary | ICD-10-CM

## 2024-09-04 DIAGNOSIS — E782 Mixed hyperlipidemia: Secondary | ICD-10-CM

## 2024-09-04 DIAGNOSIS — E663 Overweight: Secondary | ICD-10-CM | POA: Insufficient documentation

## 2024-09-04 DIAGNOSIS — E538 Deficiency of other specified B group vitamins: Secondary | ICD-10-CM | POA: Insufficient documentation

## 2024-09-04 DIAGNOSIS — E559 Vitamin D deficiency, unspecified: Secondary | ICD-10-CM

## 2024-09-04 DIAGNOSIS — I10 Essential (primary) hypertension: Secondary | ICD-10-CM

## 2024-09-04 MED ORDER — BUSPIRONE HCL 15 MG PO TABS: 15.0000 mg | ORAL_TABLET | Freq: Two times a day (BID) | ORAL | 2 refills | Status: DC

## 2024-09-04 MED ORDER — LOSARTAN POTASSIUM 25 MG PO TABS: 25.0000 mg | ORAL_TABLET | Freq: Every day | ORAL | 2 refills | Status: AC

## 2024-09-04 NOTE — Progress Notes (Signed)
 Established Patient Office Visit  Subjective:  Patient ID: Tara Scott, female    DOB: 1960/11/15  Age: 63 y.o. MRN: [id number]  Chief Complaint  Patient presents with   Follow-up    1 month follow up    Patient is here today for FU. She reports feeling fairly well but has concerns to discuss.  Her BP is elevated at today's visit. Patient reports eating low sodium healthy diet. She endorses chronic anxiety. Will start losartan  25 mg daily for hypertension. Patient would like to increase her Buspar . Will send in new prescription for Buspar  15 mg twice daily. She has not started the Remeron  at this time. She reports anxiety about taking too many medications and possible interactions. Discussed slowly increasing Buspar  at this time and revisit second anxiety medication as needed in the future. Discussed future referral to psychiatry as needed if symptoms remain uncontrolled with trying multiple medications. Patient denies chest pain, shortness of breath, palpitations at this time.   Patient reports difficulty getting to sleep at night and some nights she reports not sleeping at all. She states when she does sleep she sleeps hard. She denies feeling tired. Patient is due for routine blood work today as she is fasting.   Patient is due for lung cancer screening due to active smoking. It was ordered 01/2024 but patient has not gotten it completed at this time. Will FU and get appointment scheduled. Patient is also due for mammogram and DEXA scan. Will discuss getting it completed when she returns.    No other concerns at this time.   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    Malignant hyperthermia    Toxic multinodular goiter     Past Surgical History:  Procedure Laterality Date   ADENOIDECTOMY     CESAREAN SECTION     COLONOSCOPY WITH PROPOFOL  N/A 03/08/2016   Procedure: COLONOSCOPY WITH PROPOFOL ;  Surgeon: Lamar ONEIDA Holmes, MD;  Location: [facility] ENDOSCOPY;  Service: Endoscopy;  Laterality: N/A;   LAPAROTOMY  1984   OVARIAN CYST   TOTAL HIP ARTHROPLASTY Right 04/23/2021   Procedure: TOTAL HIP ARTHROPLASTY ANTERIOR APPROACH;  Surgeon: Kathlynn Sharper, MD;  Location: [facility] ORS;  Service: Orthopedics;  Laterality: Right;    Social History   Socioeconomic History   Marital status: Widowed    Spouse name: Not on file   Number of children: 1   Years of education: Not on file   Highest education level: High school graduate  Occupational History   Occupation: disability  Tobacco Use   Smoking status: Every Day    Current packs/day: 0.50    Average packs/day: 0.5 packs/day for 40.0 years (20.0 ttl pk-yrs)    Types: Cigarettes   Smokeless tobacco: Never  Vaping Use   Vaping status: Never Used  Substance and Sexual Activity   Alcohol use: No    Alcohol/week: 0.0 standard drinks of alcohol   Drug use: No   Sexual activity: Not on file  Other Topics Concern   Not on file  Social History Narrative   Not on file   Social Drivers of Health   Tobacco  Use: High Risk (09/04/2024)   Patient History    Smoking Tobacco Use: Every Day    Smokeless Tobacco Use: Never    Passive Exposure: Not on file  Financial Resource Strain: Not on file  Food Insecurity: No Food Insecurity (12/10/2021)   Hunger Vital Sign    Worried About Running Out of Food in the Last Year: Never true    Ran Out of Food in the Last Year: Never true  Transportation Needs: No Transportation Needs (12/10/2021)   PRAPARE - Administrator, Civil Service (Medical): No    Lack of Transportation (Non-Medical): No  Physical Activity: Not on file  Stress:  Stress Concern Present (12/10/2021)   [employer] of Occupational Health - Occupational Stress Questionnaire    Feeling of Stress : Rather much  Social Connections: Not on file  Intimate Partner Violence: Not on file  Depression (PHQ2-9): High Risk (03/22/2024)   Depression (PHQ2-9)    PHQ-2 Score: 12  Alcohol Screen: Low Risk (12/10/2021)   Alcohol Screen    Last Alcohol Screening Score (AUDIT): 0  Housing: Low Risk (12/10/2021)   Housing    Last Housing Risk Score: 0  Utilities: Not on file  Health Literacy: Not on file    Family History  Problem Relation Age of Onset   CVA Mother    Hypercholesterolemia Mother    Depression Mother    Rheum arthritis Mother    Deep vein thrombosis Mother    Bladder Cancer Father    Diverticulitis Father    Pulmonary embolism Father    Lung cancer Father    Deep vein thrombosis Sister    Colon polyps Sister    Atrial fibrillation Sister    Muscular dystrophy Sister    Cancer Sister        lung spreading all over   Diabetes Son        type 2    Allergies[1]  Show/hide medication list[2]  Review of Systems  Constitutional: Negative.  Negative for chills, fever and malaise/fatigue.  HENT: Negative.  Negative for congestion and sore throat.   Eyes: Negative.  Negative for blurred vision and pain.  Respiratory: Negative.  Negative for cough and shortness of breath.   Cardiovascular: Negative.  Negative for chest pain, palpitations and leg swelling.  Gastrointestinal: Negative.  Negative for abdominal pain, blood in stool, constipation, diarrhea, heartburn, melena, nausea and vomiting.  Genitourinary: Negative.  Negative for dysuria, flank pain, frequency and urgency.  Musculoskeletal: Negative.  Negative for joint pain and myalgias.  Skin: Negative.   Neurological: Negative.  Negative for dizziness, tingling, sensory change, weakness and headaches.  Endo/Heme/Allergies: Negative.   Psychiatric/Behavioral:  Negative for depression  and suicidal ideas. The patient is nervous/anxious and has insomnia.        Objective:   BP (!) 146/70   Pulse 69   Ht 5' 2 (1.575 m)   Wt 142 lb 3.2 oz (64.5 kg)   SpO2 98%   BMI 26.01 kg/m   Vitals:   09/04/24 1412  BP: (!) 146/70  Pulse: 69  Height: 5' 2 (1.575 m)  Weight: 142 lb 3.2 oz (64.5 kg)  SpO2: 98%  BMI (Calculated): 26    Physical Exam Vitals and nursing note reviewed.  Constitutional:      Appearance: Normal appearance.  HENT:     Head: Normocephalic and atraumatic.     Nose: Nose normal.     Mouth/Throat:     Mouth: Mucous membranes  are moist.     Pharynx: Oropharynx is clear.  Eyes:     Conjunctiva/sclera: Conjunctivae normal.     Pupils: Pupils are equal, round, and reactive to light.  Cardiovascular:     Rate and Rhythm: Normal rate and regular rhythm.     Pulses: Normal pulses.     Heart sounds: Normal heart sounds. No murmur heard. Pulmonary:     Effort: Pulmonary effort is normal.     Breath sounds: Normal breath sounds. No wheezing.  Abdominal:     General: Bowel sounds are normal.     Palpations: Abdomen is soft.     Tenderness: There is no abdominal tenderness. There is no right CVA tenderness or left CVA tenderness.  Musculoskeletal:        General: Normal range of motion.     Cervical back: Normal range of motion.     Right lower leg: No edema.     Left lower leg: No edema.  Skin:    General: Skin is warm and dry.  Neurological:     General: No focal deficit present.     Mental Status: She is alert and oriented to person, place, and time.  Psychiatric:        Mood and Affect: Mood normal.        Behavior: Behavior normal.      No results found for any visits on 09/04/24.  Recent Results (from the past 2160 hours)  CBC with Differential     Status: None   Collection Time: 06/25/24  1:58 AM  Result Value Ref Range   WBC 9.9 4.0 - 10.5 K/uL   RBC 3.98 3.87 - 5.11 MIL/uL   Hemoglobin 12.8 12.0 - 15.0 g/dL   HCT 61.5  63.9 - 53.9 %   MCV 96.5 80.0 - 100.0 fL   MCH 32.2 26.0 - 34.0 pg   MCHC 33.3 30.0 - 36.0 g/dL   RDW 87.3 88.4 - 84.4 %   Platelets 315 150 - 400 K/uL   nRBC 0.0 0.0 - 0.2 %   Neutrophils Relative % 74 %   Neutro Abs 7.4 1.7 - 7.7 K/uL   Lymphocytes Relative 21 %   Lymphs Abs 2.1 0.7 - 4.0 K/uL   Monocytes Relative 4 %   Monocytes Absolute 0.4 0.1 - 1.0 K/uL   Eosinophils Relative 1 %   Eosinophils Absolute 0.1 0.0 - 0.5 K/uL   Basophils Relative 0 %   Basophils Absolute 0.0 0.0 - 0.1 K/uL   Immature Granulocytes 0 %   Abs Immature Granulocytes 0.04 0.00 - 0.07 K/uL    Comment: Performed at [facility], [address] [place], [place] [address]  Comprehensive metabolic panel     Status: Abnormal   Collection Time: 06/25/24  1:58 AM  Result Value Ref Range   Sodium 143 135 - 145 mmol/L   Potassium 4.3 3.5 - 5.1 mmol/L   Chloride 104 98 - 111 mmol/L   CO2 24 22 - 32 mmol/L   Glucose, Bld 114 (H) 70 - 99 mg/dL    Comment: Glucose reference range applies only to samples taken after fasting for at least 8 hours.   BUN 17 8 - 23 mg/dL   Creatinine, Ser 9.63 (L) 0.44 - 1.00 mg/dL   Calcium  9.2 8.9 - 10.3 mg/dL   Total Protein 7.5 6.5 - 8.1 g/dL   Albumin 3.9 3.5 - 5.0 g/dL   AST 32 15 - 41 U/L   ALT  41 0 - 44 U/L   Alkaline Phosphatase 67 38 - 126 U/L   Total Bilirubin 0.5 0.0 - 1.2 mg/dL   GFR, Estimated >39 >39 mL/min    Comment: (NOTE) Calculated using the CKD-EPI Creatinine Equation (2021)    Anion gap 15 5 - 15    Comment: Performed at [facility], [address], [place], [place] [address]      Assessment & Plan:  Start Losartan  25 mg daily. Increase Buspar  to 15 mg twice daily. Continue other medications and supplements as prescribed. Check routine blood work today and FU with patient on results. Problem List Items Addressed This Visit       Cardiovascular and Mediastinum   Essential hypertension, benign - Primary   Relevant Medications    losartan  (COZAAR ) 25 MG tablet     Digestive   Gastroesophageal reflux disease   Relevant Orders   CBC with Diff     Endocrine   Postablative hypothyroidism   Relevant Orders   TSH     Other   Vitamin D  deficiency, unspecified   Relevant Orders   Vitamin D  (25 hydroxy)   Mixed hyperlipidemia   Relevant Medications   losartan  (COZAAR ) 25 MG tablet   Other Relevant Orders   CMP14+EGFR   Lipid Panel w/o Chol/HDL Ratio   GAD (generalized anxiety disorder)   Relevant Medications   busPIRone  (BUSPAR ) 15 MG tablet   B12 deficiency   Relevant Orders   Vitamin B12   Overweight with body mass index (BMI) of 26 to 26.9 in adult    Return in about 2 weeks (around 09/18/2024).   Total time spent: 25 minutes. This time includes review of previous notes and results and patient face to face interaction during today's visit.    FERNAND FREDY RAMAN, MD  09/04/2024   This document may have been prepared by [facility] Voice Recognition software and as such may include unintentional dictation errors.     [1]  Allergies Allergen Reactions   Anesthetics, Halogenated Other (See Comments)    Other reaction(s): Other (See Comments)   Ciprofloxacin     swelling   Methimazole     hives   Oxycodone -Acetaminophen  Nausea And Vomiting   Penicillins Other (See Comments)    RXN AS A CHILD  [2]  Outpatient Medications Prior to Visit  Medication Sig   b complex vitamins capsule Take 1 capsule by mouth daily.   cholecalciferol (VITAMIN D3) 25 MCG (1000 UNIT) tablet Take 1,000 Units by mouth daily.   levothyroxine  (SYNTHROID ) 100 MCG tablet TAKE 1 TABLET BY MOUTH EVERY DAY   pantoprazole  (PROTONIX ) 20 MG tablet TAKE 1 TABLET BY MOUTH EVERY DAY   rosuvastatin  (CRESTOR ) 5 MG tablet TAKE 1 TABLET (5 MG TOTAL) BY MOUTH DAILY.   [DISCONTINUED] busPIRone  (BUSPAR ) 10 MG tablet TAKE 1 TABLET BY MOUTH TWICE A DAY   mirtazapine  (REMERON ) 30 MG tablet TAKE 1 TABLET BY MOUTH AT BEDTIME. (Patient not taking:  Reported on 09/04/2024)   [DISCONTINUED] famotidine  (PEPCID ) 20 MG tablet Take 1 tablet (20 mg total) by mouth at bedtime. (Patient not taking: Reported on 09/04/2024)   No facility-administered medications prior to visit.

## 2024-09-05 ENCOUNTER — Ambulatory Visit: Payer: Self-pay | Admitting: Internal Medicine

## 2024-09-05 DIAGNOSIS — E890 Postprocedural hypothyroidism: Secondary | ICD-10-CM

## 2024-09-05 DIAGNOSIS — E559 Vitamin D deficiency, unspecified: Secondary | ICD-10-CM

## 2024-09-05 LAB — CMP14+EGFR
ALT: 19 IU/L (ref 0–32)
AST: 22 IU/L (ref 0–40)
Albumin: 4.4 g/dL (ref 3.9–4.9)
Alkaline Phosphatase: 78 IU/L (ref 49–135)
BUN/Creatinine Ratio: 36 — ABNORMAL HIGH (ref 12–28)
BUN: 12 mg/dL (ref 8–27)
Bilirubin Total: 0.3 mg/dL (ref 0.0–1.2)
CO2: 25 mmol/L (ref 20–29)
Calcium: 9.5 mg/dL (ref 8.7–10.3)
Chloride: 104 mmol/L (ref 96–106)
Creatinine, Ser: 0.33 mg/dL — ABNORMAL LOW (ref 0.57–1.00)
Globulin, Total: 2.4 g/dL (ref 1.5–4.5)
Glucose: 82 mg/dL (ref 70–99)
Potassium: 4.3 mmol/L (ref 3.5–5.2)
Sodium: 144 mmol/L (ref 134–144)
Total Protein: 6.8 g/dL (ref 6.0–8.5)
eGFR: 116 mL/min/1.73 (ref >59)

## 2024-09-05 LAB — CBC WITH DIFFERENTIAL/PLATELET
Basophils Absolute: 0.1 x10E3/uL (ref 0.0–0.2)
Basos: 1 %
EOS (ABSOLUTE): 0.1 x10E3/uL (ref 0.0–0.4)
Eos: 1 %
Hematocrit: 40.5 % (ref 34.0–46.6)
Hemoglobin: 13.4 g/dL (ref 11.1–15.9)
Immature Grans (Abs): 0 x10E3/uL (ref 0.0–0.1)
Immature Granulocytes: 0 %
Lymphocytes Absolute: 1.5 x10E3/uL (ref 0.7–3.1)
Lymphs: 28 %
MCH: 32.4 pg (ref 26.6–33.0)
MCHC: 33.1 g/dL (ref 31.5–35.7)
MCV: 98 fL — ABNORMAL HIGH (ref 79–97)
Monocytes Absolute: 0.5 x10E3/uL (ref 0.1–0.9)
Monocytes: 9 %
Neutrophils Absolute: 3.4 x10E3/uL (ref 1.4–7.0)
Neutrophils: 61 %
Platelets: 236 x10E3/uL (ref 150–450)
RBC: 4.13 x10E6/uL (ref 3.77–5.28)
RDW: 12 % (ref 11.7–15.4)
WBC: 5.5 x10E3/uL (ref 3.4–10.8)

## 2024-09-05 LAB — VITAMIN B12: Vitamin B-12: 501 pg/mL (ref 232–1245)

## 2024-09-05 LAB — LIPID PANEL W/O CHOL/HDL RATIO
Cholesterol, Total: 172 mg/dL (ref 100–199)
HDL: 63 mg/dL (ref >39)
LDL Chol Calc (NIH): 95 mg/dL (ref 0–99)
Triglycerides: 76 mg/dL (ref 0–149)
VLDL Cholesterol Cal: 14 mg/dL (ref 5–40)

## 2024-09-05 LAB — TSH: TSH: 0.71 u[IU]/mL (ref 0.450–4.500)

## 2024-09-05 LAB — VITAMIN D 25 HYDROXY (VIT D DEFICIENCY, FRACTURES): Vit D, 25-Hydroxy: 26.1 ng/mL — ABNORMAL LOW (ref 30.0–100.0)

## 2024-09-05 NOTE — Telephone Encounter (Signed)
 Pt wants weekly Vitamin D  instead

## 2024-09-06 NOTE — Progress Notes (Signed)
 Patient notified

## 2024-09-18 ENCOUNTER — Encounter: Payer: Self-pay | Admitting: Internal Medicine

## 2024-09-18 ENCOUNTER — Ambulatory Visit (INDEPENDENT_AMBULATORY_CARE_PROVIDER_SITE_OTHER): Admitting: Internal Medicine

## 2024-09-18 VITALS — BP 108/68 | HR 80 | Ht 62.0 in | Wt 140.0 lb

## 2024-09-18 DIAGNOSIS — K219 Gastro-esophageal reflux disease without esophagitis: Secondary | ICD-10-CM | POA: Diagnosis not present

## 2024-09-18 DIAGNOSIS — I10 Essential (primary) hypertension: Secondary | ICD-10-CM | POA: Diagnosis not present

## 2024-09-18 DIAGNOSIS — Z1231 Encounter for screening mammogram for malignant neoplasm of breast: Secondary | ICD-10-CM

## 2024-09-18 DIAGNOSIS — F17200 Nicotine dependence, unspecified, uncomplicated: Secondary | ICD-10-CM | POA: Diagnosis not present

## 2024-09-18 DIAGNOSIS — E890 Postprocedural hypothyroidism: Secondary | ICD-10-CM

## 2024-09-18 DIAGNOSIS — Z860102 Personal history of hyperplastic colon polyps: Secondary | ICD-10-CM | POA: Insufficient documentation

## 2024-09-18 DIAGNOSIS — E782 Mixed hyperlipidemia: Secondary | ICD-10-CM

## 2024-09-18 DIAGNOSIS — F411 Generalized anxiety disorder: Secondary | ICD-10-CM

## 2024-09-18 NOTE — Progress Notes (Signed)
 "  Established Patient Office Visit  Subjective:  Patient ID: Tara Scott, female    DOB: 03-19-1961  Age: 63 y.o. MRN: [id number]  Chief Complaint  Patient presents with   Follow-up    2 week follow up    Patient is in for her follow-up today.  Her blood pressure looks much better now, she is taking losartan  25 mg/day.  Denies lightheadedness or dizziness, no nausea or vomiting.  Patient is also taking BuSpar  15 mg twice a day which is helping with her anxiety.  Reports that some days she may feel some moments of increased anxiety, advised to take an extra BuSpar  that today.  Patient is scheduled for EGD for next month.  She also has a personal history of colon polyps, and a family history of tubular adenomas.  Her last colonoscopy was in 2017 and was advised to get another one in 5 years, which she has delayed.  Will request the GI to get her colonoscopy done on the same day as her EGD if possible.  Another referral sent for that purpose. Today patient is also agreeable to get her mammogram scheduled. She continues to smoke cigarettes although she has cut back significantly.  She is due for a low-dose CT chest for lung cancer screening. Labs were done recently, results were discussed, essentially unremarkable.    No other concerns at this time.   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    Malignant hyperthermia    Toxic multinodular goiter     Past Surgical History:  Procedure Laterality Date   ADENOIDECTOMY     CESAREAN SECTION     COLONOSCOPY WITH PROPOFOL  N/A 03/08/2016   Procedure: COLONOSCOPY WITH PROPOFOL ;  Surgeon: Lamar ONEIDA Holmes, MD;  Location: [facility] ENDOSCOPY;  Service: Endoscopy;  Laterality: N/A;   LAPAROTOMY  1984   OVARIAN CYST   TOTAL HIP ARTHROPLASTY Right 04/23/2021   Procedure: TOTAL HIP ARTHROPLASTY ANTERIOR APPROACH;  Surgeon: Kathlynn Sharper, MD;  Location: [facility] ORS;  Service: Orthopedics;  Laterality: Right;    Social History   Socioeconomic History   Marital status: Widowed    Spouse name: Not on file   Number of children: 1   Years of education: Not on file   Highest education level: High school graduate  Occupational History   Occupation: disability  Tobacco Use   Smoking status: Every Day    Current packs/day: 0.50    Average packs/day: 0.5 packs/day for 40.0 years (20.0 ttl pk-yrs)    Types: Cigarettes   Smokeless tobacco: Never  Vaping Use   Vaping status: Never Used  Substance and Sexual Activity   Alcohol use: No    Alcohol/week: 0.0 standard drinks of alcohol   Drug use: No   Sexual activity: Not on file  Other Topics Concern   Not on file  Social History Narrative   Not on file   Social Drivers of Health   Tobacco Use: High Risk (09/18/2024)   Patient History    Smoking Tobacco Use: Every Day    Smokeless Tobacco Use: Never    Passive Exposure: Not on file  Financial  Resource Strain: Not on file  Food Insecurity: No Food Insecurity (12/10/2021)   Hunger Vital Sign    Worried About Running Out of Food in the Last Year: Never true    Ran Out of Food in the Last Year: Never true  Transportation Needs: No Transportation Needs (12/10/2021)   PRAPARE - Administrator, Civil Service (Medical): No    Lack of Transportation (Non-Medical): No  Physical Activity: Not on file  Stress: Stress Concern Present (12/10/2021)   [employer] of Occupational Health - Occupational Stress Questionnaire    Feeling of Stress : Rather much  Social Connections: Not on file  Intimate Partner Violence: Not on file  Depression (PHQ2-9): High Risk (03/22/2024)   Depression  (PHQ2-9)    PHQ-2 Score: 12  Alcohol Screen: Low Risk (12/10/2021)   Alcohol Screen    Last Alcohol Screening Score (AUDIT): 0  Housing: Low Risk (12/10/2021)   Housing    Last Housing Risk Score: 0  Utilities: Not on file  Health Literacy: Not on file    Family History  Problem Relation Age of Onset   CVA Mother    Hypercholesterolemia Mother    Depression Mother    Rheum arthritis Mother    Deep vein thrombosis Mother    Bladder Cancer Father    Diverticulitis Father    Pulmonary embolism Father    Lung cancer Father    Deep vein thrombosis Sister    Colon polyps Sister    Atrial fibrillation Sister    Muscular dystrophy Sister    Cancer Sister        lung spreading all over   Diabetes Son        type 2    Allergies[1]  Show/hide medication list[2]  Review of Systems  Constitutional: Negative.  Negative for chills, fever and malaise/fatigue.  HENT: Negative.  Negative for congestion and sore throat.   Eyes: Negative.  Negative for blurred vision and pain.  Respiratory: Negative.  Negative for cough and shortness of breath.   Cardiovascular: Negative.  Negative for chest pain, palpitations and leg swelling.  Gastrointestinal: Negative.  Negative for abdominal pain, blood in stool, constipation, diarrhea, heartburn, melena, nausea and vomiting.  Genitourinary: Negative.  Negative for dysuria, flank pain, frequency and urgency.  Musculoskeletal: Negative.  Negative for joint pain and myalgias.  Skin: Negative.   Neurological: Negative.  Negative for dizziness, tingling, sensory change, weakness and headaches.  Endo/Heme/Allergies: Negative.   Psychiatric/Behavioral: Negative.  Negative for depression and suicidal ideas. The patient is not nervous/anxious.        Objective:   BP 108/68   Pulse 80   Ht 5' 2 (1.575 m)   Wt 140 lb (63.5 kg)   SpO2 99%   BMI 25.61 kg/m   Vitals:   09/18/24 1439  BP: 108/68  Pulse: 80  Height: 5' 2 (1.575 m)  Weight: 140  lb (63.5 kg)  SpO2: 99%  BMI (Calculated): 25.6    Physical Exam Vitals and nursing note reviewed.  Constitutional:      Appearance: Normal appearance.  HENT:     Head: Normocephalic and atraumatic.     Nose: Nose normal.     Mouth/Throat:     Mouth: Mucous membranes are moist.     Pharynx: Oropharynx is clear.  Eyes:     Conjunctiva/sclera: Conjunctivae normal.     Pupils: Pupils are equal, round, and reactive to light.  Cardiovascular:     Rate and  Rhythm: Normal rate and regular rhythm.     Pulses: Normal pulses.     Heart sounds: Normal heart sounds. No murmur heard. Pulmonary:     Effort: Pulmonary effort is normal.     Breath sounds: Normal breath sounds. No wheezing.  Abdominal:     General: Bowel sounds are normal.     Palpations: Abdomen is soft.     Tenderness: There is no abdominal tenderness. There is no right CVA tenderness or left CVA tenderness.  Musculoskeletal:        General: Normal range of motion.     Cervical back: Normal range of motion.     Right lower leg: No edema.     Left lower leg: No edema.  Skin:    General: Skin is warm and dry.  Neurological:     General: No focal deficit present.     Mental Status: She is alert and oriented to person, place, and time.  Psychiatric:        Mood and Affect: Mood normal.        Behavior: Behavior normal.      No results found for any visits on 09/18/24.  Recent Results (from the past 2160 hours)  CBC with Differential     Status: None   Collection Time: 06/25/24  1:58 AM  Result Value Ref Range   WBC 9.9 4.0 - 10.5 K/uL   RBC 3.98 3.87 - 5.11 MIL/uL   Hemoglobin 12.8 12.0 - 15.0 g/dL   HCT 61.5 63.9 - 53.9 %   MCV 96.5 80.0 - 100.0 fL   MCH 32.2 26.0 - 34.0 pg   MCHC 33.3 30.0 - 36.0 g/dL   RDW 87.3 88.4 - 84.4 %   Platelets 315 150 - 400 K/uL   nRBC 0.0 0.0 - 0.2 %   Neutrophils Relative % 74 %   Neutro Abs 7.4 1.7 - 7.7 K/uL   Lymphocytes Relative 21 %   Lymphs Abs 2.1 0.7 - 4.0 K/uL    Monocytes Relative 4 %   Monocytes Absolute 0.4 0.1 - 1.0 K/uL   Eosinophils Relative 1 %   Eosinophils Absolute 0.1 0.0 - 0.5 K/uL   Basophils Relative 0 %   Basophils Absolute 0.0 0.0 - 0.1 K/uL   Immature Granulocytes 0 %   Abs Immature Granulocytes 0.04 0.00 - 0.07 K/uL    Comment: Performed at [facility], [address] [place], [place] [address]  Comprehensive metabolic panel     Status: Abnormal   Collection Time: 06/25/24  1:58 AM  Result Value Ref Range   Sodium 143 135 - 145 mmol/L   Potassium 4.3 3.5 - 5.1 mmol/L   Chloride 104 98 - 111 mmol/L   CO2 24 22 - 32 mmol/L   Glucose, Bld 114 (H) 70 - 99 mg/dL    Comment: Glucose reference range applies only to samples taken after fasting for at least 8 hours.   BUN 17 8 - 23 mg/dL   Creatinine, Ser 9.63 (L) 0.44 - 1.00 mg/dL   Calcium  9.2 8.9 - 10.3 mg/dL   Total Protein 7.5 6.5 - 8.1 g/dL   Albumin 3.9 3.5 - 5.0 g/dL   AST 32 15 - 41 U/L   ALT 41 0 - 44 U/L   Alkaline Phosphatase 67 38 - 126 U/L   Total Bilirubin 0.5 0.0 - 1.2 mg/dL   GFR, Estimated >39 >39 mL/min    Comment: (NOTE) Calculated using the CKD-EPI Creatinine  Equation (2021)    Anion gap 15 5 - 15    Comment: Performed at [facility], [address] [place], [place] [address]  [id number]     Status: Abnormal   Collection Time: 09/04/24  3:15 PM  Result Value Ref Range   Glucose 82 70 - 99 mg/dL   BUN 12 8 - 27 mg/dL   Creatinine, Ser 9.66 (L) 0.57 - 1.00 mg/dL   eGFR 883 >40 fO/fpw/8.26   BUN/Creatinine Ratio 36 (H) 12 - 28   Sodium 144 134 - 144 mmol/L   Potassium 4.3 3.5 - 5.2 mmol/L   Chloride 104 96 - 106 mmol/L   CO2 25 20 - 29 mmol/L   Calcium  9.5 8.7 - 10.3 mg/dL   Total Protein 6.8 6.0 - 8.5 g/dL   Albumin 4.4 3.9 - 4.9 g/dL   Globulin, Total 2.4 1.5 - 4.5 g/dL   Bilirubin Total 0.3 0.0 - 1.2 mg/dL   Alkaline Phosphatase 78 49 - 135 IU/L   AST 22 0 - 40 IU/L   ALT 19 0 - 32 IU/L  Lipid Panel w/o Chol/HDL Ratio      Status: None   Collection Time: 09/04/24  3:15 PM  Result Value Ref Range   Cholesterol, Total 172 100 - 199 mg/dL   Triglycerides 76 0 - 149 mg/dL   HDL 63 >60 mg/dL   VLDL Cholesterol Cal 14 5 - 40 mg/dL   LDL Chol Calc (NIH) 95 0 - 99 mg/dL  CBC with Diff     Status: Abnormal   Collection Time: 09/04/24  3:15 PM  Result Value Ref Range   WBC 5.5 3.4 - 10.8 x10E3/uL   RBC 4.13 3.77 - 5.28 x10E6/uL   Hemoglobin 13.4 11.1 - 15.9 g/dL   Hematocrit 59.4 65.9 - 46.6 %   MCV 98 (H) 79 - 97 fL   MCH 32.4 26.6 - 33.0 pg   MCHC 33.1 31.5 - 35.7 g/dL   RDW 87.9 88.2 - 84.5 %   Platelets 236 150 - 450 x10E3/uL   Neutrophils 61 Not Estab. %   Lymphs 28 Not Estab. %   Monocytes 9 Not Estab. %   Eos 1 Not Estab. %   Basos 1 Not Estab. %   Neutrophils Absolute 3.4 1.4 - 7.0 x10E3/uL   Lymphocytes Absolute 1.5 0.7 - 3.1 x10E3/uL   Monocytes Absolute 0.5 0.1 - 0.9 x10E3/uL   EOS (ABSOLUTE) 0.1 0.0 - 0.4 x10E3/uL   Basophils Absolute 0.1 0.0 - 0.2 x10E3/uL   Immature Granulocytes 0 Not Estab. %   Immature Grans (Abs) 0.0 0.0 - 0.1 x10E3/uL  TSH     Status: None   Collection Time: 09/04/24  3:15 PM  Result Value Ref Range   TSH 0.710 0.450 - 4.500 uIU/mL  Vitamin D  (25 hydroxy)     Status: Abnormal   Collection Time: 09/04/24  3:15 PM  Result Value Ref Range   Vit D, 25-Hydroxy 26.1 (L) 30.0 - 100.0 ng/mL    Comment: Vitamin D  deficiency has been defined by the Institute of Medicine and an Endocrine Society practice guideline as a level of serum 25-OH vitamin D  less than 20 ng/mL (1,2). The Endocrine Society went on to further define vitamin D  insufficiency as a level between 21 and 29 ng/mL (2). 1. IOM (Institute of Medicine). 2010. Dietary reference    intakes for calcium  and D. [place]: The    [employer]. 2. Holick MF, Binkley  [place], Bischoff-Ferrari HA, et al.    Evaluation, treatment, and prevention of vitamin D     deficiency: an Endocrine Society clinical  practice    guideline. JCEM. 2011 Jul; 96(7):1911-30.   Vitamin B12     Status: None   Collection Time: 09/04/24  3:15 PM  Result Value Ref Range   Vitamin B-12 501 232 - 1,245 pg/mL      Assessment & Plan:  Continue current medications.  Schedule mammogram, low-dose CT chest, colonoscopy. Monitor blood pressure. Problem List Items Addressed This Visit       Cardiovascular and Mediastinum   Essential hypertension, benign - Primary     Digestive   Gastroesophageal reflux disease     Endocrine   Postablative hypothyroidism     Other   Nicotine dependence, uncomplicated   Relevant Orders   CT CHEST LUNG CA SCREEN LOW DOSE W/O CM   Mixed hyperlipidemia   GAD (generalized anxiety disorder)   Personal history of hyperplastic colon polyps   Relevant Orders   Ambulatory referral to Gastroenterology   Other Visit Diagnoses       Breast cancer screening by mammogram       Relevant Orders   MM 3D SCREENING MAMMOGRAM BILATERAL BREAST       Return in about 2 months (around 11/17/2024).   Total time spent: 30 minutes. This time includes review of previous notes and results and patient face to face interaction during today's visit.    FERNAND FREDY RAMAN, MD  09/18/2024   This document may have been prepared by [facility] Voice Recognition software and as such may include unintentional dictation errors.     [1]  Allergies Allergen Reactions   Anesthetics, Halogenated Other (See Comments)    Other reaction(s): Other (See Comments)   Ciprofloxacin     swelling   Methimazole     hives   Oxycodone -Acetaminophen  Nausea And Vomiting   Penicillins Other (See Comments)    RXN AS A CHILD  [2]  Outpatient Medications Prior to Visit  Medication Sig   b complex vitamins capsule Take 1 capsule by mouth daily.   busPIRone  (BUSPAR ) 15 MG tablet Take 1 tablet (15 mg total) by mouth 2 (two) times daily.   Cholecalciferol (VITAMIN D3) 1.25 MG (50000 UT) CAPS Take 1 capsule (1.25 mg  total) by mouth once a week.   levothyroxine  (SYNTHROID ) 100 MCG tablet TAKE 1 TABLET BY MOUTH EVERY DAY   losartan  (COZAAR ) 25 MG tablet Take 1 tablet (25 mg total) by mouth daily.   pantoprazole  (PROTONIX ) 20 MG tablet TAKE 1 TABLET BY MOUTH EVERY DAY   rosuvastatin  (CRESTOR ) 5 MG tablet TAKE 1 TABLET (5 MG TOTAL) BY MOUTH DAILY.   mirtazapine  (REMERON ) 30 MG tablet TAKE 1 TABLET BY MOUTH AT BEDTIME. (Patient not taking: Reported on 09/18/2024)   No facility-administered medications prior to visit.   "

## 2024-09-21 ENCOUNTER — Telehealth: Payer: Self-pay | Admitting: Internal Medicine

## 2024-09-21 NOTE — Telephone Encounter (Signed)
 Patient left VM stating she has a question about her labs. Did not state what her question is.

## 2024-09-27 ENCOUNTER — Other Ambulatory Visit: Payer: Self-pay | Admitting: Internal Medicine

## 2024-09-27 DIAGNOSIS — F411 Generalized anxiety disorder: Secondary | ICD-10-CM

## 2024-10-01 ENCOUNTER — Telehealth: Payer: Self-pay

## 2024-10-01 MED ORDER — NA SULFATE-K SULFATE-MG SULF 17.5-3.13-1.6 GM/177ML PO SOLN: 354.0000 mL | Freq: Once | ORAL | 0 refills | Status: AC

## 2024-10-01 NOTE — Telephone Encounter (Signed)
 Patient called to reschedule her procedure since she has URI at the moment. Her procedure had been rescheduled to 10/22/2024 at [facility] with Dr. Jinny. Trish at endo unit was also informed.

## 2024-10-01 NOTE — Addendum Note (Signed)
 Addended by: JODIE HEADINGS on: 10/01/2024 02:14 PM   Modules accepted: Orders

## 2024-10-19 ENCOUNTER — Other Ambulatory Visit: Payer: Self-pay | Admitting: Family Medicine

## 2024-10-22 ENCOUNTER — Encounter: Admission: RE | Payer: Self-pay | Source: Home / Self Care

## 2024-10-22 ENCOUNTER — Ambulatory Visit: Admission: RE | Admit: 2024-10-22 | Source: Home / Self Care | Admitting: Gastroenterology

## 2024-11-19 ENCOUNTER — Ambulatory Visit: Admitting: Internal Medicine
